# Patient Record
Sex: Male | Born: 1935 | Race: White | Hispanic: Yes | Smoking: Never smoker
Health system: Southern US, Community
[De-identification: ages and names within clinical notes are randomized; demographics above are authoritative.]

## PROBLEM LIST (undated history)

## (undated) DIAGNOSIS — I10 Essential (primary) hypertension: Secondary | ICD-10-CM

## (undated) DIAGNOSIS — I2489 Other forms of acute ischemic heart disease: Secondary | ICD-10-CM

## (undated) DIAGNOSIS — J986 Disorders of diaphragm: Secondary | ICD-10-CM

## (undated) DIAGNOSIS — J9621 Acute and chronic respiratory failure with hypoxia: Secondary | ICD-10-CM

## (undated) DIAGNOSIS — E785 Hyperlipidemia, unspecified: Secondary | ICD-10-CM

## (undated) DIAGNOSIS — R338 Other retention of urine: Secondary | ICD-10-CM

## (undated) DIAGNOSIS — J1100 Influenza due to unidentified influenza virus with unspecified type of pneumonia: Secondary | ICD-10-CM

## (undated) DIAGNOSIS — R071 Chest pain on breathing: Secondary | ICD-10-CM

## (undated) DIAGNOSIS — M1990 Unspecified osteoarthritis, unspecified site: Secondary | ICD-10-CM

## (undated) DIAGNOSIS — G9340 Encephalopathy, unspecified: Secondary | ICD-10-CM

## (undated) DIAGNOSIS — I248 Other forms of acute ischemic heart disease: Secondary | ICD-10-CM

## (undated) DIAGNOSIS — Z955 Presence of coronary angioplasty implant and graft: Secondary | ICD-10-CM

## (undated) DIAGNOSIS — N400 Enlarged prostate without lower urinary tract symptoms: Secondary | ICD-10-CM

## (undated) DIAGNOSIS — J449 Chronic obstructive pulmonary disease, unspecified: Secondary | ICD-10-CM

## (undated) DIAGNOSIS — J189 Pneumonia, unspecified organism: Secondary | ICD-10-CM

## (undated) DIAGNOSIS — I2510 Atherosclerotic heart disease of native coronary artery without angina pectoris: Secondary | ICD-10-CM

## (undated) DIAGNOSIS — I119 Hypertensive heart disease without heart failure: Secondary | ICD-10-CM

## (undated) DIAGNOSIS — R0600 Dyspnea, unspecified: Secondary | ICD-10-CM

## (undated) DIAGNOSIS — J209 Acute bronchitis, unspecified: Secondary | ICD-10-CM

## (undated) HISTORY — DX: Pneumonia, unspecified organism: J18.9

## (undated) HISTORY — DX: Other forms of acute ischemic heart disease: I24.89

## (undated) HISTORY — DX: Other retention of urine: R33.8

## (undated) HISTORY — DX: Chest pain on breathing: R07.1

## (undated) HISTORY — DX: Encephalopathy, unspecified: G93.40

## (undated) HISTORY — DX: Disorders of diaphragm: J98.6

## (undated) HISTORY — DX: Hypertensive heart disease without heart failure: I11.9

## (undated) HISTORY — DX: Hyperlipidemia, unspecified: E78.5

## (undated) HISTORY — DX: Atherosclerotic heart disease of native coronary artery without angina pectoris: I25.10

## (undated) HISTORY — DX: Acute bronchitis, unspecified: J20.9

## (undated) HISTORY — DX: Acute and chronic respiratory failure with hypoxia: J96.21

## (undated) HISTORY — DX: Presence of coronary angioplasty implant and graft: Z95.5

## (undated) HISTORY — DX: Benign prostatic hyperplasia without lower urinary tract symptoms: N40.0

## (undated) HISTORY — DX: Influenza due to unidentified influenza virus with unspecified type of pneumonia: J11.00

## (undated) HISTORY — DX: Other forms of acute ischemic heart disease: I24.8

## (undated) HISTORY — DX: Essential (primary) hypertension: I10

---

## 2001-05-29 ENCOUNTER — Emergency Department (HOSPITAL_COMMUNITY): Admission: EM | Admit: 2001-05-29 | Discharge: 2001-05-29 | Payer: Self-pay | Admitting: Emergency Medicine

## 2001-06-05 ENCOUNTER — Emergency Department (HOSPITAL_COMMUNITY): Admission: EM | Admit: 2001-06-05 | Discharge: 2001-06-05 | Payer: Self-pay | Admitting: Emergency Medicine

## 2001-12-24 ENCOUNTER — Inpatient Hospital Stay (HOSPITAL_COMMUNITY): Admission: EM | Admit: 2001-12-24 | Discharge: 2001-12-28 | Payer: Self-pay | Admitting: *Deleted

## 2001-12-26 ENCOUNTER — Encounter: Payer: Self-pay | Admitting: Cardiovascular Disease

## 2002-01-20 ENCOUNTER — Ambulatory Visit (HOSPITAL_COMMUNITY): Admission: RE | Admit: 2002-01-20 | Discharge: 2002-01-20 | Payer: Self-pay | Admitting: Critical Care Medicine

## 2002-01-20 ENCOUNTER — Encounter: Payer: Self-pay | Admitting: Critical Care Medicine

## 2002-01-24 ENCOUNTER — Ambulatory Visit: Admission: RE | Admit: 2002-01-24 | Discharge: 2002-01-24 | Payer: Self-pay | Admitting: Critical Care Medicine

## 2002-03-23 ENCOUNTER — Inpatient Hospital Stay (HOSPITAL_COMMUNITY): Admission: RE | Admit: 2002-03-23 | Discharge: 2002-03-24 | Payer: Self-pay | Admitting: Neurosurgery

## 2002-03-23 ENCOUNTER — Encounter: Payer: Self-pay | Admitting: Neurosurgery

## 2002-03-25 ENCOUNTER — Emergency Department (HOSPITAL_COMMUNITY): Admission: EM | Admit: 2002-03-25 | Discharge: 2002-03-25 | Payer: Self-pay | Admitting: Emergency Medicine

## 2002-10-21 ENCOUNTER — Ambulatory Visit (HOSPITAL_COMMUNITY): Admission: RE | Admit: 2002-10-21 | Discharge: 2002-10-22 | Payer: Self-pay | Admitting: Cardiovascular Disease

## 2004-12-14 ENCOUNTER — Emergency Department (HOSPITAL_COMMUNITY): Admission: EM | Admit: 2004-12-14 | Discharge: 2004-12-14 | Payer: Self-pay | Admitting: Emergency Medicine

## 2006-07-19 ENCOUNTER — Emergency Department (HOSPITAL_COMMUNITY): Admission: EM | Admit: 2006-07-19 | Discharge: 2006-07-19 | Payer: Self-pay | Admitting: Emergency Medicine

## 2010-03-15 ENCOUNTER — Ambulatory Visit: Payer: Self-pay | Admitting: Cardiovascular Disease

## 2010-08-14 ENCOUNTER — Telehealth: Payer: Self-pay | Admitting: Cardiovascular Disease

## 2010-08-19 ENCOUNTER — Telehealth: Payer: Self-pay | Admitting: Cardiology

## 2010-09-09 ENCOUNTER — Ambulatory Visit (INDEPENDENT_AMBULATORY_CARE_PROVIDER_SITE_OTHER): Payer: BC Managed Care – PPO | Admitting: Cardiovascular Disease

## 2010-09-09 DIAGNOSIS — E7800 Pure hypercholesterolemia, unspecified: Secondary | ICD-10-CM

## 2010-09-09 DIAGNOSIS — Z9861 Coronary angioplasty status: Secondary | ICD-10-CM

## 2010-09-19 ENCOUNTER — Ambulatory Visit: Payer: Self-pay | Admitting: Cardiovascular Disease

## 2010-12-06 NOTE — Op Note (Signed)
[facility]  Patient:    Angel [name], Angel [name] Visit Number: [id number] MRN: [id number]          Service Type: DSU Location: CARD Attending Physician:  Caleb Popp Dictated by:   Charlcie Cradle Delford Field, M.D. [facility] Proc. Date: 01/24/02 Admit Date:  01/24/2002 Discharge Date: 01/24/2002                             Operative Report  PROCEDURE:  Bronchoscopy.  INDICATION:  Right hemidiaphragm elevation, evaluate for endobronchial lesion.  OPERATOR:  Charlcie Cradle. Delford Field, M.D. [facility]  ANESTHESIA:  1% Xylocaine local.  PREOPERATIVE MEDICATIONS:  Versed 4 mg IV push, Demerol 40 mg IV push.  DESCRIPTION OF PROCEDURE:  The Pentax video bronchoscope was introduced through the right naris.  The upper airways were visualized and were unremarkable.  The entire tracheobronchial tree was visualized.  There were no endobronchial lesions seen; however, there was dynamic collapse of the right lower lobe bronchus intermedius and right lower lobe orifices with exhalation. No specimens were obtained.  COMPLICATIONS:  None.  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  No endobronchial lesions seen.  RECOMMENDATIONS:  Followup in the office in one weeks time. Dictated by:   Charlcie Cradle Delford Field, M.D. [facility] Attending Physician:  Caleb Popp DD:  01/24/02 TD:  01/26/02 Job: [id number]

## 2010-12-06 NOTE — Op Note (Signed)
[place]. [facility]  Patient:    Angel [name], Angel [name] Visit Number: [id number] MRN: [id number]          Service Type: MED Location: CCUB 2907 01 Attending Physician:  Koren Bound Dictated by:   Alvia Grove., M.D. Proc. Date: 12/24/01 Admit Date:  12/24/2001 Discharge Date: 12/28/2001   CC:         Charlcie Cradle. Delford Field, M.D. [facility]  Teena Irani. Arlyce Dice, M.D.   Operative Report  DISCHARGE DIAGNOSES: 1. Subendocardial myocardial infarction-anterior wall. 2. Diabetes mellitus. 3. Paralysis of the left hemidiaphragm.  DISCHARGE MEDICATIONS: 1. Enteric-coated aspirin 325 mg a day. 2. Plavix 75 mg a day. 3. Glyburide 5 mg a day. 4. Flomax 0.4 mg a day. 5. Glucophage 500 mg at night-He has to resume on Thursday. 6. Nitroglycerin 0.4 sublingually as needed. 7. Atenolol 25 mg a day.  DISPOSITION:  The patient is to see Dr. Elease Hashimoto in one week.  We will set him up to see a pulmonologist in the near future.  HISTORY OF PRESENT ILLNESS:  Mr. Angel [name] is a 75 -year-old gentleman who presented to the office on December 24, 2001 with chest pain and anterior wall T-wave inversions on EKG.  He was admitted to the hospital for further evaluation.  Please see dictated History and Physical for further details.  HOSPITAL COURSE: #1 - CORONARY ARTERY DISEASE:  Patient was found to have positive cardiac enzymes on admission.  The following day he underwent heart catheterization. He was found to have an extremely tight and calcified proximal LAD lesion. Several days later, the patient underwent rotational arthrectomy and PTCA of the proximal LAD.  The patient tolerated the procedure well and he did not have any episodes of chest pain.  He only had one significant lesion.  He only has minor luminal irregularities in the other coronary arteries.  During the heart catheterization, we did fluoroscopy of the diaphragms.  We performed the sniff test.  He was found to have  paradoxical motion of the left-hemidiaphragm as well as paralysis.  Patient will be discharged on the above-noted medications.  He will see Dr. Elease Hashimoto for evaluation of this chest pain.  We will check fasting lipid profile in the office.  #2 - PARALYZED LEFT HEMIDIAPHRAGM:  The patient had a sniff test in the catheterization laboratory as described above.  He had a CT scan of the chest which revealed no mediastinal adenopathy. He does not report any history of trauma and no motor vehicle accidents.  We will send him to pulmonologist for further evaluation.  There did appear to be some scarring and calcification on the diaphragm.  This is of unknown significance.  #3 - DIABETES MELLITUS:  His glucose levels have been fairly well controlled here in the hospital.  He will return to see Dr. Arlyce Dice for further evaluation of his diabetes mellitus.  DISPOSITION:  The patient had a small to moderate size myocardial infarction. He will return to work on January 10, 2002. Dictated by:   Alvia Grove., M.D. Attending Physician:  Koren Bound DD:  12/28/01 TD:  12/29/01 Job: [id number]

## 2010-12-06 NOTE — H&P (Signed)
NAMEJOSEDE, Angel [name]                              ACCOUNT NO.:  [id number]   MEDICAL RECORD NO.:  [id number]                   PATIENT TYPE:  OIB   LOCATION:                                       FACILITY:  [facility]   PHYSICIAN:  Vesta Mixer, M.D.              DATE OF BIRTH:  July 23, 1935   DATE OF ADMISSION:  10/21/2002  DATE OF DISCHARGE:                                HISTORY & PHYSICAL   HISTORY OF PRESENT ILLNESS:  The patient is a middle-aged gentleman with a  history of coronary artery disease.  He is status post anterior wall  myocardial infarction.  He is status post rotational atherectomy and PTCA of  his LAD last year.  At that time, he was also noted to have a paralyzed left  hemidiaphragm.  Further workup revealed that he had spinal stenosis and he  has had spinal surgery to release the nerve entrapment.   The patient presented today with his sister, who helped interpret.  He  apparently has been having more and more episodes of chest pain.  These  episodes of chest pain are anginal in character.  He has to take  nitroglycerin with as little exercise as just going out to the mailbox.  He  has not had any significant episodes of chest pain at rest.   CURRENT MEDICATIONS:  1. Glucophage 500 mg a day.  2. Glucotrol 5 mg a day.  3. Flomax 0.4 mg a day.  4. Atenolol 25 mg a day.  5. Crestor 10 mg a day.   ALLERGIES:  He has no known drug allergies.   PAST MEDICAL HISTORY:  1. Diabetes mellitus.  2. Coronary artery disease.  3. Hyperlipidemia.  4. Borderline prostatic hypertrophy.   SOCIAL HISTORY:  The patient does not smoke, he does not drink alcohol.   FAMILY HISTORY:  His family history is noncontributory and is noted in the  previous notes.   REVIEW OF SYSTEMS:  His review of systems is essentially normal.   PHYSICAL EXAMINATION:  GENERAL:  He is a middle-aged Hispanic gentleman in  no acute distress.  His weight is 166.  VITAL SIGNS:  His blood pressure is  130/72 with a heart rate of 72.  HEENT/NECK:  Exam reveals 2+ carotids.  He has no bruits.  There is no JVD.  No thyromegaly.  LUNGS:  Lungs are clear to auscultation.  HEART:  Regular rate, S1 and S2.  He has no murmurs, gallops or rubs.  ABDOMEN:  Exam reveals good bowel sounds and is nontender.  EXTREMITIES:  He has no clubbing, cyanosis, or edema.  NEUROLOGIC:  Exam is nonfocal.   ASSESSMENT AND RECOMMENDATION:  The patient presents with episodes of  unstable angina.  He has significant episodes of chest pain with very  minimal exertion.  I have recommended that we proceed with repeat heart  catheterization.  We have discussed the risks, benefits and options of heart  catheterization.  He understands and agrees to proceed.  We will continue  with his same medications otherwise.                                               Vesta Mixer, M.D.    PJN/MEDQ  D:  10/17/2002  T:  10/18/2002  Job:  [id number]   cc:   Teena Irani. Arlyce Dice, M.D.  [address]  [place]  [place] [address]  Fax: [phone]

## 2010-12-06 NOTE — H&P (Signed)
[place]. [name]  Patient:    Angel [name], Angel [name] Visit Number: [id number] MRN: [id number]          Service Type: MED Location: CCUB 2907 01 Attending Physician:  Koren Bound Dictated by:   Alvia Grove., M.D. Admit Date:  12/24/2001   CC:         Tammy R. Collins Scotland, M.D.   History and Physical  HISTORY OF PRESENT ILLNESS:  Angel [name] is a 75 year old Hispanic gentleman with a history of diabetes mellitus.  He presents with new onset angina.  He is admitted for evaluation and management of his angina.  The patient has a two year history of diabetes mellitus.  He has been seen in various urgent care settings.  For the past several days he has had intermittent episodes of angina.  These episodes of angina have been associated with diaphoresis and dyspnea.  He presented to [facility] for further evaluation.  These episodes have occurred with his usual day of activities.  They typically last for 15-20 minutes.  He has not had any syncopal episodes.  He has been able to do any sort of activities because of his chest pains.  At [facility] he was found to have T-wave inversions in the anterior septal leads and was referred to our office for further evaluation.  CURRENT MEDICATIONS: 1. Glucophage 500 mg a day. 2. Glucotrol 5 mg a day. 3. Flomax 0.4 mg a day.  ALLERGIES:  He has no known drug allergies.  PAST MEDICAL HISTORY:  Diabetes mellitus.  SOCIAL HISTORY:  The patient is a nonsmoker.  He does not drink alcohol.  FAMILY HISTORY:  Not significant for any cardiac problems.  REVIEW OF SYSTEMS:  Was reviewed and is essentially negative.  PHYSICAL EXAMINATION:  GENERAL:  He is a middle-aged gentleman in no acute distress.  VITAL SIGNS:  His weight is 175.  His blood pressure is 108/80 with heart rate of 80.  HEENT/NECK:  Revealed 2+ carotids.  He has no bruits.  There is no JVD.  LUNGS:  Reveals  markedly decreased breath sounds on the left side.  His right side is fairly clear.  HEART:  Regular rate, S1, S2.  He has no murmurs, gallops, or rubs.  ABDOMEN:  Reveals good bowel sounds and is nontender.  He has no bruits.  EXTREMITIES:  He has good pulses.  He has no clubbing, cyanosis, or edema.  NEUROLOGIC:  Nonfocal.  His cranial nerves II-XII are intact.  Motor and sensory function are intact.  LABORATORY:  His EKG reveals normal sinus rhythm.  He has T-wave inversions in the anterior leads.  His chest x-ray reveals markedly elevated left hemidiaphragm.  He has cardiomegaly and appears to have enlarged right atrium and right ventricle.  The patient presents with new onset angina.  He has an abnormal EKG consistent with an anteroseptal ischemia or infarction.  He also has some cardiomegaly on chest x-ray as well as a markedly elevated left hemidiaphragm.  We will admit him to the hospital and place him on heparin.  We will proceed with heart catheterization tomorrow morning.  His cardiac enlargement is somewhat of a curiosity.  He does not have any peripheral edema and does not have any significant murmurs to suggest that he has right-sided overload.  He also does not appear to be significantly rotated in a chest x-ray film.  We will consider getting an echocardiogram in the near future. Dictated by:  Alvia Grove., M.D. Attending Physician:  Koren Bound DD:  12/24/01 TD:  12/26/01 Job: [id number]

## 2010-12-06 NOTE — Op Note (Signed)
NAMEKEILON, RESSEL                              ACCOUNT NO.:  [id number]   MEDICAL RECORD NO.:  [id number]                   PATIENT TYPE:  INP   LOCATION:  3007                                 FACILITY:  [facility]   PHYSICIAN:  Hewitt Shorts, M.D.            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   POSTOPERATIVE DIAGNOSIS:  C3-4 cervical stenosis, spondylosis, degenerative  disk disease, and myelopathy.   PROCEDURE:  C3-4 anterior cervical diskectomy and arthrodesis with iliac  crest allograft and Trinica cervical plating.   SURGEON:  Hewitt Shorts, M.D.   ASSISTANT:  Payton Doughty, M.D.   ANESTHESIA:  General endotracheal.   INDICATIONS:  The patient is a 75 year old man who presented with evidence  of myelopathy and hyperreflexia and was found to have stenosis at the C3-4  level due to spondylosis and degenerative disk disease.  A decision was made  to proceed with a single-level anterior cervical diskectomy and arthrodesis.   DESCRIPTION OF PROCEDURE:  The patient was brought to the operating room and  placed under general endotracheal anesthesia.  The patient was placed in 10  pounds of Holter traction.  The neck was prepped with Betadine soap and  solution and draped in a sterile fashion.  A horizontal incision was made on  the left side of the neck, and the line of the incision was infiltrated with  local anesthetic with epinephrine.  The incision was made with a Shaw  scalpel with a temperature of 120.  The incision was carried down through  the subcutaneous tissue and platysma, and then dissection was carried out  through an avascular plane, leaving the sternocleidomastoid, carotid artery,  and jugular vein laterally and the trachea and esophagus medially.  The  ventral aspect of the vertebral column was identified and a localizing x-ray  taken and the C3-4 intervertebral disk space identified.  Diskectomy was  begun with incision in the annulus, continued with microcurettes, pituitary  rongeurs.  The cartilaginous end plates of the corresponding vertebrae were  removed using microcurettes as well as the Micro-Max drill.  The microscope  was draped and brought into the field to provide additional illumination,  magnification, and visualization, and the remainder of the procedure was  performed using microdissection and microsurgical technique.  There was  advanced spondylitic degeneration with large spondylitic osteophytic  overgrowth worse to the right than to the left.  This was removed using the  Micro-Max drill as well as the 2 mm Kerrison punch with thin foot plates.  The posterior longitudinal ligament was spondylitic and was removed as well,  and we were able to decompress the thecal sac.  There was a moderate amount  of epidural bleeding.  This was controlled with Gelfoam soaked in thrombin.  Once the decompression was completed, we proceeded with the arthrodesis.  We  did have to leave some Gelfoam in place to maintain hemostasis from the  epidural veins.  We selected a wedge of iliac crest allograft.  It was cut  and shaped using a Micro-Max drill, and the graft was positioned in the  intervertebral disk space and countersunk.  We then discontinued the  traction, selected the 22 mm Trinica cervical plate.  It was positioned over  the fusion construct and secured to each of the vertebrae with a pair of 4.2  x 14 mm screws.  Each screw hole was drilled and tapped and the screws  placed in an alternating fashion.  Once all four screws were placed and  thoroughly tightened, the locking system was secured.  An x-ray was taken  that showed the graft, plate, and screws all in good position, and the  alignment was good.  We then irrigated the  wound with bacitracin solution,  checked for hemostasis, and proceeded with closure once hemostasis was  established.  The platysma was closed with interrupted, inverted 2-0 undyed  Vicryl sutures, the subcutaneous and subcuticular layer were closed with  interrupted, inverted 3-0 undyed Vicryl sutures, and the skin was  reapproximated with Dermabond.  The patient tolerated the procedure well.  The estimated blood loss was 200 cc.  The sponge and needle count were  correct.  Following surgery the patient was reversed from the anesthetic,  placed in a soft cervical collar, extubated, to be transferred to the  recovery room for further care.                                               Hewitt Shorts, M.D.    RWN/MEDQ  D:  03/23/2002  T:  03/23/2002  Job:  [id number]

## 2010-12-06 NOTE — Procedures (Signed)
[place]. [facility]  Patient:    Angel [name], Angel [name] Visit Number: [id number] MRN: [id number]          Service Type: MED Location: CCUB 2907 01 Attending Physician:  Koren Bound Dictated by:   Alvia Grove., M.D. Proc. Date: 12/27/01 Admit Date:  12/24/2001 Discharge Date: 12/28/2001   CC:         Tammy R. Collins Scotland, M.D., [facility]   Procedure Report  PROCEDURES:  Rotational atherectomy/percutaneous transluminal coronary angioplasty.  HISTORY:  The patient is a 75 year old gentleman who presented with an anterior wall subendocardial myocardial infarction.  He had a diagnostic catheterization several days ago which revealed an extremely tight and heavily calcified proximal LAD stenosis.  He was scheduled for rotational atherectomy today.  PROCEDURAL NOTE:  The right femoral artery was easily cannulated using the modified Seldinger technique.  HEMODYNAMICS:  The aortic pressure is 132/84.  ANGIOGRAPHY: 1. Guiding angiography shots reveal a relatively unremarkable left main.  2. The left anterior descending artery has a proximal 90% stenosis.  This    stenosis is very heavily calcified and is eccentric.  It involves the    first septal artery and the first diagonal artery.  It is very heavily    calcified and the flow through the vessel is fairly hazy.  The distal    vessel and the mid vessel are normal.  ROTATIONAL ATHERECTOMY/ANGIOPLASTY:  The LAD was wired using a Rotafloppy guidewire.  A total of 4100 units of heparin plus an additional 1000 units later in the case was given.  He was already on Integrilin infusion.  The Rotafloppy was placed down into the distal LAD without problems.  A 1.5-mm bur was positioned down the LAD.  We performed a total of 3 runs at a speed of 175,000 rpm.  We then exchanged this bur out for a 2.0 bur.  A total of 3 runs were performed at a speed of 163,000 rpm.  This resulted in a very  nice angiographic lumen with brisk flow.  He tolerated the procedures quite well and did not have any bradycardia.  Flow down the diagonal branch was well preserved.  The Rotafloppy wire was then traded out for a Trooper guidewire. A 2.5 x 15-mm Maverick was positioned across the proximal LAD.  Two inflations were performed:  14 atmospheres for 60 seconds followed by 20 atmospheres for 63 seconds.  This resulted in a very nice angiographic lumen.  There is a smooth transition between the proximal LAD and the mid LAD.  There is some degree of taper which he has in his inherent vessel.  There does not appear to be any disruption, and there is no evidence of edge dissection.  Given the size discrepancy between the proximal LAD and the mid LAD, we have decided not to place a stent.  In addition, this lesion is still fairly heavily calcified and I do not think that we would be able to fully expand a stent.  We will send him back to the unit.  He will receive Integrilin drip for the next 18 hours.  COMPLICATIONS:  None.  CONCLUSIONS:  Successful rotational atherectomy and percutaneous transluminal coronary angioplasty of the proximal left anterior descending artery. Dictated by:   Alvia Grove., M.D. Attending Physician:  Koren Bound DD:  12/27/01 TD:  12/28/01 Job: [id number]

## 2010-12-06 NOTE — Cardiovascular Report (Signed)
Angel [name], Angel [name]                              ACCOUNT NO.:  [id number]   MEDICAL RECORD NO.:  [id number]                   PATIENT TYPE:  OIB   LOCATION:  6533                                 FACILITY:  [facility]   PHYSICIAN:  Vesta Mixer, M.D.              DATE OF BIRTH:  October 19, 1935   DATE OF PROCEDURE:  DATE OF DISCHARGE:  10/22/2002                              CARDIAC CATHETERIZATION   INDICATIONS FOR PROCEDURE:  The patient is a 75 year old gentleman with a  history of coronary artery disease.  He is status post rotablator of his  proximal and mid LAD in June 2003.  He now presents with worsening episodes  of angina.  We have scheduled him for a heart catheterization for further  evaluation.   PROCEDURE:  Left heart catheterization with coronary angiography.   PROCEDURAL NOTE:  The right femoral artery was easily cannulated using the  modified Seldinger technique.   HEMODYNAMICS:  1. The left ventricular pressure was 116/15.  2. Aortic pressure was 116/65.   ANGIOGRAPHY:  1. The left main coronary artery has minor luminal irregularities.  2. The left anterior descending artery has an 80% to 90% stenosis in the     proximal segment.  This stenosis is tightest at the takeoff of the first     septal branch and the diagonal branch.  The mid LAD has minor     irregularities.  3. The first diagonal artery has moderate to severe diffuse disease.  There     is an 80% stenosis in the proximal aspect of this vessel and another 80%     to 90% stenosis in the mid segment.  4. The left circumflex artery is a moderate-sized vessel.  There are no     significant irregularities.  5. The right coronary artery is a moderate-sized vessel.  The posterior     descending artery is fairly normal.  6. The left ventriculogram was performed in the 30-degree RAO position.  It     reveals a fairly well-preserved left ventricular systolic function.  The     ejection fraction is around 50%.   There is inferoapical dyskinesis.   PERCUTANEOUS TRANSLUMINAL CORONARY ANGIOPLASTY PROCEDURE:  The patient was  given 3800 units of heparin followed by an additional 2500 units of heparin  later in the case.  He was also given a double bolus Integrilin drip.  The  left anterior descending artery was wired using an 0.014 BMW wire.  A 3.0 x  28-mm Quantum Monorail was positioned across the stenosis and was inflated  twice:  10 atmospheres for 39 seconds, followed by 14 atmospheres for 34  seconds.  This was followed by a 3.0 x 20-mm Taxus stent.  It was deployed  at 14 atmospheres for 45 seconds.  This resulted in a very nice angiographic  lumen.  At this point, the 3.0 x 20-mm  Quantum was repositioned down across  the stent.  It was inflated up to 18 atmospheres for 28 seconds.  This  resulted in a very nice result.  There was good stent apposition.  The first  diagonal vessel remained moderately compromised but not any worse than it  was at the beginning of the case.  There is severe disease in the first  diagonal vessel, and it is not a good candidate for angioplasty.   COMPLICATIONS:  None.   CONCLUSIONS:  1. Primarily single-vessel coronary artery disease involving the left     anterior descending artery and diagonal systems.  2.     Successful percutaneous transluminal coronary angioplasty and stenting of     the proximal and mid left anterior descending artery.   Will continue with medical therapy.                                               Vesta Mixer, M.D.    PJN/MEDQ  D:  10/21/2002  T:  10/23/2002  Job:  [id number]   cc:   Cardiac Catheterization Lab

## 2010-12-06 NOTE — Cardiovascular Report (Signed)
[place]  Patient:    Angel [name], Angel [name] Visit Number: [id number] MRN: [id number]          Service Type: MED Location: [facility] [id number] Attending Physician:  Koren Bound Dictated by:   Alvia Grove., M.D. Proc. Date: 12/25/01 Admit Date:  12/24/2001   CC:         Cardiac Catheterization Laboratory  Tammy R. Collins Scotland, M.D.  Charlaine Dalton. Sherene Sires, M.D. [facility]   Cardiac Catheterization  INDICATIONS: The patient is a middle-aged, Hispanic male with a recent onset of chest pain. He has a history of diabetes for the past several years. He presented to our office yesterday with chest pain and T wave inversions in the anterior leads. He was admitted to the hospital. He was pain-free at the time. He was found to have positive troponin levels with a relatively normal CPK and mildly elevated CPK MB levels. This was most consistent with a myocardial infarction several days prior. He underwent heart catheterization today for further evaluation.  DESCRIPTION OF PROCEDURE: The right femoral artery was easily cannulated using a modified Seldinger technique.  Because he had been noted to have a markedly elevated left hemidiaphragm, we performed a Sniff test. This Sniff test revealed paradoxic motion of the left hemidiaphragm. The left hemidiaphragm was markedly elevated and appeared to be calcified.  HEMODYNAMICS: The left ventricular pressure was 122/16 with an aortic pressure of 122/63.  ANGIOGRAPHY: Left main: The left main coronary artery has minor luminal irregularities.  Left anterior descending: The left anterior descending artery has a proximal calcified stenosis between 80 and 90%. This lesion tapers and becomes most severe at the origin of the first septal branch and the first diagonal branch. The remainder of the LAD has mild to moderate irregularities with stenoses between 20-25%. The first diagonal vessel has diffuse disease between  70-80%. This diagonal vessel is fairly small and itself would not be a target for angioplasty. The LAD would be a reasonable target for revascularization.  Left circumflex: The left circumflex artery is a moderate sized vessel. There are minor luminal irregularities. The obtuse marginal artery is fairly large and normal.  Right coronary artery: The right coronary artery is a moderate sized vessel and is dominant. There are minor luminal irregularities throughout the course. The posterior descending artery and the posterolateral segment arteries are fairly normal.  LEFT VENTRICULOGRAM: The left ventriculogram was performed in a 30 RAO position. It reveals moderately depressed left ventricular systolic function. There is dyskinesis of the apical segments. There is mild hypokinesis of the anterior apical wall.  There is relatively normal function of the inferior base and the anterior base. There is no significant mitral regurgitation. The ejection fraction is approximately 35-40%.  COMPLICATIONS: None.  CONCLUSIONS: 1. Severe coronary artery disease involving the left anterior descending    artery. We will anticipate performing a rotational atherectomy on    Monday. 2. Moderate left ventricular dysfunction secondary to a subendocardial    anterior wall myocardial infarction. 3. Paralysis of the left hemidiaphragm. There is also a calcified rim of the    hemidiaphragm. This will need to be evaluated by a pulmonologist in the    near future. Dictated by:   Alvia Grove., M.D. Attending Physician:  Koren Bound DD:  12/25/01 TD:  12/28/01 Job: [id number]

## 2010-12-16 ENCOUNTER — Emergency Department (HOSPITAL_COMMUNITY)
Admission: EM | Admit: 2010-12-16 | Discharge: 2010-12-16 | Disposition: A | Discharge status: Home / Self Care | Payer: BC Managed Care – PPO | Attending: Emergency Medicine | Admitting: Emergency Medicine

## 2010-12-16 DIAGNOSIS — I10 Essential (primary) hypertension: Secondary | ICD-10-CM | POA: Insufficient documentation

## 2010-12-16 DIAGNOSIS — Y92009 Unspecified place in unspecified non-institutional (private) residence as the place of occurrence of the external cause: Secondary | ICD-10-CM | POA: Insufficient documentation

## 2010-12-16 DIAGNOSIS — Z23 Encounter for immunization: Secondary | ICD-10-CM | POA: Insufficient documentation

## 2010-12-16 DIAGNOSIS — S61209A Unspecified open wound of unspecified finger without damage to nail, initial encounter: Secondary | ICD-10-CM | POA: Insufficient documentation

## 2010-12-16 DIAGNOSIS — Z7901 Long term (current) use of anticoagulants: Secondary | ICD-10-CM | POA: Insufficient documentation

## 2010-12-16 DIAGNOSIS — E119 Type 2 diabetes mellitus without complications: Secondary | ICD-10-CM | POA: Insufficient documentation

## 2010-12-16 DIAGNOSIS — E785 Hyperlipidemia, unspecified: Secondary | ICD-10-CM | POA: Insufficient documentation

## 2010-12-16 DIAGNOSIS — W278XXA Contact with other nonpowered hand tool, initial encounter: Secondary | ICD-10-CM | POA: Insufficient documentation

## 2011-01-09 ENCOUNTER — Other Ambulatory Visit: Payer: Self-pay | Admitting: Cardiovascular Disease

## 2011-01-09 NOTE — Telephone Encounter (Signed)
Fax received from pharmacy. Refill completed. Jodette Latora Quarry RN  

## 2011-03-31 ENCOUNTER — Encounter: Payer: Self-pay | Admitting: Cardiovascular Disease

## 2011-04-07 ENCOUNTER — Encounter: Payer: Self-pay | Admitting: Cardiovascular Disease

## 2011-04-07 ENCOUNTER — Ambulatory Visit (INDEPENDENT_AMBULATORY_CARE_PROVIDER_SITE_OTHER): Payer: BC Managed Care – PPO | Admitting: Cardiovascular Disease

## 2011-04-07 VITALS — BP 126/74 | HR 64 | Ht 64.0 in | Wt 168.0 lb

## 2011-04-07 DIAGNOSIS — I2510 Atherosclerotic heart disease of native coronary artery without angina pectoris: Secondary | ICD-10-CM

## 2011-04-07 HISTORY — DX: Atherosclerotic heart disease of native coronary artery without angina pectoris: I25.10

## 2011-04-07 MED ORDER — NITROGLYCERIN 0.4 MG SL SUBL: 0.4000 mg | SUBLINGUAL_TABLET | SUBLINGUAL | Status: DC | PRN

## 2011-04-07 MED ORDER — ROSUVASTATIN CALCIUM 20 MG PO TABS: 20.0000 mg | ORAL_TABLET | Freq: Every day | ORAL | Status: DC

## 2011-04-07 MED ORDER — CLOPIDOGREL BISULFATE 75 MG PO TABS: 75.0000 mg | ORAL_TABLET | Freq: Every day | ORAL | Status: DC

## 2011-04-07 MED ORDER — ATENOLOL 25 MG PO TABS: 25.0000 mg | ORAL_TABLET | Freq: Every day | ORAL | Status: DC

## 2011-04-07 MED ORDER — RAMIPRIL 10 MG PO TABS: 10.0000 mg | ORAL_TABLET | Freq: Every day | ORAL | Status: DC

## 2011-04-07 NOTE — Assessment & Plan Note (Addendum)
He is doing very well. We'll see him again in 6 months. We'll check a lipid profile, basic metabolic profile, and HFP at that time.  I've refilled his medications today.

## 2011-04-07 NOTE — Progress Notes (Signed)
Angel [name] Date of Birth  21-Dec-1935 [facility] [address]     [address] [place], [place]  [address] [phone]  Fax  [phone]  History of Present Illness:  Mr. Angel [name] is a 75 year old gentleman with a history of coronary artery disease. He also has a history of a paralyzed left hemidiaphragm. He has done very well since I last saw him. He has not had any episodes of chest pain or shortness of breath.  Current Outpatient Prescriptions on File Prior to Visit  Medication Sig Dispense Refill  . aspirin 81 MG tablet Take 81 mg by mouth daily.        Marland Kitchen atenolol (TENORMIN) 25 MG tablet TAKE ONE TABLET BY MOUTH ONE TIME DAILY  90 tablet  1  . clopidogrel (PLAVIX) 75 MG tablet Take 75 mg by mouth daily.        Marland Kitchen dutasteride (AVODART) 0.5 MG capsule Take 0.5 mg by mouth daily.        Marland Kitchen glipiZIDE (GLUCOTROL) 10 MG tablet Take 10 mg by mouth daily.        . metFORMIN (GLUCOPHAGE) 850 MG tablet Take 850 mg by mouth daily.        . Naproxen Sodium (ALEVE PO) Take by mouth as needed.        . nitroGLYCERIN (NITROSTAT) 0.4 MG SL tablet Place 0.4 mg under the tongue as needed.        . ramipril (ALTACE) 10 MG tablet Take 10 mg by mouth daily.        . Tamsulosin HCl (FLOMAX) 0.4 MG CAPS Take 0.4 mg by mouth daily.          No Known Allergies  Past Medical History  Diagnosis Date  . Hyperlipidemia   . Hypertension   . Benign prostatic hypertrophy   . Diabetes mellitus   . Paralyzed hemidiaphragm     Left  . Coronary artery disease     status post PTCA and stenting of the left anterior descending artery  . Pneumonia     50 years ago    History reviewed. No pertinent past surgical history.  History  Smoking status  . Never Smoker   Smokeless tobacco  . Not on file    History  Alcohol Use  . Yes    History reviewed. No pertinent family history.  Reviw of Systems:  Reviewed in the HPI.  All other systems are negative.  Physical  Exam: BP 126/74  Pulse 64  Ht 5\' 4"  (1.626 m)  Wt 168 lb (76.204 kg)  BMI 28.84 kg/m2 The patient is alert and oriented x 3.  The mood and affect are normal.   Skin: warm and dry.  Color is normal.    HEENT:   the sclera are nonicteric.  The mucous membranes are moist.  The carotids are 2+ without bruits.  There is no thyromegaly.  There is no JVD.    Lungs: clear.  The chest wall is non tender.    Heart: regular rate with a normal S1 and S2.  There are no murmurs, gallops, or rubs. The PMI is not displaced.     Abdomen: good bowel sounds.  There is no guarding or rebound.  There is no hepatosplenomegaly or tenderness.  There are no masses.   Extremities:  no clubbing, cyanosis, or edema.  The legs are without rashes.  The distal pulses are intact.   Neuro:  Cranial nerves II -  XII are intact.  Motor and sensory functions are intact.    The gait is normal.  ECG:  Assessment / Plan:

## 2011-04-11 ENCOUNTER — Other Ambulatory Visit: Payer: Self-pay | Admitting: *Deleted

## 2011-04-11 DIAGNOSIS — I2510 Atherosclerotic heart disease of native coronary artery without angina pectoris: Secondary | ICD-10-CM

## 2011-04-11 MED ORDER — NITROGLYCERIN 0.4 MG SL SUBL: 0.4000 mg | SUBLINGUAL_TABLET | SUBLINGUAL | Status: DC | PRN

## 2011-04-11 NOTE — Telephone Encounter (Signed)
Fax received from pharmacy. Refill completed. Jodette Lequan Dobratz RN  

## 2011-05-12 ENCOUNTER — Encounter: Payer: Self-pay | Admitting: Cardiovascular Disease

## 2011-05-28 ENCOUNTER — Other Ambulatory Visit: Payer: Self-pay | Admitting: *Deleted

## 2011-05-28 NOTE — Telephone Encounter (Signed)
Sent bcbs prior auth for crestor 20 mg daily,

## 2011-06-04 ENCOUNTER — Telehealth: Payer: Self-pay | Admitting: Cardiovascular Disease

## 2011-06-04 DIAGNOSIS — E785 Hyperlipidemia, unspecified: Secondary | ICD-10-CM

## 2011-06-04 MED ORDER — ATORVASTATIN CALCIUM 40 MG PO TABS: 40.0000 mg | ORAL_TABLET | Freq: Every day | ORAL | Status: DC

## 2011-06-04 NOTE — Telephone Encounter (Signed)
Pt's wife called about meds please call her back

## 2011-06-04 NOTE — Telephone Encounter (Signed)
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

## 2011-08-11 ENCOUNTER — Other Ambulatory Visit: Payer: Self-pay | Admitting: *Deleted

## 2011-08-11 DIAGNOSIS — I2510 Atherosclerotic heart disease of native coronary artery without angina pectoris: Secondary | ICD-10-CM

## 2011-08-11 MED ORDER — RAMIPRIL 10 MG PO TABS: 10.0000 mg | ORAL_TABLET | Freq: Every day | ORAL | Status: DC

## 2011-09-01 ENCOUNTER — Other Ambulatory Visit (INDEPENDENT_AMBULATORY_CARE_PROVIDER_SITE_OTHER): Payer: BC Managed Care – PPO | Admitting: *Deleted

## 2011-09-01 DIAGNOSIS — E785 Hyperlipidemia, unspecified: Secondary | ICD-10-CM

## 2011-09-01 LAB — LIPID PANEL
Cholesterol: 135 mg/dL (ref 0–200)
HDL: 48.5 mg/dL (ref >39.00)
LDL Cholesterol: 64 mg/dL (ref 0–99)
Total CHOL/HDL Ratio: 3
Triglycerides: 112 mg/dL (ref 0.0–149.0)
VLDL: 22.4 mg/dL (ref 0.0–40.0)

## 2011-09-01 LAB — HEPATIC FUNCTION PANEL
ALT: 10 U/L (ref 0–53)
AST: 15 U/L (ref 0–37)
Albumin: 4.4 g/dL (ref 3.5–5.2)
Alkaline Phosphatase: 69 U/L (ref 39–117)
Bilirubin, Direct: 0 mg/dL (ref 0.0–0.3)
Total Bilirubin: 0.7 mg/dL (ref 0.3–1.2)
Total Protein: 7 g/dL (ref 6.0–8.3)

## 2011-09-01 LAB — BASIC METABOLIC PANEL
BUN: 20 mg/dL (ref 6–23)
CO2: 30 mEq/L (ref 19–32)
Calcium: 9.5 mg/dL (ref 8.4–10.5)
Chloride: 103 mEq/L (ref 96–112)
Creatinine, Ser: 0.7 mg/dL (ref 0.4–1.5)
GFR: 113.51 mL/min (ref >60.00)
Glucose, Bld: 129 mg/dL — ABNORMAL HIGH (ref 70–99)
Potassium: 4.5 mEq/L (ref 3.5–5.1)
Sodium: 140 mEq/L (ref 135–145)

## 2011-10-06 ENCOUNTER — Encounter: Payer: Self-pay | Admitting: Cardiovascular Disease

## 2011-10-06 ENCOUNTER — Ambulatory Visit (INDEPENDENT_AMBULATORY_CARE_PROVIDER_SITE_OTHER): Payer: BC Managed Care – PPO | Admitting: Cardiovascular Disease

## 2011-10-06 VITALS — BP 112/48 | HR 64 | Resp 20 | Ht 65.0 in | Wt 170.0 lb

## 2011-10-06 DIAGNOSIS — E119 Type 2 diabetes mellitus without complications: Secondary | ICD-10-CM | POA: Insufficient documentation

## 2011-10-06 DIAGNOSIS — E785 Hyperlipidemia, unspecified: Secondary | ICD-10-CM

## 2011-10-06 DIAGNOSIS — I10 Essential (primary) hypertension: Secondary | ICD-10-CM

## 2011-10-06 DIAGNOSIS — I2510 Atherosclerotic heart disease of native coronary artery without angina pectoris: Secondary | ICD-10-CM

## 2011-10-06 DIAGNOSIS — M3390 Dermatopolymyositis, unspecified, organ involvement unspecified: Secondary | ICD-10-CM

## 2011-10-06 HISTORY — DX: Type 2 diabetes mellitus without complications: E11.9

## 2011-10-06 HISTORY — DX: Essential (primary) hypertension: I10

## 2011-10-06 NOTE — Assessment & Plan Note (Addendum)
Angel [name] is doing well.  He's not had any episodes of angina. He is a candidate to participate in the ACCELERATE study.  His most recent lipids are satisfactory. I'll see him again in 6 months for fasting labs and an office visit.

## 2011-10-06 NOTE — Progress Notes (Signed)
Angel [name] Date of Birth  [birth year] [facility]     [place] Office  [address]    [address]   [address] [place], [place]  [address]    [place], [place]  [address] [phone]  Fax  [phone]  [phone]  Fax [phone]   Problems: 1. Coronary artery disease-status post PTCA and stenting of the LAD 2. Paralyzed left hemidiaphragm 3. Diabetes mellitus 4. Hypertension 5. Hyperlipidemia  History of Present Illness:  Angel [name] is a 76 yo with the above noted hx.  No chest pain.  Current Outpatient Prescriptions on File Prior to Visit  Medication Sig Dispense Refill  . aspirin 81 MG tablet Take 81 mg by mouth daily.        Marland Kitchen atenolol (TENORMIN) 25 MG tablet Take 1 tablet (25 mg total) by mouth daily.  90 tablet  1  . atorvastatin (LIPITOR) 40 MG tablet Take 1 tablet (40 mg total) by mouth daily.  30 tablet  5  . clopidogrel (PLAVIX) 75 MG tablet Take 1 tablet (75 mg total) by mouth daily.  90 tablet  3  . dutasteride (AVODART) 0.5 MG capsule Take 0.5 mg by mouth daily.        Marland Kitchen glipiZIDE (GLUCOTROL) 10 MG tablet Take 10 mg by mouth daily.        . metFORMIN (GLUCOPHAGE) 850 MG tablet Take 850 mg by mouth daily.        . Naproxen Sodium (ALEVE PO) Take by mouth as needed.        . nitroGLYCERIN (NITROSTAT) 0.4 MG SL tablet Place 1 tablet (0.4 mg total) under the tongue as needed.  100 tablet  3  . ramipril (ALTACE) 10 MG tablet Take 1 tablet (10 mg total) by mouth daily.  90 tablet  3  . Tamsulosin HCl (FLOMAX) 0.4 MG CAPS Take 0.4 mg by mouth daily.          No Known Allergies  Past Medical History  Diagnosis Date  . Hyperlipidemia   . Hypertension   . Benign prostatic hypertrophy   . Diabetes mellitus   . Paralyzed hemidiaphragm     Left  . Coronary artery disease     status post PTCA and stenting of the left anterior descending artery  . Pneumonia     50 years ago    No past surgical history on file.  History  Smoking status  . Never  Smoker   Smokeless tobacco  . Not on file    History  Alcohol Use  . Yes    No family history on file.  Reviw of Systems:  Reviewed in the HPI.  All other systems are negative.  Physical Exam: Blood pressure 112/48, pulse 64, resp. rate 20, height 5\' 5"  (1.651 m), weight 170 lb (77.111 kg). General: Well developed, well nourished, in no acute distress.  Head: Normocephalic, atraumatic, sclera non-icteric, mucus membranes are moist,   Neck: Supple. Carotids are 2 + without bruits. No JVD  Lungs: no breath sounds on left side  Heart: regular rate.  Soft systolic murmur  Abdomen: Soft, non-tender, non-distended with normal bowel sounds. No hepatomegaly. No rebound/guarding. No masses.  Msk:  Strength and tone are normal  Extremities: No clubbing or cyanosis. No edema.  Distal pedal pulses are 2+ and equal bilaterally.  Neuro: Alert and oriented X 3. Moves all extremities spontaneously.  Psych:  Responds to questions appropriately with a normal affect.  ECG: Normal sinus rhythm.  Normal EKG.  Assessment / Plan:

## 2011-10-06 NOTE — Patient Instructions (Addendum)
Your physician wants you to follow-up in: 6 months  You will receive a reminder letter in the mail two months in advance. If you don't receive a letter, please call our office to schedule the follow-up appointment.  Your physician recommends that you return for a FASTING lipid profile: 6 months  Being counseled for accelerate program.

## 2012-03-01 ENCOUNTER — Other Ambulatory Visit: Payer: Self-pay | Admitting: *Deleted

## 2012-03-01 DIAGNOSIS — E785 Hyperlipidemia, unspecified: Secondary | ICD-10-CM

## 2012-03-01 MED ORDER — ATORVASTATIN CALCIUM 40 MG PO TABS: 40.0000 mg | ORAL_TABLET | Freq: Every day | ORAL | Status: DC

## 2012-03-01 NOTE — Telephone Encounter (Signed)
Fax Received. Refill Completed. Mariabella Nilsen Chowoe (R.M.A)   

## 2012-05-03 ENCOUNTER — Other Ambulatory Visit: Payer: Self-pay | Admitting: *Deleted

## 2012-05-03 ENCOUNTER — Ambulatory Visit (INDEPENDENT_AMBULATORY_CARE_PROVIDER_SITE_OTHER): Payer: BC Managed Care – PPO | Admitting: Cardiovascular Disease

## 2012-05-03 ENCOUNTER — Encounter: Payer: Self-pay | Admitting: Cardiovascular Disease

## 2012-05-03 VITALS — BP 128/70 | HR 59 | Ht 65.0 in | Wt 166.0 lb

## 2012-05-03 DIAGNOSIS — I2510 Atherosclerotic heart disease of native coronary artery without angina pectoris: Secondary | ICD-10-CM

## 2012-05-03 DIAGNOSIS — M3390 Dermatopolymyositis, unspecified, organ involvement unspecified: Secondary | ICD-10-CM

## 2012-05-03 DIAGNOSIS — E785 Hyperlipidemia, unspecified: Secondary | ICD-10-CM

## 2012-05-03 DIAGNOSIS — I10 Essential (primary) hypertension: Secondary | ICD-10-CM

## 2012-05-03 LAB — LIPID PANEL
Cholesterol: 143 mg/dL (ref 0–200)
HDL: 39.2 mg/dL (ref >39.00)
LDL Cholesterol: 68 mg/dL (ref 0–99)
Total CHOL/HDL Ratio: 4
Triglycerides: 178 mg/dL — ABNORMAL HIGH (ref 0.0–149.0)
VLDL: 35.6 mg/dL (ref 0.0–40.0)

## 2012-05-03 LAB — BASIC METABOLIC PANEL
BUN: 18 mg/dL (ref 6–23)
CO2: 33 mEq/L — ABNORMAL HIGH (ref 19–32)
Calcium: 9.5 mg/dL (ref 8.4–10.5)
Chloride: 98 mEq/L (ref 96–112)
Creatinine, Ser: 0.8 mg/dL (ref 0.4–1.5)
GFR: 106.45 mL/min (ref >60.00)
Glucose, Bld: 122 mg/dL — ABNORMAL HIGH (ref 70–99)
Potassium: 5.3 mEq/L — ABNORMAL HIGH (ref 3.5–5.1)
Sodium: 139 mEq/L (ref 135–145)

## 2012-05-03 LAB — HEPATIC FUNCTION PANEL
ALT: 12 U/L (ref 0–53)
AST: 14 U/L (ref 0–37)
Albumin: 4 g/dL (ref 3.5–5.2)
Alkaline Phosphatase: 83 U/L (ref 39–117)
Bilirubin, Direct: 0.1 mg/dL (ref 0.0–0.3)
Total Bilirubin: 1 mg/dL (ref 0.3–1.2)
Total Protein: 6.9 g/dL (ref 6.0–8.3)

## 2012-05-03 MED ORDER — CLOPIDOGREL BISULFATE 75 MG PO TABS: 75.0000 mg | ORAL_TABLET | Freq: Every day | ORAL | Status: DC

## 2012-05-03 NOTE — Assessment & Plan Note (Signed)
Angel [name] is doing well.  He has not had any angina. We'll continue the same medications. I seen again in 6 months for office visit antacid meds. We'll check fasting labs today including lipid profile, hepatic profile, and basic about profile.

## 2012-05-03 NOTE — Progress Notes (Signed)
Angel [name] Date of Birth  19-May-1936 [facility]     [place] Office  [address]    [address]   [address] [place], [place]  [address]    [place], [place]  [address] [phone]  Fax  [phone]  [phone]  Fax [phone]   Problems: 1. Coronary artery disease-status post PTCA and stenting of the LAD 2. Paralyzed left hemidiaphragm 3. Diabetes mellitus 4. Hypertension 5. Hyperlipidemia  History of Present Illness:  Angel [name] is a 76 yo with the above noted hx.  No chest pain.  He remains active .  He's not had any episodes of chest pain or shortness breath. He still exercises on an intermittent basis.  Current Outpatient Prescriptions on File Prior to Visit  Medication Sig Dispense Refill  . aspirin 81 MG tablet Take 81 mg by mouth daily.        Marland Kitchen atenolol (TENORMIN) 25 MG tablet Take 1 tablet (25 mg total) by mouth daily.  90 tablet  1  . atorvastatin (LIPITOR) 40 MG tablet Take 1 tablet (40 mg total) by mouth daily.  30 tablet  5  . clopidogrel (PLAVIX) 75 MG tablet Take 1 tablet (75 mg total) by mouth daily.  90 tablet  3  . dutasteride (AVODART) 0.5 MG capsule Take 0.5 mg by mouth daily.        Marland Kitchen glipiZIDE (GLUCOTROL) 10 MG tablet Take 10 mg by mouth daily.        . metFORMIN (GLUCOPHAGE) 850 MG tablet Take 850 mg by mouth daily.        . Naproxen Sodium (ALEVE PO) Take by mouth as needed.        . nitroGLYCERIN (NITROSTAT) 0.4 MG SL tablet Place 1 tablet (0.4 mg total) under the tongue as needed.  100 tablet  3  . ramipril (ALTACE) 10 MG tablet Take 1 tablet (10 mg total) by mouth daily.  90 tablet  3  . Tamsulosin HCl (FLOMAX) 0.4 MG CAPS Take 0.4 mg by mouth daily.          No Known Allergies  Past Medical History  Diagnosis Date  . Hyperlipidemia   . Hypertension   . Benign prostatic hypertrophy   . Diabetes mellitus   . Paralyzed hemidiaphragm     Left  . Coronary artery disease     status post PTCA and stenting of the left anterior  descending artery  . Pneumonia     50 years ago    No past surgical history on file.  History  Smoking status  . Never Smoker   Smokeless tobacco  . Not on file    History  Alcohol Use  . Yes    No family history on file.  Reviw of Systems:  Reviewed in the HPI.  All other systems are negative.  Physical Exam: Blood pressure 128/70, pulse 59, height 5\' 5"  (1.651 m), weight 166 lb (75.297 kg), SpO2 91.00%. General: Well developed, well nourished, in no acute distress.  Head: Normocephalic, atraumatic, sclera non-icteric, mucus membranes are moist,   Neck: Supple. Carotids are 2 + without bruits. No JVD  Lungs: no breath sounds on left base. He has faint breath sounds in the left upper lung field.  Heart: regular rate.  Soft systolic murmur  Abdomen: Soft, non-tender, non-distended with normal bowel sounds. No hepatomegaly. No rebound/guarding. No masses.  Msk:  Strength and tone are normal  Extremities: No clubbing or cyanosis. No edema.  Distal  pedal pulses are 2+ and equal bilaterally.  Neuro: Alert and oriented X 3. Moves all extremities spontaneously.  Psych:  Responds to questions appropriately with a normal affect.  ECG: Normal sinus rhythm. Normal EKG.  Assessment / Plan:

## 2012-05-03 NOTE — Telephone Encounter (Signed)
Fax Received. Refill Completed. Yahmir Sokolov Chowoe (R.M.A)   

## 2012-05-03 NOTE — Patient Instructions (Addendum)
Your physician recommends that you return for a FASTING lipid profile: today and in 6 months   Your physician wants you to follow-up in: 6 months  You will receive a reminder letter in the mail two months in advance. If you don't receive a letter, please call our office to schedule the follow-up appointment.   

## 2012-05-10 ENCOUNTER — Encounter: Payer: Self-pay | Admitting: Cardiovascular Disease

## 2012-05-13 ENCOUNTER — Other Ambulatory Visit: Payer: Self-pay

## 2012-05-13 DIAGNOSIS — I10 Essential (primary) hypertension: Secondary | ICD-10-CM

## 2012-06-14 ENCOUNTER — Other Ambulatory Visit (INDEPENDENT_AMBULATORY_CARE_PROVIDER_SITE_OTHER): Payer: BC Managed Care – PPO

## 2012-06-14 DIAGNOSIS — I10 Essential (primary) hypertension: Secondary | ICD-10-CM

## 2012-06-14 LAB — BASIC METABOLIC PANEL
BUN: 18 mg/dL (ref 6–23)
CO2: 33 mEq/L — ABNORMAL HIGH (ref 19–32)
Calcium: 9.3 mg/dL (ref 8.4–10.5)
Chloride: 100 mEq/L (ref 96–112)
Creatinine, Ser: 0.7 mg/dL (ref 0.4–1.5)
GFR: 127.45 mL/min (ref >60.00)
Glucose, Bld: 132 mg/dL — ABNORMAL HIGH (ref 70–99)
Potassium: 4.4 mEq/L (ref 3.5–5.1)
Sodium: 138 mEq/L (ref 135–145)

## 2012-07-09 ENCOUNTER — Other Ambulatory Visit: Payer: Self-pay | Admitting: *Deleted

## 2012-07-09 DIAGNOSIS — I2510 Atherosclerotic heart disease of native coronary artery without angina pectoris: Secondary | ICD-10-CM

## 2012-07-09 MED ORDER — NITROGLYCERIN 0.4 MG SL SUBL: 0.4000 mg | SUBLINGUAL_TABLET | SUBLINGUAL | Status: DC | PRN

## 2012-07-09 NOTE — Telephone Encounter (Signed)
Fax Received. Refill Completed. Shukri Nistler Chowoe (R.M.A)   

## 2012-08-05 ENCOUNTER — Other Ambulatory Visit: Payer: Self-pay | Admitting: *Deleted

## 2012-08-05 DIAGNOSIS — I2510 Atherosclerotic heart disease of native coronary artery without angina pectoris: Secondary | ICD-10-CM

## 2012-08-05 MED ORDER — RAMIPRIL 10 MG PO TABS: 10.0000 mg | ORAL_TABLET | Freq: Every day | ORAL | Status: DC

## 2012-08-05 NOTE — Telephone Encounter (Signed)
Fax Received. Refill Completed. Arien Morine Chowoe (R.M.A)   

## 2012-08-27 ENCOUNTER — Other Ambulatory Visit: Payer: Self-pay | Admitting: *Deleted

## 2012-08-27 DIAGNOSIS — E785 Hyperlipidemia, unspecified: Secondary | ICD-10-CM

## 2012-08-27 MED ORDER — ATORVASTATIN CALCIUM 40 MG PO TABS: 40.0000 mg | ORAL_TABLET | Freq: Every day | ORAL | Status: DC

## 2012-08-27 NOTE — Telephone Encounter (Signed)
Fax Received. Refill Completed. Angel [name] (R.M.A)   

## 2012-08-31 ENCOUNTER — Other Ambulatory Visit: Payer: Self-pay | Admitting: *Deleted

## 2012-08-31 NOTE — Telephone Encounter (Signed)
Opened in Error.

## 2012-11-29 ENCOUNTER — Ambulatory Visit (INDEPENDENT_AMBULATORY_CARE_PROVIDER_SITE_OTHER): Payer: BC Managed Care – PPO | Admitting: Cardiovascular Disease

## 2012-11-29 ENCOUNTER — Encounter: Payer: Self-pay | Admitting: Cardiovascular Disease

## 2012-11-29 ENCOUNTER — Other Ambulatory Visit: Payer: BC Managed Care – PPO

## 2012-11-29 VITALS — BP 124/74 | HR 69 | Ht 65.0 in | Wt 163.0 lb

## 2012-11-29 DIAGNOSIS — E785 Hyperlipidemia, unspecified: Secondary | ICD-10-CM

## 2012-11-29 DIAGNOSIS — E119 Type 2 diabetes mellitus without complications: Secondary | ICD-10-CM

## 2012-11-29 DIAGNOSIS — I2510 Atherosclerotic heart disease of native coronary artery without angina pectoris: Secondary | ICD-10-CM

## 2012-11-29 LAB — HEPATIC FUNCTION PANEL
ALT: 15 U/L (ref 0–53)
AST: 16 U/L (ref 0–37)
Albumin: 4.1 g/dL (ref 3.5–5.2)
Alkaline Phosphatase: 92 U/L (ref 39–117)
Bilirubin, Direct: 0 mg/dL (ref 0.0–0.3)
Total Bilirubin: 0.7 mg/dL (ref 0.3–1.2)
Total Protein: 7.1 g/dL (ref 6.0–8.3)

## 2012-11-29 LAB — BASIC METABOLIC PANEL
BUN: 20 mg/dL (ref 6–23)
CO2: 30 mEq/L (ref 19–32)
Calcium: 9 mg/dL (ref 8.4–10.5)
Chloride: 98 mEq/L (ref 96–112)
Creatinine, Ser: 0.8 mg/dL (ref 0.4–1.5)
GFR: 94.69 mL/min (ref >60.00)
Glucose, Bld: 143 mg/dL — ABNORMAL HIGH (ref 70–99)
Potassium: 4.6 mEq/L (ref 3.5–5.1)
Sodium: 137 mEq/L (ref 135–145)

## 2012-11-29 LAB — LIPID PANEL
Cholesterol: 122 mg/dL (ref 0–200)
HDL: 37.2 mg/dL — ABNORMAL LOW (ref >39.00)
LDL Cholesterol: 58 mg/dL (ref 0–99)
Total CHOL/HDL Ratio: 3
Triglycerides: 136 mg/dL (ref 0.0–149.0)
VLDL: 27.2 mg/dL (ref 0.0–40.0)

## 2012-11-29 NOTE — Assessment & Plan Note (Signed)
Continue current meds.  Will check fasting labs again at his next visit in 6 months.

## 2012-11-29 NOTE — Assessment & Plan Note (Signed)
Angel [name] is doing well.  No angina.  He is chronically dyspneac -at least partially due to his left diaphragm paralysis.   He is stable from a cardiac standpoint. We'll check fasting labs today including lipid profile, basic metabolic profile, and hepatic profile. We'll see him again in 6 months for an office visit and repeat fasting labs.

## 2012-11-29 NOTE — Patient Instructions (Addendum)
Your physician recommends that you return for a FASTING lipid profile: TODAY AND IN 6 MONTHS  Your physician wants you to follow-up in: 6 MONTHS  You will receive a reminder letter in the mail two months in advance. If you don't receive a letter, please call our office to schedule the follow-up appointment.   

## 2012-11-29 NOTE — Progress Notes (Signed)
Angel [name] Date of Birth  06/22/1936 [facility]     [place] Office  [address]    [address]   [address] [place], [place]  [address]    [place], [place]  [address] [phone]  Fax  [phone]  [phone]  Fax [phone]   Problems: 1. Coronary artery disease-status post PTCA and stenting of the LAD 2. Paralyzed left hemidiaphragm 3. Diabetes mellitus 4. Hypertension 5. Hyperlipidemia  History of Present Illness:  Angel [name] is a 77 yo with the above noted hx.  No chest pain.  He remains active .  He's not had any episodes of chest pain or shortness breath. He still exercises on an intermittent basis.  Nov 29, 2012:  Angel [name] is doing well. No CP,  Breathing is the same- slightly short of breath.  He is still working 4 days a week.  He has put in a garden this spring - has tomatoes, peppers, etc.   Current Outpatient Prescriptions on File Prior to Visit  Medication Sig Dispense Refill  . aspirin 81 MG tablet Take 81 mg by mouth daily.        Marland Kitchen atenolol (TENORMIN) 25 MG tablet Take 1 tablet (25 mg total) by mouth daily.  90 tablet  1  . atorvastatin (LIPITOR) 40 MG tablet Take 1 tablet (40 mg total) by mouth daily.  30 tablet  5  . clopidogrel (PLAVIX) 75 MG tablet Take 1 tablet (75 mg total) by mouth daily.  90 tablet  3  . dutasteride (AVODART) 0.5 MG capsule Take 0.5 mg by mouth daily.        Marland Kitchen glipiZIDE (GLUCOTROL) 10 MG tablet Take 10 mg by mouth daily.        . metFORMIN (GLUCOPHAGE) 850 MG tablet Take 850 mg by mouth daily.        . Naproxen Sodium (ALEVE PO) Take by mouth as needed.        . nitroGLYCERIN (NITROSTAT) 0.4 MG SL tablet Place 1 tablet (0.4 mg total) under the tongue as needed.  100 tablet  3  . ramipril (ALTACE) 10 MG tablet Take 1 tablet (10 mg total) by mouth daily.  90 tablet  3  . Tamsulosin HCl (FLOMAX) 0.4 MG CAPS Take 0.4 mg by mouth daily.         No current facility-administered medications on file prior to visit.    No  Known Allergies  Past Medical History  Diagnosis Date  . Hyperlipidemia   . Hypertension   . Benign prostatic hypertrophy   . Diabetes mellitus   . Paralyzed hemidiaphragm     Left  . Coronary artery disease     status post PTCA and stenting of the left anterior descending artery  . Pneumonia     50 years ago    No past surgical history on file.  History  Smoking status  . Never Smoker   Smokeless tobacco  . Not on file    History  Alcohol Use  . Yes    No family history on file.  Reviw of Systems:  Reviewed in the HPI.  All other systems are negative.  Physical Exam: Blood pressure 124/74, pulse 69, height 5\' 5"  (1.651 m), weight 163 lb (73.936 kg), SpO2 92.00%. General: Well developed, well nourished, in no acute distress.  Head: Normocephalic, atraumatic, sclera non-icteric, mucus membranes are moist,   Neck: Supple. Carotids are 2 + without bruits. No JVD  Lungs: no breath sounds  on left base. He has faint breath sounds in the left upper lung field.  Heart: regular rate.  Soft systolic murmur  Abdomen: Soft, non-tender, non-distended with normal bowel sounds. No hepatomegaly. No rebound/guarding. No masses.  Msk:  Strength and tone are normal  Extremities: No clubbing or cyanosis. No edema.  Distal pedal pulses are 2+ and equal bilaterally.  Neuro: Alert and oriented X 3. Moves all extremities spontaneously.  Psych:  Responds to questions appropriately with a normal affect.  ECG: Nov 29, 2012:  NSR at 72.  No ST or T wave changes.   Assessment / Plan:

## 2013-02-25 ENCOUNTER — Other Ambulatory Visit: Payer: Self-pay | Admitting: Cardiovascular Disease

## 2013-02-28 NOTE — Telephone Encounter (Signed)
Fax Received. Refill Completed. Davine Sweney Chowoe (R.M.A)   

## 2013-04-26 ENCOUNTER — Other Ambulatory Visit: Payer: Self-pay | Admitting: Nurse Practitioner

## 2013-05-30 ENCOUNTER — Encounter: Payer: Self-pay | Admitting: Cardiovascular Disease

## 2013-05-30 ENCOUNTER — Ambulatory Visit (INDEPENDENT_AMBULATORY_CARE_PROVIDER_SITE_OTHER): Payer: BC Managed Care – PPO | Admitting: Cardiovascular Disease

## 2013-05-30 VITALS — BP 125/69 | HR 75 | Wt 175.0 lb

## 2013-05-30 DIAGNOSIS — I2510 Atherosclerotic heart disease of native coronary artery without angina pectoris: Secondary | ICD-10-CM

## 2013-05-30 DIAGNOSIS — I10 Essential (primary) hypertension: Secondary | ICD-10-CM

## 2013-05-30 DIAGNOSIS — E785 Hyperlipidemia, unspecified: Secondary | ICD-10-CM

## 2013-05-30 DIAGNOSIS — E119 Type 2 diabetes mellitus without complications: Secondary | ICD-10-CM

## 2013-05-30 LAB — BASIC METABOLIC PANEL
BUN: 20 mg/dL (ref 6–23)
CO2: 31 mEq/L (ref 19–32)
Calcium: 9.5 mg/dL (ref 8.4–10.5)
Chloride: 97 mEq/L (ref 96–112)
Creatinine, Ser: 0.8 mg/dL (ref 0.4–1.5)
GFR: 100.04 mL/min (ref >60.00)
Glucose, Bld: 161 mg/dL — ABNORMAL HIGH (ref 70–99)
Potassium: 4.4 mEq/L (ref 3.5–5.1)
Sodium: 136 mEq/L (ref 135–145)

## 2013-05-30 LAB — LIPID PANEL
Cholesterol: 159 mg/dL (ref 0–200)
HDL: 47.2 mg/dL (ref >39.00)
LDL Cholesterol: 86 mg/dL (ref 0–99)
Total CHOL/HDL Ratio: 3
Triglycerides: 130 mg/dL (ref 0.0–149.0)
VLDL: 26 mg/dL (ref 0.0–40.0)

## 2013-05-30 LAB — HEPATIC FUNCTION PANEL
ALT: 18 U/L (ref 0–53)
AST: 18 U/L (ref 0–37)
Albumin: 4.3 g/dL (ref 3.5–5.2)
Alkaline Phosphatase: 99 U/L (ref 39–117)
Bilirubin, Direct: 0.2 mg/dL (ref 0.0–0.3)
Total Bilirubin: 1.1 mg/dL (ref 0.3–1.2)
Total Protein: 7.2 g/dL (ref 6.0–8.3)

## 2013-05-30 NOTE — Patient Instructions (Signed)
Your physician recommends that you return for a FASTING lipid profile: TODAY AND IN 6 MONTHS   Your physician recommends that you continue on your current medications as directed. Please refer to the Current Medication list given to you today.   Your physician wants you to follow-up in: 6 MONTHS  You will receive a reminder letter in the mail two months in advance. If you don't receive a letter, please call our office to schedule the follow-up appointment.

## 2013-05-30 NOTE — Assessment & Plan Note (Signed)
[name] is doing well.  Will check his fasting labs today. Continue the same medications. I seen again in 6 months.

## 2013-05-30 NOTE — Progress Notes (Signed)
Nyra Market Date of Birth  [birth year] [facility]     [place] Office  [address]    [address]   [address] [place], [place]  [address]    [place], [place]  [address] [phone]  Fax  [phone]  [phone]  Fax [phone]   Problems: 1. Coronary artery disease-status post PTCA and stenting of the LAD 2. Paralyzed left hemidiaphragm 3. Diabetes mellitus 4. Hypertension 5. Hyperlipidemia  History of Present Illness:  Jaiyden is a 77 yo with the above noted hx.  No chest pain.  He remains active .  He's not had any episodes of chest pain or shortness breath. He still exercises on an intermittent basis.  Nov 29, 2012:  Abdullah is doing well. No CP,  Breathing is the same- slightly short of breath.  He is still working 4 days a week.  He has put in a garden this spring - has tomatoes, peppers, etc.   Nov. 10, 2014:  Shaarav is doing well.  No angina.    Current Outpatient Prescriptions on File Prior to Visit  Medication Sig Dispense Refill  . aspirin 81 MG tablet Take 81 mg by mouth daily.        Marland Kitchen atenolol (TENORMIN) 25 MG tablet Take 1 tablet (25 mg total) by mouth daily.  90 tablet  1  . atorvastatin (LIPITOR) 40 MG tablet TAKE 1 TABLET BY MOUTH DAILY  30 tablet  5  . clopidogrel (PLAVIX) 75 MG tablet TAKE 1 TABLET BY MOUTH DAILY  90 tablet  3  . dutasteride (AVODART) 0.5 MG capsule Take 0.5 mg by mouth daily.        . metFORMIN (GLUCOPHAGE) 850 MG tablet Take 850 mg by mouth daily.        . Naproxen Sodium (ALEVE PO) Take by mouth as needed.        . nitroGLYCERIN (NITROSTAT) 0.4 MG SL tablet Place 1 tablet (0.4 mg total) under the tongue as needed.  100 tablet  3  . ramipril (ALTACE) 10 MG tablet Take 1 tablet (10 mg total) by mouth daily.  90 tablet  3  . Tamsulosin HCl (FLOMAX) 0.4 MG CAPS Take 0.4 mg by mouth daily.         No current facility-administered medications on file prior to visit.    No Known Allergies  Past Medical History   Diagnosis Date  . Hyperlipidemia   . Hypertension   . Benign prostatic hypertrophy   . Diabetes mellitus   . Paralyzed hemidiaphragm     Left  . Coronary artery disease     status post PTCA and stenting of the left anterior descending artery  . Pneumonia     50 years ago    No past surgical history on file.  History  Smoking status  . Never Smoker   Smokeless tobacco  . Not on file    History  Alcohol Use  . Yes    No family history on file.  Reviw of Systems:  Reviewed in the HPI.  All other systems are negative.  Physical Exam: Blood pressure 125/69, pulse 75, weight 175 lb (79.379 kg). General: Well developed, well nourished, in no acute distress.  Head: Normocephalic, atraumatic, sclera non-icteric, mucus membranes are moist,   Neck: Supple. Carotids are 2 + without bruits. No JVD  Lungs: improved breath sounds on left side. He has faint breath sounds in the left upper lung field.  Right is  clear  Heart: regular rate.   Hyperdynamic heart sonds.  Soft systolic murmur  Abdomen: Soft, non-tender, non-distended with normal bowel sounds. No hepatomegaly. No rebound/guarding. No masses.  Msk:  Strength and tone are normal  Extremities: No clubbing or cyanosis. No edema.  Distal pedal pulses are 2+ and equal bilaterally.  Neuro: Alert and oriented X 3. Moves all extremities spontaneously.  Psych:  Responds to questions appropriately with a normal affect.  ECG: Nov. 10, 2014;  NSR at 75. Normal ECG  Assessment / Plan:

## 2013-05-30 NOTE — Assessment & Plan Note (Signed)
Stable

## 2013-06-01 ENCOUNTER — Telehealth: Payer: Self-pay | Admitting: Cardiology

## 2013-06-01 NOTE — Telephone Encounter (Signed)
Was returning call regarding lab results.  Notified of lab results. She will try to watch diet more carefully.

## 2013-06-01 NOTE — Telephone Encounter (Signed)
New message     Returning nurses call with lab results

## 2013-06-29 ENCOUNTER — Other Ambulatory Visit: Payer: Self-pay | Admitting: Cardiovascular Disease

## 2013-08-15 ENCOUNTER — Other Ambulatory Visit: Payer: Self-pay | Admitting: Cardiovascular Disease

## 2013-12-05 ENCOUNTER — Ambulatory Visit (INDEPENDENT_AMBULATORY_CARE_PROVIDER_SITE_OTHER): Payer: BC Managed Care – PPO | Admitting: Cardiovascular Disease

## 2013-12-05 ENCOUNTER — Encounter: Payer: Self-pay | Admitting: Cardiovascular Disease

## 2013-12-05 VITALS — BP 104/62 | HR 70 | Ht 65.0 in | Wt 159.1 lb

## 2013-12-05 DIAGNOSIS — I2510 Atherosclerotic heart disease of native coronary artery without angina pectoris: Secondary | ICD-10-CM

## 2013-12-05 DIAGNOSIS — E785 Hyperlipidemia, unspecified: Secondary | ICD-10-CM

## 2013-12-05 NOTE — Patient Instructions (Signed)
Your physician recommends that you return for lab work in: 1 week - Monday, June 1 anytime between 7:30 am and 4:30 pm You will need to fast for this appointment - nothing to eat or drink after midnight the night before except water  Your physician wants you to follow-up in: 1 year with fasting lab work on the day of or before your visit.  You will receive a reminder letter in the mail two months in advance. If you don't receive a letter, please call our office to schedule the follow-up appointment.

## 2013-12-05 NOTE — Progress Notes (Signed)
Angel [name] Date of Birth  1935/10/22 [facility]     [place] Office  [address]    [address]   [address] [place], [place]  [id number]    [place], [place]  [id number] [phone]  Fax  [phone]  [phone]  Fax [phone]   Problems: 1. Coronary artery disease-status post PTCA and stenting of the LAD 2. Paralyzed left hemidiaphragm 3. Diabetes mellitus 4. Hypertension 5. Hyperlipidemia  History of Present Illness:  Angel [name] is a 78 yo with the above noted hx.  No chest pain.  He remains active .  He's not had any episodes of chest pain or shortness breath. He still exercises on an intermittent basis.  Nov 29, 2012:  Angel [name] is doing well. No CP,  Breathing is the same- slightly short of breath.  He is still working 4 days a week.  He has put in a garden this spring - has tomatoes, peppers, etc.   Nov. 10, 2014:  Angel [name] is doing well.  No angina.    Dec 05, 2013:  Angel [name] is doing well.  No angina.  Staying active.    Current Outpatient Prescriptions on File Prior to Visit  Medication Sig Dispense Refill  . aspirin 81 MG tablet Take 81 mg by mouth daily.        Marland Kitchen. atenolol (TENORMIN) 25 MG tablet Take 1 tablet (25 mg total) by mouth daily.  90 tablet  1  . atorvastatin (LIPITOR) 40 MG tablet TAKE 1 TABLET BY MOUTH DAILY  30 tablet  6  . clopidogrel (PLAVIX) 75 MG tablet TAKE 1 TABLET BY MOUTH DAILY  90 tablet  3  . dutasteride (AVODART) 0.5 MG capsule Take 0.5 mg by mouth daily.        Marland Kitchen. glipiZIDE-metformin (METAGLIP) 5-500 MG per tablet Take 1 tablet by mouth daily.      . metFORMIN (GLUCOPHAGE) 850 MG tablet Take 850 mg by mouth daily.        . Naproxen Sodium (ALEVE PO) Take by mouth as needed.        . nitroGLYCERIN (NITROSTAT) 0.4 MG SL tablet Place 1 tablet (0.4 mg total) under the tongue as needed.  100 tablet  3  . ramipril (ALTACE) 10 MG capsule TAKE 1 CAPSULE BY MOUTH DAILY  90 capsule  0  . Tamsulosin HCl (FLOMAX) 0.4 MG CAPS Take 0.4 mg by  mouth daily.         No current facility-administered medications on file prior to visit.    No Known Allergies  Past Medical History  Diagnosis Date  . Hyperlipidemia   . Hypertension   . Benign prostatic hypertrophy   . Diabetes mellitus   . Paralyzed hemidiaphragm     Left  . Coronary artery disease     status post PTCA and stenting of the left anterior descending artery  . Pneumonia     50 years ago    No past surgical history on file.  History  Smoking status  . Never Smoker   Smokeless tobacco  . Not on file    History  Alcohol Use  . Yes    No family history on file.  Reviw of Systems:  Reviewed in the HPI.  All other systems are negative.  Physical Exam: Blood pressure 104/62, pulse 70, height 5\' 5"  (1.651 m), weight 159 lb 1.9 oz (72.176 kg). General: Well developed, well nourished, in no acute distress.  Head: Normocephalic, atraumatic, sclera  non-icteric, mucus membranes are moist,   Neck: Supple. Carotids are 2 + without bruits. No JVD  Lungs: improved breath sounds on left side. He has faint breath sounds in the left upper lung field.  No breath sounds in lower left lung field.   Right is clear  Heart: regular rate.   Hyperdynamic heart sonds.  Soft systolic murmur  Abdomen: Soft, non-tender, non-distended with normal bowel sounds. No hepatomegaly. No rebound/guarding. No masses.  Msk:  Strength and tone are normal  Extremities: No clubbing or cyanosis. No edema.  Distal pedal pulses are 2+ and equal bilaterally.  Neuro: Alert and oriented X 3. Moves all extremities spontaneously.  Psych:  Responds to questions appropriately with a normal affect.  ECG: Nov. 10, 2014;  NSR at 75. Normal ECG  Assessment / Plan:

## 2013-12-05 NOTE — Assessment & Plan Note (Signed)
He is doing well.  No angina.   Continue current meds  Will check labs in a week or so.

## 2013-12-05 NOTE — Assessment & Plan Note (Signed)
Will check fasting labs later this month ( not fasting today).  I will see him again in 1 year for OV and fasting labs.

## 2013-12-19 ENCOUNTER — Other Ambulatory Visit (INDEPENDENT_AMBULATORY_CARE_PROVIDER_SITE_OTHER): Payer: BC Managed Care – PPO

## 2013-12-19 DIAGNOSIS — E785 Hyperlipidemia, unspecified: Secondary | ICD-10-CM

## 2013-12-19 LAB — LIPID PANEL
Cholesterol: 111 mg/dL (ref 0–200)
HDL: 42.1 mg/dL (ref >39.00)
LDL Cholesterol: 50 mg/dL (ref 0–99)
Total CHOL/HDL Ratio: 3
Triglycerides: 93 mg/dL (ref 0.0–149.0)
VLDL: 18.6 mg/dL (ref 0.0–40.0)

## 2013-12-19 LAB — BASIC METABOLIC PANEL
BUN: 27 mg/dL — ABNORMAL HIGH (ref 6–23)
CO2: 28 mEq/L (ref 19–32)
Calcium: 9.2 mg/dL (ref 8.4–10.5)
Chloride: 103 mEq/L (ref 96–112)
Creatinine, Ser: 1 mg/dL (ref 0.4–1.5)
GFR: 79.97 mL/min (ref >60.00)
Glucose, Bld: 119 mg/dL — ABNORMAL HIGH (ref 70–99)
Potassium: 4.1 mEq/L (ref 3.5–5.1)
Sodium: 139 mEq/L (ref 135–145)

## 2013-12-19 LAB — HEPATIC FUNCTION PANEL
ALT: 17 U/L (ref 0–53)
AST: 28 U/L (ref 0–37)
Albumin: 4.2 g/dL (ref 3.5–5.2)
Alkaline Phosphatase: 90 U/L (ref 39–117)
Bilirubin, Direct: 0 mg/dL (ref 0.0–0.3)
Total Bilirubin: 0.7 mg/dL (ref 0.2–1.2)
Total Protein: 6.8 g/dL (ref 6.0–8.3)

## 2014-03-16 ENCOUNTER — Other Ambulatory Visit: Payer: Self-pay | Admitting: Cardiovascular Disease

## 2014-04-08 ENCOUNTER — Other Ambulatory Visit: Payer: Self-pay | Admitting: Cardiovascular Disease

## 2014-07-28 ENCOUNTER — Other Ambulatory Visit: Payer: Self-pay | Admitting: Cardiovascular Disease

## 2014-09-14 ENCOUNTER — Other Ambulatory Visit: Payer: Self-pay | Admitting: Cardiovascular Disease

## 2014-09-14 ENCOUNTER — Other Ambulatory Visit: Payer: Self-pay | Admitting: Interventional Cardiology

## 2015-01-05 ENCOUNTER — Other Ambulatory Visit: Payer: Self-pay | Admitting: Nurse Practitioner

## 2015-01-05 DIAGNOSIS — E785 Hyperlipidemia, unspecified: Secondary | ICD-10-CM

## 2015-01-09 ENCOUNTER — Encounter: Payer: Self-pay | Admitting: Cardiovascular Disease

## 2015-01-09 ENCOUNTER — Other Ambulatory Visit (INDEPENDENT_AMBULATORY_CARE_PROVIDER_SITE_OTHER): Payer: BLUE CROSS/BLUE SHIELD | Admitting: *Deleted

## 2015-01-09 ENCOUNTER — Ambulatory Visit (INDEPENDENT_AMBULATORY_CARE_PROVIDER_SITE_OTHER): Payer: BLUE CROSS/BLUE SHIELD | Admitting: Cardiovascular Disease

## 2015-01-09 VITALS — BP 90/56 | HR 64 | Ht 65.0 in | Wt 160.2 lb

## 2015-01-09 DIAGNOSIS — E785 Hyperlipidemia, unspecified: Secondary | ICD-10-CM

## 2015-01-09 DIAGNOSIS — I10 Essential (primary) hypertension: Secondary | ICD-10-CM

## 2015-01-09 DIAGNOSIS — I2510 Atherosclerotic heart disease of native coronary artery without angina pectoris: Secondary | ICD-10-CM | POA: Diagnosis not present

## 2015-01-09 LAB — BASIC METABOLIC PANEL
BUN: 31 mg/dL — ABNORMAL HIGH (ref 6–23)
CO2: 31 mEq/L (ref 19–32)
Calcium: 9.6 mg/dL (ref 8.4–10.5)
Chloride: 98 mEq/L (ref 96–112)
Creatinine, Ser: 1.05 mg/dL (ref 0.40–1.50)
GFR: 72.78 mL/min (ref >60.00)
Glucose, Bld: 131 mg/dL — ABNORMAL HIGH (ref 70–99)
Potassium: 3.9 mEq/L (ref 3.5–5.1)
Sodium: 136 mEq/L (ref 135–145)

## 2015-01-09 LAB — LIPID PANEL
Cholesterol: 152 mg/dL (ref 0–200)
HDL: 38.9 mg/dL — ABNORMAL LOW (ref >39.00)
LDL Cholesterol: 84 mg/dL (ref 0–99)
NonHDL: 113.1
Total CHOL/HDL Ratio: 4
Triglycerides: 147 mg/dL (ref 0.0–149.0)
VLDL: 29.4 mg/dL (ref 0.0–40.0)

## 2015-01-09 LAB — HEPATIC FUNCTION PANEL
ALT: 14 U/L (ref 0–53)
AST: 16 U/L (ref 0–37)
Albumin: 4.3 g/dL (ref 3.5–5.2)
Alkaline Phosphatase: 83 U/L (ref 39–117)
Bilirubin, Direct: 0.1 mg/dL (ref 0.0–0.3)
Total Bilirubin: 0.8 mg/dL (ref 0.2–1.2)
Total Protein: 6.9 g/dL (ref 6.0–8.3)

## 2015-01-09 NOTE — Progress Notes (Signed)
Angel [name] Date of Birth  05-Nov-1935 [facility]     [place] Office  [address]    [address]   [address] [place], [place]  [address]    [place], [place]  [address] [phone]  Fax  [phone]  [phone]  Fax [phone]   Problems: 1. Coronary artery disease-status post PTCA and stenting of the LAD 2. Paralyzed left hemidiaphragm 3. Diabetes mellitus 4. Hypertension 5. Hyperlipidemia  History of Present Illness:  Faheem is a 79 yo with the above noted hx.  No chest pain.  He remains active .  He's not had any episodes of chest pain or shortness breath. He still exercises on an intermittent basis.  Nov 29, 2012:  Kahlen is doing well. No CP,  Breathing is the same- slightly short of breath.  He is still working 4 days a week.  He has put in a garden this spring - has tomatoes, peppers, etc.   Nov. 10, 2014:  Ever is doing well.  No angina.    Dec 05, 2013:  Brain is doing well.  No angina.  Staying active.    January 09, 2015:  Doing well, no episodes of angina. He does all of his normal activities.  Still gardening .    Current Outpatient Prescriptions on File Prior to Visit  Medication Sig Dispense Refill  . aspirin 81 MG tablet Take 81 mg by mouth daily.      Marland Kitchen atenolol (TENORMIN) 25 MG tablet Take 1 tablet (25 mg total) by mouth daily. 90 tablet 1  . atorvastatin (LIPITOR) 40 MG tablet TAKE 1 TABLET BY MOUTH EVERY DAY 30 tablet 6  . clopidogrel (PLAVIX) 75 MG tablet TAKE 1 TABLET BY MOUTH DAILY 90 tablet 1  . CRESTOR 20 MG tablet     . dutasteride (AVODART) 0.5 MG capsule Take 0.5 mg by mouth daily.      Marland Kitchen glipiZIDE-metformin (METAGLIP) 5-500 MG per tablet Take 1 tablet by mouth daily.    . INVOKANA 100 MG TABS Take 100 mg by mouth daily.     . meloxicam (MOBIC) 7.5 MG tablet     . metFORMIN (GLUCOPHAGE) 850 MG tablet Take 850 mg by mouth daily.      . Naproxen Sodium (ALEVE PO) Take by mouth as needed.      . nitroGLYCERIN (NITROSTAT)  0.4 MG SL tablet Place 1 tablet (0.4 mg total) under the tongue as needed. 100 tablet 3  . orphenadrine (NORFLEX) 100 MG tablet     . ramipril (ALTACE) 10 MG capsule TAKE 1 CAPSULE BY MOUTH DAILY 90 capsule 0  . sodium polystyrene (KAYEXALATE) 15 GM/60ML suspension     . Tamsulosin HCl (FLOMAX) 0.4 MG CAPS Take 0.4 mg by mouth daily.      Marland Kitchen tiZANidine (ZANAFLEX) 4 MG tablet      No current facility-administered medications on file prior to visit.    No Known Allergies  Past Medical History  Diagnosis Date  . Hyperlipidemia   . Hypertension   . Benign prostatic hypertrophy   . Diabetes mellitus   . Paralyzed hemidiaphragm     Left  . Coronary artery disease     status post PTCA and stenting of the left anterior descending artery  . Pneumonia     50 years ago    No past surgical history on file.  History  Smoking status  . Never Smoker   Smokeless tobacco  . Not on  file    History  Alcohol Use  . Yes    Family History  Problem Relation Age of Onset  . Cancer Father   . Cancer Brother     Reviw of Systems:  Reviewed in the HPI.  All other systems are negative.  Physical Exam: Blood pressure 90/56, pulse 64, height 5\' 5"  (1.651 m), weight 72.666 kg (160 lb 3.2 oz), SpO2 94 %. General: Well developed, well nourished, in no acute distress.  Head: Normocephalic, atraumatic, sclera non-icteric, mucus membranes are moist,   Neck: Supple. Carotids are 2 + without bruits. No JVD  Lungs:  He has faint breath sounds in the left upper lung field.  No breath sounds in lower left lung field.   Right is clear  Heart: regular rate.   Hyperdynamic heart sonds.  Soft systolic murmur  Abdomen: Soft, non-tender, non-distended with normal bowel sounds. No hepatomegaly. No rebound/guarding. No masses.  Msk:  Strength and tone are normal  Extremities: No clubbing or cyanosis. No edema.  Distal pedal pulses are 2+ and equal bilaterally.  Neuro: Alert and oriented X 3. Moves  all extremities spontaneously.  Psych:  Responds to questions appropriately with a normal affect.  ECG: January 08, 2014:  NSR , no ST or T wave changes.   Assessment / Plan:   1. Coronary artery disease-status post PTCA and stenting of the LAD -  patient is doing well. No changes in medications.  2. Paralyzed left hemidiaphragm 3. Diabetes mellitus 4. Hypertension - BP is well controlled.   5. Hyperlipidemia   - check fasting lipids today. I'll see him again in one year.    Tarra Pence, Deloris Ping, MD  01/09/2015 9:29 AM    [facility] [address] [place],  [address] [place], [place]  [address] Pager [phone] Phone: [phone]; Fax: [phone]   [facility]  [address] [place], [place]  [address] [phone]    Fax [phone]

## 2015-01-09 NOTE — Addendum Note (Signed)
Addended by: Tonita Phoenix on: 01/09/2015 08:04 AM   Modules accepted: Orders

## 2015-01-09 NOTE — Patient Instructions (Signed)
Medication Instructions:  Your physician recommends that you continue on your current medications as directed. Please refer to the Current Medication list given to you today. Call Eligha Bridegroom, RN at [phone] with your pill bottles and list from today's visit to make sure medications are accurate  Labwork: Done today - cholesterol, liver, basic metabolic panel  Testing/Procedures: None Ordered  Follow-Up: Your physician wants you to follow-up in: 1 year with Dr. Elease Hashimoto.  You will receive a reminder letter in the mail two months in advance. If you don't receive a letter, please call our office to schedule the follow-up appointment.

## 2015-01-10 ENCOUNTER — Telehealth: Payer: Self-pay | Admitting: Cardiovascular Disease

## 2015-01-10 NOTE — Telephone Encounter (Signed)
Spoke with patient's wife who called to let us know that the patient's medications are the same as the ones on his AVS given yesterday.  I advised her of patient's lab results and to continue current treatment plan.  She verbalized understanding and agreement.

## 2015-01-10 NOTE — Telephone Encounter (Signed)
New Message      Pt's wife calling to talk to Marcelino Duster about pt's medications that they discussed yesterday. Please call back and advise.

## 2015-01-28 ENCOUNTER — Emergency Department (HOSPITAL_COMMUNITY): Payer: BLUE CROSS/BLUE SHIELD

## 2015-01-28 ENCOUNTER — Emergency Department (HOSPITAL_COMMUNITY)
Admission: EM | Admit: 2015-01-28 | Discharge: 2015-01-28 | Disposition: A | Discharge status: Home / Self Care | Payer: BLUE CROSS/BLUE SHIELD | Attending: Emergency Medicine | Admitting: Emergency Medicine

## 2015-01-28 ENCOUNTER — Encounter (HOSPITAL_COMMUNITY): Payer: Self-pay | Admitting: Emergency Medicine

## 2015-01-28 DIAGNOSIS — E119 Type 2 diabetes mellitus without complications: Secondary | ICD-10-CM | POA: Diagnosis not present

## 2015-01-28 DIAGNOSIS — Z23 Encounter for immunization: Secondary | ICD-10-CM | POA: Insufficient documentation

## 2015-01-28 DIAGNOSIS — Y9389 Activity, other specified: Secondary | ICD-10-CM | POA: Insufficient documentation

## 2015-01-28 DIAGNOSIS — T148XXA Other injury of unspecified body region, initial encounter: Secondary | ICD-10-CM

## 2015-01-28 DIAGNOSIS — Z7901 Long term (current) use of anticoagulants: Secondary | ICD-10-CM | POA: Insufficient documentation

## 2015-01-28 DIAGNOSIS — Y9289 Other specified places as the place of occurrence of the external cause: Secondary | ICD-10-CM | POA: Diagnosis not present

## 2015-01-28 DIAGNOSIS — Z791 Long term (current) use of non-steroidal anti-inflammatories (NSAID): Secondary | ICD-10-CM | POA: Diagnosis not present

## 2015-01-28 DIAGNOSIS — Z79899 Other long term (current) drug therapy: Secondary | ICD-10-CM | POA: Diagnosis not present

## 2015-01-28 DIAGNOSIS — Y998 Other external cause status: Secondary | ICD-10-CM | POA: Insufficient documentation

## 2015-01-28 DIAGNOSIS — S61203A Unspecified open wound of left middle finger without damage to nail, initial encounter: Secondary | ICD-10-CM | POA: Diagnosis not present

## 2015-01-28 DIAGNOSIS — I2510 Atherosclerotic heart disease of native coronary artery without angina pectoris: Secondary | ICD-10-CM | POA: Insufficient documentation

## 2015-01-28 DIAGNOSIS — Z8701 Personal history of pneumonia (recurrent): Secondary | ICD-10-CM | POA: Insufficient documentation

## 2015-01-28 DIAGNOSIS — Z7982 Long term (current) use of aspirin: Secondary | ICD-10-CM | POA: Diagnosis not present

## 2015-01-28 DIAGNOSIS — W28XXXA Contact with powered lawn mower, initial encounter: Secondary | ICD-10-CM | POA: Insufficient documentation

## 2015-01-28 DIAGNOSIS — I10 Essential (primary) hypertension: Secondary | ICD-10-CM | POA: Insufficient documentation

## 2015-01-28 DIAGNOSIS — S61213A Laceration without foreign body of left middle finger without damage to nail, initial encounter: Secondary | ICD-10-CM | POA: Diagnosis present

## 2015-01-28 MED ORDER — TETANUS-DIPHTH-ACELL PERTUSSIS 5-2.5-18.5 LF-MCG/0.5 IM SUSP
0.5000 mL | Freq: Once | INTRAMUSCULAR | Status: AC
Start: 2015-01-28 — End: 2015-01-28
  Administered 2015-01-28: 0.5 mL via INTRAMUSCULAR
  Filled 2015-01-28: qty 0.5

## 2015-01-28 MED ORDER — "THROMBI-PAD 3""X3"" EX PADS"
1.0000 | MEDICATED_PAD | Freq: Once | CUTANEOUS | Status: AC
  Administered 2015-01-28: 1 via TOPICAL
  Filled 2015-01-28: qty 1

## 2015-01-28 NOTE — ED Notes (Addendum)
Pt states he stuck his hand in a lawnmower and now has laceration to left middle finger. Applying pressure with gauze and towel to control bleeding.

## 2015-01-28 NOTE — Discharge Instructions (Signed)
Remove the dressing in 4 days Laceration Care, Adult A laceration is a cut or lesion that goes through all layers of the skin and into the tissue just beneath the skin. TREATMENT  Some lacerations may not require closure. Some lacerations may not be able to be closed due to an increased risk of infection. It is important to see your caregiver as soon as possible after an injury to minimize the risk of infection and maximize the opportunity for successful closure. If closure is appropriate, pain medicines may be given, if needed. The wound will be cleaned to help prevent infection. Your caregiver will use stitches (sutures), staples, wound glue (adhesive), or skin adhesive strips to repair the laceration. These tools bring the skin edges together to allow for faster healing and a better cosmetic outcome. However, all wounds will heal with a scar. Once the wound has healed, scarring can be minimized by covering the wound with sunscreen during the day for 1 full year. HOME CARE INSTRUCTIONS  For sutures or staples:  Keep the wound clean and dry.  If you were given a bandage (dressing), you should change it at least once a day. Also, change the dressing if it becomes wet or dirty, or as directed by your caregiver.  Wash the wound with soap and water 2 times a day. Rinse the wound off with water to remove all soap. Pat the wound dry with a clean towel.  After cleaning, apply a thin layer of the antibiotic ointment as recommended by your caregiver. This will help prevent infection and keep the dressing from sticking.  You may shower as usual after the first 24 hours. Do not soak the wound in water until the sutures are removed.  Only take over-the-counter or prescription medicines for pain, discomfort, or fever as directed by your caregiver.  Get your sutures or staples removed as directed by your caregiver. For skin adhesive strips:  Keep the wound clean and dry.  Do not get the skin adhesive  strips wet. You may bathe carefully, using caution to keep the wound dry.  If the wound gets wet, pat it dry with a clean towel.  Skin adhesive strips will fall off on their own. You may trim the strips as the wound heals. Do not remove skin adhesive strips that are still stuck to the wound. They will fall off in time. For wound adhesive:  You may briefly wet your wound in the shower or bath. Do not soak or scrub the wound. Do not swim. Avoid periods of heavy perspiration until the skin adhesive has fallen off on its own. After showering or bathing, gently pat the wound dry with a clean towel.  Do not apply liquid medicine, cream medicine, or ointment medicine to your wound while the skin adhesive is in place. This may loosen the film before your wound is healed.  If a dressing is placed over the wound, be careful not to apply tape directly over the skin adhesive. This may cause the adhesive to be pulled off before the wound is healed.  Avoid prolonged exposure to sunlight or tanning lamps while the skin adhesive is in place. Exposure to ultraviolet light in the first year will darken the scar.  The skin adhesive will usually remain in place for 5 to 10 days, then naturally fall off the skin. Do not pick at the adhesive film. You may need a tetanus shot if:  You cannot remember when you had your last tetanus shot.  You have never had a tetanus shot. If you get a tetanus shot, your arm may swell, get red, and feel warm to the touch. This is common and not a problem. If you need a tetanus shot and you choose not to have one, there is a rare chance of getting tetanus. Sickness from tetanus can be serious. SEEK MEDICAL CARE IF:   You have redness, swelling, or increasing pain in the wound.  You see a red line that goes away from the wound.  You have yellowish-white fluid (pus) coming from the wound.  You have a fever.  You notice a bad smell coming from the wound or dressing.  Your  wound breaks open before or after sutures have been removed.  You notice something coming out of the wound such as wood or glass.  Your wound is on your hand or foot and you cannot move a finger or toe. SEEK IMMEDIATE MEDICAL CARE IF:   Your pain is not controlled with prescribed medicine.  You have severe swelling around the wound causing pain and numbness or a change in color in your arm, hand, leg, or foot.  Your wound splits open and starts bleeding.  You have worsening numbness, weakness, or loss of function of any joint around or beyond the wound.  You develop painful lumps near the wound or on the skin anywhere on your body. MAKE SURE YOU:   Understand these instructions.  Will watch your condition.  Will get help right away if you are not doing well or get worse. Document Released: 07/07/2005 Document Revised: 09/29/2011 Document Reviewed: 12/31/2010 [facility] Patient Information 2015 [place], [place]. This information is not intended to replace advice given to you by your health care provider. Make sure you discuss any questions you have with your health care provider.

## 2015-01-28 NOTE — ED Provider Notes (Signed)
CSN: [id number]     Arrival date & time 01/28/15  1056 History   First MD Initiated Contact with Patient 01/28/15 1111     Chief Complaint  Patient presents with  . Laceration    left middle     (Consider location/radiation/quality/duration/timing/severity/associated sxs/prior Treatment) HPI Comments: Pt states that he stuck is hand under the lawnmower trying to fix it and he has a laceration to his left middle finger. It happened a short time ago.Denies numbness or weakness. Unsure of last tetanus. States that he couldn't get the area to stop bleeding  The history is provided by the patient. No language interpreter was used.    Past Medical History  Diagnosis Date  . Hyperlipidemia   . Hypertension   . Benign prostatic hypertrophy   . Diabetes mellitus   . Paralyzed hemidiaphragm     Left  . Coronary artery disease     status post PTCA and stenting of the left anterior descending artery  . Pneumonia     50 years ago   History reviewed. No pertinent past surgical history. Family History  Problem Relation Age of Onset  . Cancer Father   . Cancer Brother    History  Substance Use Topics  . Smoking status: Never Smoker   . Smokeless tobacco: Not on file  . Alcohol Use: Yes    Review of Systems  All other systems reviewed and are negative.     Allergies  Review of patient's allergies indicates no known allergies.  Home Medications   Prior to Admission medications   Medication Sig Start Date End Date Taking? Authorizing Provider  aspirin 81 MG tablet Take 81 mg by mouth daily.      Historical Provider, MD  atenolol (TENORMIN) 25 MG tablet Take 1 tablet (25 mg total) by mouth daily. 04/07/11   Vesta MixerPhilip J Nahser, MD  atorvastatin (LIPITOR) 40 MG tablet TAKE 1 TABLET BY MOUTH EVERY DAY 09/14/14   Vesta MixerPhilip J Nahser, MD  clopidogrel (PLAVIX) 75 MG tablet TAKE 1 TABLET BY MOUTH DAILY 07/31/14   Vesta MixerPhilip J Nahser, MD  dutasteride (AVODART) 0.5 MG capsule Take 0.5 mg by mouth  daily.      Historical Provider, MD  glipiZIDE-metformin (METAGLIP) 5-500 MG per tablet Take 1 tablet by mouth daily. 04/08/13   Historical Provider, MD  hydrochlorothiazide (HYDRODIURIL) 25 MG tablet TK 1 T PO QD 12/11/14   Historical Provider, MD  INVOKANA 100 MG TABS Take 100 mg by mouth daily.  11/09/13   Historical Provider, MD  meloxicam (MOBIC) 7.5 MG tablet  11/28/13   Historical Provider, MD  metFORMIN (GLUCOPHAGE) 850 MG tablet Take 850 mg by mouth daily.      Historical Provider, MD  Naproxen Sodium (ALEVE PO) Take by mouth as needed.      Historical Provider, MD  nitroGLYCERIN (NITROSTAT) 0.4 MG SL tablet Place 1 tablet (0.4 mg total) under the tongue as needed. 07/09/12   Vesta MixerPhilip J Nahser, MD  orphenadrine (NORFLEX) 100 MG tablet  11/28/13   Historical Provider, MD  ramipril (ALTACE) 10 MG capsule TAKE 1 CAPSULE BY MOUTH DAILY 06/29/13   Vesta MixerPhilip J Nahser, MD  sodium polystyrene (KAYEXALATE) 15 GM/60ML suspension  12/03/13   Historical Provider, MD  Tamsulosin HCl (FLOMAX) 0.4 MG CAPS Take 0.4 mg by mouth daily.      Historical Provider, MD  tiZANidine (ZANAFLEX) 4 MG tablet  10/18/13   Historical Provider, MD   BP 126/76 mmHg  Pulse 79  Temp(Src) 98.8 F (37.1 C) (Oral)  Resp 18  SpO2 100% Physical Exam  Constitutional: He is oriented to person, place, and time. He appears well-developed and well-nourished.  Cardiovascular: Normal rate and regular rhythm.   Pulmonary/Chest: Effort normal and breath sounds normal.  Neurological: He is alert and oriented to person, place, and time.  Skin: Skin is warm.  Obvious skin avulsion to the distal pad of the left middle finger. Pt has full rom  Nursing note and vitals reviewed.   ED Course  Procedures (including critical care time) Labs Review Labs Reviewed - No data to display  Imaging Review Dg Finger Middle Left  01/28/2015   CLINICAL DATA:  Patient with laceration to the distal middle finger from lawn mower. Initial encounter.   EXAM: LEFT MIDDLE FINGER 2+V  COMPARISON:  None.  FINDINGS: Soft tissue deformity about the distal aspect of the middle finger. No evidence for radiopaque foreign body. Corticated ossific density about the distal tuft of the distal phalanx of the middle finger may be secondary to degenerative change. Underlying osseous injury due to the overlying soft tissue trauma is not excluded.  IMPRESSION: Soft tissue deformity about the distal aspect of middle finger. Corticated ossific densities adjacent to the tuft of the distal aspect of the middle finger may be secondary to underlying osseous injury or potentially degenerative in etiology given the appearance.   Electronically Signed   By: Annia Belt M.D.   On: 01/28/2015 12:26     EKG Interpretation None      MDM   Final diagnoses:  Skin avulsion    Thrombi pad placed and bleeding stopped. No acute bony injury noted. Discussed follow up and return precautions with pt    Teressa Lower, NP 01/28/15 1253  Eber Hong, MD 01/28/15 [phone]

## 2015-04-09 ENCOUNTER — Other Ambulatory Visit: Payer: Self-pay

## 2015-04-09 MED ORDER — ATORVASTATIN CALCIUM 40 MG PO TABS: 40.0000 mg | ORAL_TABLET | Freq: Every day | ORAL | Status: DC

## 2015-04-09 NOTE — Telephone Encounter (Signed)
Angel Mixer, MD at 01/09/2015 9:20 AM  atorvastatin (LIPITOR) 40 MG tablet TAKE 1 TABLET BY MOUTH EVERY DAY 5. Hyperlipidemia - check fasting lipids today. I'll see him again in one year Notes Recorded by Angel Mixer, MD on 01/09/2015 at 5:20 PM Labs look oK

## 2015-04-16 LAB — HM DIABETES EYE EXAM

## 2015-05-22 ENCOUNTER — Encounter: Payer: Self-pay | Admitting: Cardiovascular Disease

## 2015-05-25 ENCOUNTER — Other Ambulatory Visit: Payer: Self-pay | Admitting: *Deleted

## 2015-05-25 ENCOUNTER — Other Ambulatory Visit: Payer: Self-pay | Admitting: Cardiovascular Disease

## 2015-05-25 MED ORDER — CLOPIDOGREL BISULFATE 75 MG PO TABS: 75.0000 mg | ORAL_TABLET | Freq: Every day | ORAL | Status: DC

## 2016-01-11 ENCOUNTER — Other Ambulatory Visit: Payer: Self-pay | Admitting: Cardiovascular Disease

## 2016-01-13 ENCOUNTER — Other Ambulatory Visit: Payer: Self-pay | Admitting: Cardiovascular Disease

## 2016-01-18 ENCOUNTER — Ambulatory Visit: Payer: BLUE CROSS/BLUE SHIELD | Admitting: Cardiovascular Disease

## 2016-02-04 ENCOUNTER — Ambulatory Visit (INDEPENDENT_AMBULATORY_CARE_PROVIDER_SITE_OTHER): Payer: 59 | Admitting: Cardiovascular Disease

## 2016-02-04 ENCOUNTER — Encounter: Payer: Self-pay | Admitting: Cardiovascular Disease

## 2016-02-04 VITALS — BP 92/58 | HR 65 | Ht 65.0 in | Wt 158.1 lb

## 2016-02-04 DIAGNOSIS — I2510 Atherosclerotic heart disease of native coronary artery without angina pectoris: Secondary | ICD-10-CM

## 2016-02-04 DIAGNOSIS — E785 Hyperlipidemia, unspecified: Secondary | ICD-10-CM

## 2016-02-04 DIAGNOSIS — I350 Nonrheumatic aortic (valve) stenosis: Secondary | ICD-10-CM | POA: Diagnosis not present

## 2016-02-04 LAB — LIPID PANEL
Cholesterol: 133 mg/dL (ref 125–200)
HDL: 40 mg/dL (ref >=40)
LDL Cholesterol: 59 mg/dL (ref <130)
Total CHOL/HDL Ratio: 3.3 Ratio (ref <=5.0)
Triglycerides: 168 mg/dL — ABNORMAL HIGH (ref <150)
VLDL: 34 mg/dL — ABNORMAL HIGH (ref <30)

## 2016-02-04 LAB — COMPREHENSIVE METABOLIC PANEL
ALT: 7 U/L — ABNORMAL LOW (ref 9–46)
AST: 11 U/L (ref 10–35)
Albumin: 4.2 g/dL (ref 3.6–5.1)
Alkaline Phosphatase: 122 U/L — ABNORMAL HIGH (ref 40–115)
BUN: 25 mg/dL (ref 7–25)
CO2: 29 mmol/L (ref 20–31)
Calcium: 9.4 mg/dL (ref 8.6–10.3)
Chloride: 96 mmol/L — ABNORMAL LOW (ref 98–110)
Creat: 0.99 mg/dL (ref 0.70–1.18)
Glucose, Bld: 140 mg/dL — ABNORMAL HIGH (ref 65–99)
Potassium: 4.5 mmol/L (ref 3.5–5.3)
Sodium: 135 mmol/L (ref 135–146)
Total Bilirubin: 0.7 mg/dL (ref 0.2–1.2)
Total Protein: 6.6 g/dL (ref 6.1–8.1)

## 2016-02-04 NOTE — Progress Notes (Signed)
Angel MarketLuis A [name] Date of Birth  February 22, 1936 [facility]     [place] Office  [address]    [address]   [address] [place]  [address]    [place], [place]  [id number] [phone]  Fax  [phone]  [phone]  Fax [phone]   Problems: 1. Coronary artery disease-status post PTCA and stenting of the LAD 2. Paralyzed left hemidiaphragm 3. Diabetes mellitus 4. Hypertension 5. Hyperlipidemia  History of Present Illness:  Angel [name] is a 80 yo with the above noted hx.  No chest pain.  He remains active .  He's not had any episodes of chest pain or shortness breath. He still exercises on an intermittent basis.  Nov 29, 2012:  Angel [name] is doing well. No CP,  Breathing is the same- slightly short of breath.  He is still working 4 days a week.  He has put in a garden this spring - has tomatoes, peppers, etc.   Nov. 10, 2014:  Angel [name] is doing well.  No angina.    Dec 05, 2013:  Angel [name] is doing well.  No angina.  Staying active.    January 09, 2015:  Doing well, no episodes of angina. He does all of his normal activities.  Still gardening .    February 04, 2016:  Still working,   Still has his garden . No CP , Breathing is stable    Current Outpatient Prescriptions on File Prior to Visit  Medication Sig Dispense Refill  . aspirin 81 MG tablet Take 81 mg by mouth daily.      Marland Kitchen. atenolol (TENORMIN) 25 MG tablet Take 1 tablet (25 mg total) by mouth daily. 90 tablet 1  . atorvastatin (LIPITOR) 40 MG tablet Take 1 tablet (40 mg total) by mouth daily. 90 tablet 2  . clopidogrel (PLAVIX) 75 MG tablet TAKE 1 TABLET(75 MG) BY MOUTH DAILY 90 tablet 0  . dutasteride (AVODART) 0.5 MG capsule Take 0.5 mg by mouth daily.      Marland Kitchen. glipiZIDE-metformin (METAGLIP) 5-500 MG per tablet Take 1 tablet by mouth daily.    . hydrochlorothiazide (HYDRODIURIL) 25 MG tablet patient takes 1 tablet by mouth daily  5  . INVOKANA 100 MG TABS Take 100 mg by mouth daily.     . Naproxen Sodium (ALEVE  PO) Take by mouth as needed (as needed for pain).     . nitroGLYCERIN (NITROSTAT) 0.4 MG SL tablet Place 1 tablet (0.4 mg total) under the tongue as needed. 100 tablet 3  . orphenadrine (NORFLEX) 100 MG tablet Take 100 mg by mouth 2 (two) times daily as needed for muscle spasms.     . ramipril (ALTACE) 10 MG capsule TAKE 1 CAPSULE BY MOUTH DAILY 90 capsule 0  . Tamsulosin HCl (FLOMAX) 0.4 MG CAPS Take 0.4 mg by mouth daily.       No current facility-administered medications on file prior to visit.    No Known Allergies  Past Medical History  Diagnosis Date  . Hyperlipidemia   . Hypertension   . Benign prostatic hypertrophy   . Diabetes mellitus   . Paralyzed hemidiaphragm     Left  . Coronary artery disease     status post PTCA and stenting of the left anterior descending artery  . Pneumonia     50 years ago    No past surgical history on file.  History  Smoking status  . Never Smoker   Smokeless tobacco  . Not  on file    History  Alcohol Use  . Yes    Family History  Problem Relation Age of Onset  . Cancer Father   . Cancer Brother     Reviw of Systems:  Reviewed in the HPI.  All other systems are negative.  Physical Exam: Blood pressure 92/58, pulse 65, height  (1.651 m), weight 158 lb 1.9 oz (71.723 kg). General: Well developed, well nourished, in no acute distress.  Head: Normocephalic, atraumatic, sclera non-icteric, mucus membranes are moist,   Neck: Supple. Carotids are 2 + without bruits. No JVD  Lungs:  He has faint breath sounds in the left upper lung field.  No breath sounds in lower left lung field.   Right is clear  Heart: regular rate.   Hyperdynamic heart sonds.  1-2/6 systolic murmur  Abdomen: Soft, non-tender, non-distended with normal bowel sounds. No hepatomegaly. No rebound/guarding. No masses.  Msk:  Strength and tone are normal  Extremities: No clubbing or cyanosis. No edema.  Distal pedal pulses are 2+ and equal  bilaterally.  Neuro: Alert and oriented X 3. Moves all extremities spontaneously.  Psych:  Responds to questions appropriately with a normal affect.  ECG: February 04, 2016:  NSR at 67.  LAD    Assessment / Plan:   1. Coronary artery disease-status post PTCA and stenting of the LAD -  patient is doing well. No changes in medications.  2. Paralyzed left hemidiaphragm 3. Diabetes mellitus 4. Hypertension - BP is well controlled. Slightly low today   5. Hyperlipidemia   - check fasting lipids today. I'll see him again in one year.    Kristeen Miss, MD  02/04/2016 9:47 AM    [facility] [address] [place],  [address] [place], [place]  [address] Pager [phone] Phone: [phone]; Fax: [phone]   [facility]  [address] [place], [place]  [address] [phone]    Fax [phone]

## 2016-02-04 NOTE — Patient Instructions (Signed)
Medication Instructions:  Your physician recommends that you continue on your current medications as directed. Please refer to the Current Medication list given to you today.   Labwork: TODAY - cholesterol, complete metabolic panel   Testing/Procedures: Your physician has requested that you have an echocardiogram. Echocardiography is a painless test that uses sound waves to create images of your heart. It provides your doctor with information about the size and shape of your heart and how well your heart's chambers and valves are working. This procedure takes approximately one hour. There are no restrictions for this procedure.    Follow-Up: Your physician wants you to follow-up in: 1 year with Dr. Nahser.  You will receive a reminder letter in the mail two months in advance. If you don't receive a letter, please call our office to schedule the follow-up appointment.   If you need a refill on your cardiac medications before your next appointment, please call your pharmacy.   Thank you for choosing [facility]! Noma Quijas, RN [phone]    

## 2016-02-11 ENCOUNTER — Other Ambulatory Visit: Payer: Self-pay | Admitting: Cardiovascular Disease

## 2016-02-18 ENCOUNTER — Other Ambulatory Visit (HOSPITAL_COMMUNITY): Payer: Self-pay

## 2016-02-18 ENCOUNTER — Ambulatory Visit (HOSPITAL_COMMUNITY): Payer: 59 | Attending: Cardiovascular Disease

## 2016-02-18 DIAGNOSIS — I2510 Atherosclerotic heart disease of native coronary artery without angina pectoris: Secondary | ICD-10-CM | POA: Insufficient documentation

## 2016-02-18 DIAGNOSIS — I119 Hypertensive heart disease without heart failure: Secondary | ICD-10-CM | POA: Insufficient documentation

## 2016-02-18 DIAGNOSIS — E119 Type 2 diabetes mellitus without complications: Secondary | ICD-10-CM | POA: Diagnosis not present

## 2016-02-18 DIAGNOSIS — I350 Nonrheumatic aortic (valve) stenosis: Secondary | ICD-10-CM | POA: Insufficient documentation

## 2016-02-18 LAB — ECHOCARDIOGRAM COMPLETE
E decel time: 169 msec
E/e' ratio: 12.39
FS: 26 % — AB (ref 28–44)
IVS/LV PW RATIO, ED: 0.63
LA ID, A-P, ES: 44 mm
LA diam end sys: 44 mm
LA diam index: 2.41 cm/m2
LA vol A4C: 42.5 ml
LA vol index: 30.4 mL/m2
LA vol: 55.5 mL
LV E/e' medial: 12.39
LV E/e'average: 12.39
LV PW d: 11.7 mm — AB (ref 0.6–1.1)
LV e' LATERAL: 5.44 cm/s
LVOT SV: 46 mL
LVOT VTI: 14.6 cm
LVOT area: 3.14 cm2
LVOT diameter: 20 mm
LVOT peak vel: 67.2 cm/s
Lateral S' vel: 12.4 cm/s
MV Dec: 169
MV pk A vel: 61.1 m/s
MV pk E vel: 67.4 m/s
Peak grad: 136 mmHg
RV sys press: 10 mmHg
Reg peak vel: 136 cm/s
TAPSE: 21.2 mm
TDI e' lateral: 5.44
TDI e' medial: 6.53
TR max vel: 136 cm/s

## 2016-02-26 ENCOUNTER — Telehealth: Payer: Self-pay | Admitting: Cardiovascular Disease

## 2016-02-26 NOTE — Telephone Encounter (Signed)
I spoke with pt's wife and reviewed echo results with her.  

## 2016-02-26 NOTE — Telephone Encounter (Signed)
New message ° ° ° ° °Returning a call to the nurse °

## 2016-03-02 ENCOUNTER — Other Ambulatory Visit: Payer: Self-pay | Admitting: Cardiovascular Disease

## 2016-03-06 ENCOUNTER — Other Ambulatory Visit: Payer: Self-pay | Admitting: Cardiovascular Disease

## 2016-03-06 NOTE — Telephone Encounter (Signed)
clopidogrel (PLAVIX) 75 MG tablet  Medication  Date: 02/12/2016 [facility] [address] Ordering/Authorizing: Vesta MixerPhilip J Nahser, MD  Order Providers   Prescribing Provider Encounter Provider  Vesta MixerPhilip J Nahser, MD Vesta MixerPhilip J Nahser, MD  Medication Detail    Disp Refills Start End   clopidogrel (PLAVIX) 75 MG tablet 90 tablet 3 02/12/2016    Sig: TAKE 1 TABLET(75 MG) BY MOUTH DAILY   Notes to Pharmacy: **Patient requests 90 days supply**   E-Prescribing Status: Receipt confirmed by pharmacy (02/12/2016 11:33 AM EDT)   Pharmacy   [facility] [id number] - [place], [place] - [address] [facility] [address] [address]

## 2016-03-07 ENCOUNTER — Other Ambulatory Visit: Payer: Self-pay | Admitting: Cardiovascular Disease

## 2016-03-10 ENCOUNTER — Telehealth: Payer: Self-pay | Admitting: Cardiovascular Disease

## 2016-03-10 MED ORDER — CLOPIDOGREL BISULFATE 75 MG PO TABS: 75.0000 mg | ORAL_TABLET | Freq: Once | ORAL | 3 refills | Status: DC

## 2016-03-10 NOTE — Telephone Encounter (Signed)
New message      Plavix was not refilled but it was denied stating that it had already been responded to but Maralyn SagoSarah stated she never received it.     *STAT* If patient is at the pharmacy, call can be transferred to refill team.   1. Which medications need to be refilled? (please list name of each medication and dose if known) Plavix 75mg   2. Which pharmacy/location (including street and city if local pharmacy) is medication to be sent to?[facility]  3. Do they need a 30 day or 90 day supply? 30

## 2016-03-10 NOTE — Telephone Encounter (Signed)
[facility] who states Plavix Rx from 7/25 was never received.  I advised I am resending and to please let me know today if she does not receive.  She verbalized understanding and agreement.

## 2016-04-01 ENCOUNTER — Other Ambulatory Visit: Payer: Self-pay | Admitting: Cardiovascular Disease

## 2016-04-05 ENCOUNTER — Other Ambulatory Visit: Payer: Self-pay | Admitting: Cardiovascular Disease

## 2016-04-06 ENCOUNTER — Other Ambulatory Visit: Payer: Self-pay | Admitting: Cardiovascular Disease

## 2016-04-10 ENCOUNTER — Other Ambulatory Visit: Payer: Self-pay | Admitting: Cardiovascular Disease

## 2016-08-11 ENCOUNTER — Emergency Department (HOSPITAL_COMMUNITY): Payer: Commercial Managed Care - HMO

## 2016-08-11 ENCOUNTER — Encounter (HOSPITAL_COMMUNITY): Payer: Self-pay | Admitting: Emergency Medicine

## 2016-08-11 ENCOUNTER — Inpatient Hospital Stay (HOSPITAL_COMMUNITY)
Admission: EM | Admit: 2016-08-11 | Discharge: 2016-08-20 | DRG: 871 | Disposition: A | Discharge status: Home Health | Payer: Commercial Managed Care - HMO | Attending: Internal Medicine | Admitting: Internal Medicine

## 2016-08-11 DIAGNOSIS — I2510 Atherosclerotic heart disease of native coronary artery without angina pectoris: Secondary | ICD-10-CM | POA: Diagnosis present

## 2016-08-11 DIAGNOSIS — Z7902 Long term (current) use of antithrombotics/antiplatelets: Secondary | ICD-10-CM

## 2016-08-11 DIAGNOSIS — Z955 Presence of coronary angioplasty implant and graft: Secondary | ICD-10-CM | POA: Diagnosis not present

## 2016-08-11 DIAGNOSIS — J9811 Atelectasis: Secondary | ICD-10-CM | POA: Diagnosis not present

## 2016-08-11 DIAGNOSIS — J101 Influenza due to other identified influenza virus with other respiratory manifestations: Secondary | ICD-10-CM | POA: Diagnosis present

## 2016-08-11 DIAGNOSIS — R000 Tachycardia, unspecified: Secondary | ICD-10-CM | POA: Diagnosis present

## 2016-08-11 DIAGNOSIS — D649 Anemia, unspecified: Secondary | ICD-10-CM | POA: Diagnosis not present

## 2016-08-11 DIAGNOSIS — J432 Centrilobular emphysema: Secondary | ICD-10-CM | POA: Diagnosis present

## 2016-08-11 DIAGNOSIS — I350 Nonrheumatic aortic (valve) stenosis: Secondary | ICD-10-CM | POA: Diagnosis present

## 2016-08-11 DIAGNOSIS — D72829 Elevated white blood cell count, unspecified: Secondary | ICD-10-CM | POA: Diagnosis present

## 2016-08-11 DIAGNOSIS — Z7984 Long term (current) use of oral hypoglycemic drugs: Secondary | ICD-10-CM

## 2016-08-11 DIAGNOSIS — J9601 Acute respiratory failure with hypoxia: Secondary | ICD-10-CM | POA: Diagnosis not present

## 2016-08-11 DIAGNOSIS — N400 Enlarged prostate without lower urinary tract symptoms: Secondary | ICD-10-CM | POA: Diagnosis present

## 2016-08-11 DIAGNOSIS — R071 Chest pain on breathing: Secondary | ICD-10-CM

## 2016-08-11 DIAGNOSIS — I5033 Acute on chronic diastolic (congestive) heart failure: Secondary | ICD-10-CM | POA: Diagnosis not present

## 2016-08-11 DIAGNOSIS — J1100 Influenza due to unidentified influenza virus with unspecified type of pneumonia: Secondary | ICD-10-CM | POA: Diagnosis not present

## 2016-08-11 DIAGNOSIS — K449 Diaphragmatic hernia without obstruction or gangrene: Secondary | ICD-10-CM | POA: Diagnosis not present

## 2016-08-11 DIAGNOSIS — N179 Acute kidney failure, unspecified: Secondary | ICD-10-CM | POA: Diagnosis not present

## 2016-08-11 DIAGNOSIS — J209 Acute bronchitis, unspecified: Secondary | ICD-10-CM | POA: Diagnosis not present

## 2016-08-11 DIAGNOSIS — Z7982 Long term (current) use of aspirin: Secondary | ICD-10-CM | POA: Diagnosis not present

## 2016-08-11 DIAGNOSIS — I5031 Acute diastolic (congestive) heart failure: Secondary | ICD-10-CM | POA: Diagnosis not present

## 2016-08-11 DIAGNOSIS — R9431 Abnormal electrocardiogram [ECG] [EKG]: Secondary | ICD-10-CM | POA: Diagnosis not present

## 2016-08-11 DIAGNOSIS — J111 Influenza due to unidentified influenza virus with other respiratory manifestations: Secondary | ICD-10-CM | POA: Diagnosis not present

## 2016-08-11 DIAGNOSIS — I10 Essential (primary) hypertension: Secondary | ICD-10-CM | POA: Diagnosis present

## 2016-08-11 DIAGNOSIS — R6520 Severe sepsis without septic shock: Secondary | ICD-10-CM | POA: Diagnosis present

## 2016-08-11 DIAGNOSIS — J9621 Acute and chronic respiratory failure with hypoxia: Secondary | ICD-10-CM | POA: Diagnosis not present

## 2016-08-11 DIAGNOSIS — A419 Sepsis, unspecified organism: Secondary | ICD-10-CM | POA: Diagnosis not present

## 2016-08-11 DIAGNOSIS — R0602 Shortness of breath: Secondary | ICD-10-CM | POA: Diagnosis not present

## 2016-08-11 DIAGNOSIS — I2729 Other secondary pulmonary hypertension: Secondary | ICD-10-CM | POA: Diagnosis present

## 2016-08-11 DIAGNOSIS — I119 Hypertensive heart disease without heart failure: Secondary | ICD-10-CM | POA: Diagnosis not present

## 2016-08-11 DIAGNOSIS — G9340 Encephalopathy, unspecified: Secondary | ICD-10-CM | POA: Diagnosis not present

## 2016-08-11 DIAGNOSIS — R0600 Dyspnea, unspecified: Secondary | ICD-10-CM | POA: Diagnosis not present

## 2016-08-11 DIAGNOSIS — E785 Hyperlipidemia, unspecified: Secondary | ICD-10-CM | POA: Diagnosis present

## 2016-08-11 DIAGNOSIS — J9690 Respiratory failure, unspecified, unspecified whether with hypoxia or hypercapnia: Secondary | ICD-10-CM

## 2016-08-11 DIAGNOSIS — N401 Enlarged prostate with lower urinary tract symptoms: Secondary | ICD-10-CM | POA: Diagnosis present

## 2016-08-11 DIAGNOSIS — R0902 Hypoxemia: Secondary | ICD-10-CM

## 2016-08-11 DIAGNOSIS — J441 Chronic obstructive pulmonary disease with (acute) exacerbation: Secondary | ICD-10-CM | POA: Diagnosis present

## 2016-08-11 DIAGNOSIS — Z79899 Other long term (current) drug therapy: Secondary | ICD-10-CM

## 2016-08-11 DIAGNOSIS — J986 Disorders of diaphragm: Secondary | ICD-10-CM | POA: Diagnosis not present

## 2016-08-11 DIAGNOSIS — J45901 Unspecified asthma with (acute) exacerbation: Secondary | ICD-10-CM | POA: Diagnosis present

## 2016-08-11 DIAGNOSIS — R05 Cough: Secondary | ICD-10-CM | POA: Diagnosis not present

## 2016-08-11 DIAGNOSIS — Z791 Long term (current) use of non-steroidal anti-inflammatories (NSAID): Secondary | ICD-10-CM

## 2016-08-11 DIAGNOSIS — I248 Other forms of acute ischemic heart disease: Secondary | ICD-10-CM | POA: Diagnosis not present

## 2016-08-11 DIAGNOSIS — Z809 Family history of malignant neoplasm, unspecified: Secondary | ICD-10-CM

## 2016-08-11 DIAGNOSIS — E7800 Pure hypercholesterolemia, unspecified: Secondary | ICD-10-CM | POA: Diagnosis not present

## 2016-08-11 DIAGNOSIS — E876 Hypokalemia: Secondary | ICD-10-CM | POA: Diagnosis not present

## 2016-08-11 DIAGNOSIS — E1165 Type 2 diabetes mellitus with hyperglycemia: Secondary | ICD-10-CM | POA: Diagnosis present

## 2016-08-11 DIAGNOSIS — F22 Delusional disorders: Secondary | ICD-10-CM | POA: Diagnosis not present

## 2016-08-11 DIAGNOSIS — E872 Acidosis: Secondary | ICD-10-CM | POA: Diagnosis not present

## 2016-08-11 DIAGNOSIS — E118 Type 2 diabetes mellitus with unspecified complications: Secondary | ICD-10-CM

## 2016-08-11 DIAGNOSIS — R338 Other retention of urine: Secondary | ICD-10-CM

## 2016-08-11 DIAGNOSIS — E119 Type 2 diabetes mellitus without complications: Secondary | ICD-10-CM

## 2016-08-11 DIAGNOSIS — T380X5A Adverse effect of glucocorticoids and synthetic analogues, initial encounter: Secondary | ICD-10-CM | POA: Diagnosis not present

## 2016-08-11 DIAGNOSIS — J4541 Moderate persistent asthma with (acute) exacerbation: Secondary | ICD-10-CM | POA: Diagnosis not present

## 2016-08-11 DIAGNOSIS — J9600 Acute respiratory failure, unspecified whether with hypoxia or hypercapnia: Secondary | ICD-10-CM | POA: Diagnosis not present

## 2016-08-11 DIAGNOSIS — I110 Hypertensive heart disease with heart failure: Secondary | ICD-10-CM | POA: Diagnosis not present

## 2016-08-11 DIAGNOSIS — J942 Hemothorax: Secondary | ICD-10-CM | POA: Diagnosis not present

## 2016-08-11 DIAGNOSIS — J810 Acute pulmonary edema: Secondary | ICD-10-CM | POA: Diagnosis not present

## 2016-08-11 DIAGNOSIS — J09X2 Influenza due to identified novel influenza A virus with other respiratory manifestations: Secondary | ICD-10-CM | POA: Diagnosis not present

## 2016-08-11 LAB — CBC WITH DIFFERENTIAL/PLATELET
Basophils Absolute: 0 10*3/uL (ref 0.0–0.1)
Basophils Relative: 0 %
Eosinophils Absolute: 0 10*3/uL (ref 0.0–0.7)
Eosinophils Relative: 0 %
HCT: 41.9 % (ref 39.0–52.0)
Hemoglobin: 13.8 g/dL (ref 13.0–17.0)
Lymphocytes Relative: 8 %
Lymphs Abs: 1 10*3/uL (ref 0.7–4.0)
MCH: 30.3 pg (ref 26.0–34.0)
MCHC: 32.9 g/dL (ref 30.0–36.0)
MCV: 92.1 fL (ref 78.0–100.0)
Monocytes Absolute: 0.9 10*3/uL (ref 0.1–1.0)
Monocytes Relative: 7 %
Neutro Abs: 10.6 10*3/uL — ABNORMAL HIGH (ref 1.7–7.7)
Neutrophils Relative %: 85 %
Platelets: 201 10*3/uL (ref 150–400)
RBC: 4.55 MIL/uL (ref 4.22–5.81)
RDW: 13.8 % (ref 11.5–15.5)
WBC: 12.6 10*3/uL — ABNORMAL HIGH (ref 4.0–10.5)

## 2016-08-11 LAB — URINALYSIS, ROUTINE W REFLEX MICROSCOPIC
Bacteria, UA: NONE SEEN
Bilirubin Urine: NEGATIVE
Glucose, UA: 50 mg/dL — AB
Ketones, ur: 20 mg/dL — AB
Leukocytes, UA: NEGATIVE
Nitrite: NEGATIVE
Protein, ur: 30 mg/dL — AB
Specific Gravity, Urine: 1.021 (ref 1.005–1.030)
Squamous Epithelial / LPF: NONE SEEN
pH: 5 (ref 5.0–8.0)

## 2016-08-11 LAB — INFLUENZA PANEL BY PCR (TYPE A & B)
Influenza A By PCR: POSITIVE — AB
Influenza B By PCR: NEGATIVE

## 2016-08-11 LAB — BASIC METABOLIC PANEL
Anion gap: 13 (ref 5–15)
BUN: 19 mg/dL (ref 6–20)
CO2: 25 mmol/L (ref 22–32)
Calcium: 8.1 mg/dL — ABNORMAL LOW (ref 8.9–10.3)
Chloride: 100 mmol/L — ABNORMAL LOW (ref 101–111)
Creatinine, Ser: 1.25 mg/dL — ABNORMAL HIGH (ref 0.61–1.24)
GFR calc Af Amer: >60 mL/min (ref >60)
GFR calc non Af Amer: 53 mL/min — ABNORMAL LOW (ref >60)
Glucose, Bld: 348 mg/dL — ABNORMAL HIGH (ref 65–99)
Potassium: 3.2 mmol/L — ABNORMAL LOW (ref 3.5–5.1)
Sodium: 138 mmol/L (ref 135–145)

## 2016-08-11 LAB — LACTIC ACID, PLASMA
Lactic Acid, Venous: 8.6 mmol/L (ref 0.5–1.9)
Lactic Acid, Venous: 6.6 mmol/L (ref 0.5–1.9)

## 2016-08-11 LAB — BRAIN NATRIURETIC PEPTIDE: B Natriuretic Peptide: 174.6 pg/mL — ABNORMAL HIGH (ref 0.0–100.0)

## 2016-08-11 LAB — PROCALCITONIN: Procalcitonin: 0.3 ng/mL

## 2016-08-11 LAB — BLOOD GAS, ARTERIAL
Acid-base deficit: 2.5 mmol/L — ABNORMAL HIGH (ref 0.0–2.0)
Bicarbonate: 23.5 mmol/L (ref 20.0–28.0)
Drawn by: 235321
O2 Content: 4 L/min
O2 Saturation: 90.8 %
Patient temperature: 98.6
pCO2 arterial: 48.5 mmHg — ABNORMAL HIGH (ref 32.0–48.0)
pH, Arterial: 7.307 — ABNORMAL LOW (ref 7.350–7.450)
pO2, Arterial: 68.1 mmHg — ABNORMAL LOW (ref 83.0–108.0)

## 2016-08-11 LAB — CBC
HCT: 40.7 % (ref 39.0–52.0)
Hemoglobin: 13.1 g/dL (ref 13.0–17.0)
MCH: 29.9 pg (ref 26.0–34.0)
MCHC: 32.2 g/dL (ref 30.0–36.0)
MCV: 92.9 fL (ref 78.0–100.0)
Platelets: 199 10*3/uL (ref 150–400)
RBC: 4.38 MIL/uL (ref 4.22–5.81)
RDW: 14.1 % (ref 11.5–15.5)
WBC: 14.4 10*3/uL — ABNORMAL HIGH (ref 4.0–10.5)

## 2016-08-11 LAB — TROPONIN I: Troponin I: 0.45 ng/mL (ref <0.03)

## 2016-08-11 LAB — GLUCOSE, CAPILLARY: Glucose-Capillary: 313 mg/dL — ABNORMAL HIGH (ref 65–99)

## 2016-08-11 LAB — APTT: aPTT: 24 seconds (ref 24–36)

## 2016-08-11 LAB — PHOSPHORUS: Phosphorus: 2.8 mg/dL (ref 2.5–4.6)

## 2016-08-11 LAB — I-STAT CG4 LACTIC ACID, ED
Lactic Acid, Venous: 2.52 mmol/L (ref 0.5–1.9)
Lactic Acid, Venous: 9.02 mmol/L (ref 0.5–1.9)
Lactic Acid, Venous: 9.42 mmol/L (ref 0.5–1.9)

## 2016-08-11 LAB — COMPREHENSIVE METABOLIC PANEL
ALT: 16 U/L — ABNORMAL LOW (ref 17–63)
AST: 25 U/L (ref 15–41)
Albumin: 4.7 g/dL (ref 3.5–5.0)
Alkaline Phosphatase: 96 U/L (ref 38–126)
Anion gap: 10 (ref 5–15)
BUN: 17 mg/dL (ref 6–20)
CO2: 33 mmol/L — ABNORMAL HIGH (ref 22–32)
Calcium: 9.8 mg/dL (ref 8.9–10.3)
Chloride: 96 mmol/L — ABNORMAL LOW (ref 101–111)
Creatinine, Ser: 0.93 mg/dL (ref 0.61–1.24)
GFR calc Af Amer: >60 mL/min (ref >60)
GFR calc non Af Amer: >60 mL/min (ref >60)
Glucose, Bld: 203 mg/dL — ABNORMAL HIGH (ref 65–99)
Potassium: 4.6 mmol/L (ref 3.5–5.1)
Sodium: 139 mmol/L (ref 135–145)
Total Bilirubin: 1.2 mg/dL (ref 0.3–1.2)
Total Protein: 7.5 g/dL (ref 6.5–8.1)

## 2016-08-11 LAB — MAGNESIUM: Magnesium: 1.1 mg/dL — ABNORMAL LOW (ref 1.7–2.4)

## 2016-08-11 LAB — CREATININE, SERUM
Creatinine, Ser: 1.28 mg/dL — ABNORMAL HIGH (ref 0.61–1.24)
GFR calc Af Amer: >60 mL/min (ref >60)
GFR calc non Af Amer: 52 mL/min — ABNORMAL LOW (ref >60)

## 2016-08-11 LAB — MRSA PCR SCREENING: MRSA by PCR: NEGATIVE

## 2016-08-11 LAB — I-STAT TROPONIN, ED: Troponin i, poc: 0.02 ng/mL (ref 0.00–0.08)

## 2016-08-11 MED ORDER — SODIUM CHLORIDE 0.9 % IV BOLUS (SEPSIS)
3000.0000 mL | Freq: Once | INTRAVENOUS | Status: AC
  Administered 2016-08-11: 3000 mL via INTRAVENOUS

## 2016-08-11 MED ORDER — SODIUM CHLORIDE 0.9 % IV BOLUS (SEPSIS)
500.0000 mL | Freq: Once | INTRAVENOUS | Status: AC
  Administered 2016-08-11: 500 mL via INTRAVENOUS

## 2016-08-11 MED ORDER — POTASSIUM CHLORIDE CRYS ER 20 MEQ PO TBCR
40.0000 meq | EXTENDED_RELEASE_TABLET | Freq: Once | ORAL | Status: AC
Start: 2016-08-11 — End: 2016-08-11
  Administered 2016-08-11: 40 meq via ORAL
  Filled 2016-08-11: qty 2

## 2016-08-11 MED ORDER — ORPHENADRINE CITRATE ER 100 MG PO TB12: 100.0000 mg | ORAL_TABLET | Freq: Two times a day (BID) | ORAL | Status: DC | PRN

## 2016-08-11 MED ORDER — METHYLPREDNISOLONE SODIUM SUCC 125 MG IJ SOLR
125.0000 mg | Freq: Once | INTRAMUSCULAR | Status: AC
  Administered 2016-08-11: 125 mg via INTRAVENOUS
  Filled 2016-08-11: qty 2

## 2016-08-11 MED ORDER — ACETAMINOPHEN 325 MG PO TABS: 650.0000 mg | ORAL_TABLET | Freq: Four times a day (QID) | ORAL | Status: DC | PRN

## 2016-08-11 MED ORDER — IPRATROPIUM-ALBUTEROL 0.5-2.5 (3) MG/3ML IN SOLN
3.0000 mL | Freq: Four times a day (QID) | RESPIRATORY_TRACT | Status: DC
  Administered 2016-08-11 – 2016-08-13 (×7): 3 mL via RESPIRATORY_TRACT
  Filled 2016-08-11 (×7): qty 3

## 2016-08-11 MED ORDER — METHYLPREDNISOLONE SODIUM SUCC 40 MG IJ SOLR
40.0000 mg | Freq: Three times a day (TID) | INTRAMUSCULAR | Status: DC
  Administered 2016-08-12 – 2016-08-13 (×4): 40 mg via INTRAVENOUS
  Filled 2016-08-11 (×4): qty 1

## 2016-08-11 MED ORDER — INSULIN ASPART 100 UNIT/ML ~~LOC~~ SOLN: 0.0000 [IU] | Freq: Three times a day (TID) | SUBCUTANEOUS | Status: DC

## 2016-08-11 MED ORDER — SODIUM CHLORIDE 0.9% FLUSH
3.0000 mL | Freq: Two times a day (BID) | INTRAVENOUS | Status: DC
  Administered 2016-08-12 – 2016-08-20 (×8): 3 mL via INTRAVENOUS

## 2016-08-11 MED ORDER — SODIUM CHLORIDE 0.9 % IV SOLN: 250.0000 mL | INTRAVENOUS | Status: DC | PRN

## 2016-08-11 MED ORDER — INSULIN ASPART 100 UNIT/ML ~~LOC~~ SOLN
0.0000 [IU] | Freq: Every day | SUBCUTANEOUS | Status: DC
  Administered 2016-08-11: 4 [IU] via SUBCUTANEOUS

## 2016-08-11 MED ORDER — OSELTAMIVIR PHOSPHATE 75 MG PO CAPS: 75.0000 mg | ORAL_CAPSULE | Freq: Every day | ORAL | Status: DC

## 2016-08-11 MED ORDER — VANCOMYCIN HCL IN DEXTROSE 1-5 GM/200ML-% IV SOLN
1000.0000 mg | Freq: Once | INTRAVENOUS | Status: AC
  Administered 2016-08-11: 1000 mg via INTRAVENOUS
  Filled 2016-08-11: qty 200

## 2016-08-11 MED ORDER — ASPIRIN EC 81 MG PO TBEC
81.0000 mg | DELAYED_RELEASE_TABLET | Freq: Every day | ORAL | Status: DC
  Administered 2016-08-11 – 2016-08-19 (×8): 81 mg via ORAL
  Filled 2016-08-11 (×9): qty 1

## 2016-08-11 MED ORDER — IOPAMIDOL (ISOVUE-300) INJECTION 61%
INTRAVENOUS | Status: AC
  Administered 2016-08-11: 75 mL
  Filled 2016-08-11: qty 75

## 2016-08-11 MED ORDER — CLOPIDOGREL BISULFATE 75 MG PO TABS
75.0000 mg | ORAL_TABLET | Freq: Every day | ORAL | Status: DC
  Administered 2016-08-11 – 2016-08-19 (×8): 75 mg via ORAL
  Filled 2016-08-11 (×9): qty 1

## 2016-08-11 MED ORDER — PIPERACILLIN-TAZOBACTAM 3.375 G IVPB
3.3750 g | Freq: Once | INTRAVENOUS | Status: AC
  Administered 2016-08-11: 3.375 g via INTRAVENOUS
  Filled 2016-08-11: qty 50

## 2016-08-11 MED ORDER — HYDROCHLOROTHIAZIDE 25 MG PO TABS: 25.0000 mg | ORAL_TABLET | Freq: Every day | ORAL | Status: DC

## 2016-08-11 MED ORDER — TAMSULOSIN HCL 0.4 MG PO CAPS
0.4000 mg | ORAL_CAPSULE | Freq: Every day | ORAL | Status: DC
  Administered 2016-08-11 – 2016-08-19 (×7): 0.4 mg via ORAL
  Filled 2016-08-11 (×8): qty 1

## 2016-08-11 MED ORDER — ALBUTEROL (5 MG/ML) CONTINUOUS INHALATION SOLN
10.0000 mg/h | INHALATION_SOLUTION | RESPIRATORY_TRACT | Status: DC
Start: 2016-08-11 — End: 2016-08-11
  Administered 2016-08-11: 10 mg/h via RESPIRATORY_TRACT
  Filled 2016-08-11: qty 20

## 2016-08-11 MED ORDER — NAPROXEN SODIUM 275 MG PO TABS
440.0000 mg | ORAL_TABLET | Freq: Every day | ORAL | Status: DC
  Filled 2016-08-11: qty 2

## 2016-08-11 MED ORDER — RAMIPRIL 10 MG PO CAPS
10.0000 mg | ORAL_CAPSULE | Freq: Every day | ORAL | Status: DC
  Filled 2016-08-11: qty 1

## 2016-08-11 MED ORDER — ENOXAPARIN SODIUM 40 MG/0.4ML ~~LOC~~ SOLN
40.0000 mg | SUBCUTANEOUS | Status: DC
  Administered 2016-08-11: 40 mg via SUBCUTANEOUS
  Filled 2016-08-11: qty 0.4

## 2016-08-11 MED ORDER — SODIUM CHLORIDE 0.9 % IV SOLN
INTRAVENOUS | Status: DC
  Administered 2016-08-11 – 2016-08-12 (×3): via INTRAVENOUS

## 2016-08-11 MED ORDER — HEPARIN BOLUS VIA INFUSION
3000.0000 [IU] | Freq: Once | INTRAVENOUS | Status: AC
  Administered 2016-08-11: 3000 [IU] via INTRAVENOUS
  Filled 2016-08-11: qty 3000

## 2016-08-11 MED ORDER — SODIUM CHLORIDE 0.9% FLUSH
3.0000 mL | Freq: Two times a day (BID) | INTRAVENOUS | Status: DC
Start: 2016-08-11 — End: 2016-08-20
  Administered 2016-08-12 – 2016-08-19 (×11): 3 mL via INTRAVENOUS

## 2016-08-11 MED ORDER — DUTASTERIDE 0.5 MG PO CAPS
0.5000 mg | ORAL_CAPSULE | Freq: Every day | ORAL | Status: DC
  Administered 2016-08-11 – 2016-08-19 (×8): 0.5 mg via ORAL
  Filled 2016-08-11 (×9): qty 1

## 2016-08-11 MED ORDER — INSULIN ASPART 100 UNIT/ML ~~LOC~~ SOLN
0.0000 [IU] | Freq: Three times a day (TID) | SUBCUTANEOUS | Status: DC
  Administered 2016-08-12 (×2): 2 [IU] via SUBCUTANEOUS
  Administered 2016-08-13: 3 [IU] via SUBCUTANEOUS
  Administered 2016-08-13 (×2): 2 [IU] via SUBCUTANEOUS
  Administered 2016-08-14: 5 [IU] via SUBCUTANEOUS
  Administered 2016-08-14: 2 [IU] via SUBCUTANEOUS
  Administered 2016-08-14: 3 [IU] via SUBCUTANEOUS

## 2016-08-11 MED ORDER — SODIUM CHLORIDE 0.9% FLUSH: 3.0000 mL | INTRAVENOUS | Status: DC | PRN

## 2016-08-11 MED ORDER — ATORVASTATIN CALCIUM 40 MG PO TABS
40.0000 mg | ORAL_TABLET | Freq: Every day | ORAL | Status: DC
  Administered 2016-08-11 – 2016-08-19 (×8): 40 mg via ORAL
  Filled 2016-08-11 (×9): qty 1

## 2016-08-11 MED ORDER — ACETAMINOPHEN 650 MG RE SUPP: 650.0000 mg | Freq: Four times a day (QID) | RECTAL | Status: DC | PRN

## 2016-08-11 MED ORDER — NITROGLYCERIN 0.4 MG SL SUBL: 0.4000 mg | SUBLINGUAL_TABLET | SUBLINGUAL | Status: DC | PRN

## 2016-08-11 MED ORDER — IPRATROPIUM-ALBUTEROL 0.5-2.5 (3) MG/3ML IN SOLN: 3.0000 mL | RESPIRATORY_TRACT | Status: DC | PRN

## 2016-08-11 MED ORDER — ONDANSETRON HCL 4 MG PO TABS
4.0000 mg | ORAL_TABLET | Freq: Four times a day (QID) | ORAL | Status: DC | PRN
  Filled 2016-08-11: qty 1

## 2016-08-11 MED ORDER — ATENOLOL 25 MG PO TABS
25.0000 mg | ORAL_TABLET | Freq: Every day | ORAL | Status: DC
  Administered 2016-08-12 – 2016-08-19 (×7): 25 mg via ORAL
  Filled 2016-08-11 (×9): qty 1

## 2016-08-11 MED ORDER — HEPARIN (PORCINE) IN NACL 100-0.45 UNIT/ML-% IJ SOLN
900.0000 [IU]/h | INTRAMUSCULAR | Status: DC
  Administered 2016-08-11: 900 [IU]/h via INTRAVENOUS
  Filled 2016-08-11: qty 250

## 2016-08-11 MED ORDER — ONDANSETRON HCL 4 MG/2ML IJ SOLN: 4.0000 mg | Freq: Four times a day (QID) | INTRAMUSCULAR | Status: DC | PRN

## 2016-08-11 MED ORDER — OSELTAMIVIR PHOSPHATE 30 MG PO CAPS
30.0000 mg | ORAL_CAPSULE | Freq: Two times a day (BID) | ORAL | Status: AC
  Administered 2016-08-11 – 2016-08-15 (×10): 30 mg via ORAL
  Filled 2016-08-11 (×11): qty 1

## 2016-08-11 NOTE — Progress Notes (Signed)
CRITICAL VALUE ALERT  Critical value received: lactic acid 6.6, troponin 0.45  Date of notification:  08/11/16  Time of notification:  2110  Critical value read back: yes  Nurse who received alert:  Karenann CaiA Laneka Mcgrory, RN  MD notified (1st page):  Pola CornELink  Time of first page:  2115

## 2016-08-11 NOTE — ED Triage Notes (Signed)
Pt states that he has been coughing and having back pain/body aches.  Went to the doctor and was sent here because his 02 was 83%.  Continues to be hypoxic.

## 2016-08-11 NOTE — ED Provider Notes (Signed)
[facility]-EMERGENCY DEPT Provider Note   CSN: [id number] Arrival date & time: 08/11/16  1104     History   Chief Complaint Chief Complaint  Patient presents with  . Cough  . Generalized Body Aches    HPI Angel [name] is a 81 y.o. male.  HPI Patient presents with 2 days of shortness breath, wheezing and mild nonproductive cough. He's had increased generalized weakness and diffuse body aches. He complains mostly of thoracic back pain. States the pain is not worse with deep inspiration. Denies any new lower extremity swelling or pain. Denies chest pain, abdominal pain, nausea or vomiting. Patient does not wear oxygen at baseline. Past Medical History:  Diagnosis Date  . Benign prostatic hypertrophy   . Coronary artery disease    status post PTCA and stenting of the left anterior descending artery  . Diabetes mellitus   . Hyperlipidemia   . Hypertension   . Paralyzed hemidiaphragm    Left  . Pneumonia    50 years ago    Patient Active Problem List   Diagnosis Date Noted  . COPD exacerbation (HCC) 08/11/2016  . Diabetes mellitus 10/06/2011  . HTN (hypertension) 10/06/2011  . Hyperlipidemia 10/06/2011  . CAD (coronary artery disease) 04/07/2011    No past surgical history on file.     Home Medications    Prior to Admission medications   Medication Sig Start Date End Date Taking? Authorizing Provider  aspirin 81 MG tablet Take 81 mg by mouth at bedtime.    Yes Historical Provider, MD  atenolol (TENORMIN) 25 MG tablet Take 1 tablet (25 mg total) by mouth daily. Patient taking differently: Take 25 mg by mouth at bedtime.  04/07/11  Yes Vesta Mixer, MD  atorvastatin (LIPITOR) 40 MG tablet TAKE 1 TABLET BY MOUTH EVERY DAY Patient taking differently: TAKE 40mg  TABLET BY MOUTH at night 03/03/16  Yes Vesta Mixer, MD  clopidogrel (PLAVIX) 75 MG tablet Take 75 mg by mouth at bedtime. 07/28/16  Yes Historical Provider, MD  dutasteride (AVODART) 0.5 MG capsule Take 0.5 mg by  mouth at bedtime.    Yes Historical Provider, MD  glipiZIDE-metformin (METAGLIP) 5-500 MG per tablet Take 1-1.5 tablets by mouth daily. Take 1 tablet in the morning and 1 and a half at night 04/08/13  Yes Historical Provider, MD  hydrochlorothiazide (HYDRODIURIL) 25 MG tablet Take 25 mg by mouth at bedtime.    Yes Historical Provider, MD  naproxen sodium (ANAPROX) 220 MG tablet Take 440 mg by mouth at bedtime.   Yes Historical Provider, MD  nitroGLYCERIN (NITROSTAT) 0.4 MG SL tablet Place 1 tablet (0.4 mg total) under the tongue as needed. 07/09/12  Yes Vesta Mixer, MD  orphenadrine (NORFLEX) 100 MG tablet Take 100 mg by mouth 2 (two) times daily as needed for muscle spasms.  11/28/13  Yes Historical Provider, MD  ramipril (ALTACE) 10 MG capsule TAKE 1 CAPSULE BY MOUTH DAILY Patient taking differently: TAKE 10mg  CAPSULE BY MOUTH at night 06/29/13  Yes Vesta Mixer, MD  Tamsulosin HCl (FLOMAX) 0.4 MG CAPS Take 0.4 mg by mouth at bedtime.    Yes Historical Provider, MD    Family History Family History  Problem Relation Age of Onset  . Cancer Father   . Cancer Brother     Social History Social History  Substance Use Topics  . Smoking status: Never Smoker  . Smokeless tobacco: Not on file  . Alcohol use Yes     Allergies  Patient has no known allergies.   Review of Systems Review of Systems  Constitutional: Positive for activity change and chills. Negative for fever.  HENT: Negative for congestion, rhinorrhea and sore throat.   Eyes: Negative for visual disturbance.  Respiratory: Positive for cough and shortness of breath. Negative for chest tightness.   Cardiovascular: Negative for chest pain, palpitations and leg swelling.  Gastrointestinal: Negative for abdominal pain, constipation, diarrhea, nausea and vomiting.  Musculoskeletal: Positive for back pain and myalgias. Negative for neck pain and neck stiffness.  Neurological: Negative for dizziness, weakness,  light-headedness, numbness and headaches.  All other systems reviewed and are negative.    Physical Exam Updated Vital Signs BP 151/65   Pulse 117   Temp 98.3 F (36.8 C) (Oral)   Resp 22   Ht 5\' 5"  (1.651 m)   Wt 161 lb (73 kg)   SpO2 99%   BMI 26.79 kg/m   Physical Exam  Constitutional: He is oriented to person, place, and time. He appears well-developed and well-nourished.  HENT:  Head: Normocephalic and atraumatic.  Mouth/Throat: Oropharynx is clear and moist.  Eyes: EOM are normal. Pupils are equal, round, and reactive to light.  Neck: Normal range of motion. Neck supple. No JVD present.  Cardiovascular: Normal rate and regular rhythm.   Murmur heard. Pulmonary/Chest:  Increased work of breathing. Decreased air movement throughout. End expiratory wheezing.  Abdominal: Soft. Bowel sounds are normal. There is no tenderness. There is no rebound and no guarding.  Musculoskeletal: Normal range of motion. He exhibits no edema or tenderness.  No lower extremity swelling or asymmetry. No midline thoracic or lumbar tenderness. No CVA tenderness.  Neurological: He is alert and oriented to person, place, and time.  Moving all extremities without deficit. Sensation intact.  Skin: Skin is warm and dry. Capillary refill takes less than 2 seconds. No rash noted. No erythema.  Psychiatric: He has a normal mood and affect. His behavior is normal.  Nursing note and vitals reviewed.    ED Treatments / Results  Labs (all labs ordered are listed, but only abnormal results are displayed) Labs Reviewed  COMPREHENSIVE METABOLIC PANEL - Abnormal; Notable for the following:       Result Value   Chloride 96 (*)    CO2 33 (*)    Glucose, Bld 203 (*)    ALT 16 (*)    All other components within normal limits  CBC WITH DIFFERENTIAL/PLATELET - Abnormal; Notable for the following:    WBC 12.6 (*)    Neutro Abs 10.6 (*)    All other components within normal limits  URINALYSIS, ROUTINE W  REFLEX MICROSCOPIC - Abnormal; Notable for the following:    APPearance HAZY (*)    Glucose, UA 50 (*)    Hgb urine dipstick MODERATE (*)    Ketones, ur 20 (*)    Protein, ur 30 (*)    All other components within normal limits  BRAIN NATRIURETIC PEPTIDE - Abnormal; Notable for the following:    B Natriuretic Peptide 174.6 (*)    All other components within normal limits  I-STAT CG4 LACTIC ACID, ED - Abnormal; Notable for the following:    Lactic Acid, Venous 2.52 (*)    All other components within normal limits  I-STAT CG4 LACTIC ACID, ED - Abnormal; Notable for the following:    Lactic Acid, Venous 9.02 (*)    All other components within normal limits  INFLUENZA PANEL BY PCR (TYPE A & B)  Rosezena Sensor, ED  I-STAT CG4 LACTIC ACID, ED    EKG  EKG Interpretation  Date/Time:  Monday August 11 2016 11:32:38 EST Ventricular Rate:  94 PR Interval:    QRS Duration: 108 QT Interval:  355 QTC Calculation: 444 R Axis:   -8 Text Interpretation:  Abnormal R-wave progression, early transition Baseline wander in lead(s) V2 Confirmed by Ranae Palms  MD, Rmani Kellogg ([id number]) on 08/11/2016 11:36:33 AM       Radiology Ct Chest W Contrast  Result Date: 08/11/2016 CLINICAL DATA:  Marked asymmetric elevation left hemidiaphragm. EXAM: CT CHEST WITH CONTRAST TECHNIQUE: Multidetector CT imaging of the chest was performed during intravenous contrast administration. CONTRAST:  1 ISOVUE-300 IOPAMIDOL (ISOVUE-300) INJECTION 61% COMPARISON:  Chest x-ray 08/11/2016. FINDINGS: Cardiovascular: The heart size is normal. No pericardial effusion. Coronary artery calcification is noted. Atherosclerotic calcification is noted in the wall of the thoracic aorta. Mediastinum/Nodes: Scattered small mediastinal lymph nodes noted without lymphadenopathy. There is no axillary lymphadenopathy. The esophagus has normal imaging features. There is no hilar lymphadenopathy. Lungs/Pleura: Fine detail obscured by breathing motion  during image acquisition. Centrilobular and paraseptal emphysema noted bilaterally. Abdominal fat and anatomy from the left upper quadrant projects up into the lower left hemithorax. No overlying diaphragmatic tissue can be identified in this may represent a large hernia. Although images are unavailable, the report for a CT scan 2003 documented "Markedly elevated left hemidiaphragm" suggesting this is a chronic process. Anterior left lung collapse/ scarring is evident. No suspicious pulmonary nodule or mass. No pulmonary edema or pleural effusion. Upper Abdomen: Visualized portions the upper abdomen are unremarkable. Musculoskeletal: Bone windows reveal no worrisome lytic or sclerotic osseous lesions. IMPRESSION: 1. Marked volume loss in the left hemithorax way elevation of left upper quadrant abdominal contents in the lower left chest. No left hemi diaphragmatic tissue can be seen overlying the abdominal contents in this may represent a large diaphragmatic hernia or marked attenuation and elevation of the left hemidiaphragm. Similar appearance was described on the CT scan from 14 years ago suggesting chronicity. 2. Coronary artery atherosclerosis. 3.  Emphysema. ([id number]) Electronically Signed   By: Kennith Center M.D.   On: 08/11/2016 14:24   Dg Chest Port 1 View  Result Date: 08/11/2016 CLINICAL DATA:  Pt states that he has been coughing and having back pain/body aches for unknown length of time Went to the doctor today and was sent here because his 02 was 83%. Continues to be hypoxic. Hx CAD, DM, htn, pna EXAM: PORTABLE CHEST 1 VIEW COMPARISON:  05/18/2006 FINDINGS: Cardiac silhouette is partly obscured by the elevated left hemidiaphragm, as well as being deviated to the right. Cardiac silhouette appears mildly enlarged, but stable. No mediastinal or hilar masses. There is crowding of the bronchovascular structures of the left lung base above the elevated hemidiaphragm. Lungs are otherwise clear with no  evidence of pneumonia. No pleural effusion or pneumothorax. Skeletal structures are intact. IMPRESSION: 1. No acute cardiopulmonary disease. 2. Significant elevation of left hemidiaphragm and mild cardiomegaly, stable from the prior study. Electronically Signed   By: Amie Portland M.D.   On: 08/11/2016 12:28    Procedures Procedures (including critical care time)  Medications Ordered in ED Medications  albuterol (PROVENTIL,VENTOLIN) solution continuous neb (10 mg/hr Nebulization New Bag/Given 08/11/16 1202)  vancomycin (VANCOCIN) IVPB 1000 mg/200 mL premix (1,000 mg Intravenous New Bag/Given 08/11/16 1534)  oseltamivir (TAMIFLU) capsule 30 mg (not administered)  methylPREDNISolone sodium succinate (SOLU-MEDROL) 125 mg/2 mL injection 125 mg (125 mg Intravenous  Given 08/11/16 1227)  iopamidol (ISOVUE-300) 61 % injection (75 mLs  Contrast Given 08/11/16 1359)  piperacillin-tazobactam (ZOSYN) IVPB 3.375 g (0 g Intravenous Stopped 08/11/16 1534)  sodium chloride 0.9 % bolus 500 mL (500 mLs Intravenous New Bag/Given 08/11/16 1536)     Initial Impression / Assessment and Plan / ED Course  I have reviewed the triage vital signs and the nursing notes.  Pertinent labs & imaging results that were available during my care of the patient were reviewed by me and considered in my medical decision making (see chart for details).    Patient respiratory status improved after continuous neb. Noted to be tremulous and tachycardic after initial med. Imaging acutely more short of breath when lying flat for CT scan. Likely due to diaphragmatic hernia. Placed on BiPAP with improvement of rest with status. Patient's initial lactic acid was 2.5. Repeat was 9. Question whether this is due to lab error. We will give broad-spectrum antibiotics and repeat lactic acid. Discussed with hospitalist and we'll see patient in the emergency department and admit to step down bed.   Final Clinical Impressions(s) / ED Diagnoses    Final diagnoses:  COPD exacerbation (HCC)  Diaphragm paralysis    New Prescriptions New Prescriptions   No medications on file     Loren Racer, MD 08/14/16 0005

## 2016-08-11 NOTE — Progress Notes (Signed)
Pt became  Increasingly SOB with O2 sats in low 80's, RR mid-high 30's. RT placed on BiPAP. Pt anxiety decreased and work of breathing decreased, O2 sat now 99.   Will continue to monitor.

## 2016-08-11 NOTE — ED Notes (Addendum)
Spoke with Noreene LarssonJill ( Phlebotomy) in the main lab . Noreene LarssonJill will be coming to draw I-stat on the pt. Nurse aware.

## 2016-08-11 NOTE — Progress Notes (Signed)
RT [facility] with [facility] on 4 lpm [place]- PT states he is breathing much better than at arrival. BiPAP is at bedside and PT encouraged to notify staff he   feels SOB- RN at bedside.

## 2016-08-11 NOTE — H&P (Addendum)
History and Physical    Angel [name]:[id number] DOB: March 07, 1936 DOA: 08/11/2016  PCP: Jackie Plum, MD   Patient coming from: Primary care office  Chief Complaint: Dyspnea  HPI: Angel [name] is a 81 y.o. male with medical history significant for left diaphragmatic hernia who presents to the hospital with chief complaint of worsening dyspnea. For last 3 days patient has been developing worsening dyspnea to the point where he is symptomatic with minimal efforts. His dyspnea is moderate to severe, associated with wheezing and dry cough, no improving factors, worse with exertion.   Denies any tobacco abuse, patient uses albuterol inhaler as needed at home, for many years. At his baseline he uses rescue inhaler about 5 times per night during 1 month period.  At his usual state of health, the patient is physically active.   ED Course: Patient was found in respiratory distress, placed on bipap, refer for further admission.  Review of Systems: 1. General. No fevers no chills, no weight gain or weight loss 2. ENT no runny nose or sore throat 3. Pulmonary positive shortness of breath as mentioned in history present illness 4. Cardiovascular. No angina no claudication, no PND orthopnea 5. Gastrointestinal no nausea vomiting or diarrhea 6. Skeletal no joint pain 7. Urology no dysuria or increased urinary frequency 8. Neurology no seizures or paresthesias 9. Hematology no easy bruisability or frequent infections 10. Dermatology no rashes   Past Medical History:  Diagnosis Date  . Benign prostatic hypertrophy   . Coronary artery disease    status post PTCA and stenting of the left anterior descending artery  . Diabetes mellitus   . Hyperlipidemia   . Hypertension   . Paralyzed hemidiaphragm    Left  . Pneumonia    50 years ago    No past surgical history on file.   reports that he has never smoked. He does not have any smokeless tobacco history on file. He reports that he  drinks alcohol. His drug history is not on file.  No Known Allergies  Family History  Problem Relation Age of Onset  . Cancer Father   . Cancer Brother    Unacceptable: Noncontributory, unremarkable, or negative. Acceptable: Family history reviewed and not pertinent (If you reviewed it)  Prior to Admission medications   Medication Sig Start Date End Date Taking? Authorizing Provider  aspirin 81 MG tablet Take 81 mg by mouth at bedtime.    Yes Historical Provider, MD  atenolol (TENORMIN) 25 MG tablet Take 1 tablet (25 mg total) by mouth daily. Patient taking differently: Take 25 mg by mouth at bedtime.  04/07/11  Yes Vesta Mixer, MD  atorvastatin (LIPITOR) 40 MG tablet TAKE 1 TABLET BY MOUTH EVERY DAY Patient taking differently: TAKE 40mg  TABLET BY MOUTH at night 03/03/16  Yes Vesta Mixer, MD  clopidogrel (PLAVIX) 75 MG tablet Take 75 mg by mouth at bedtime. 07/28/16  Yes Historical Provider, MD  dutasteride (AVODART) 0.5 MG capsule Take 0.5 mg by mouth at bedtime.    Yes Historical Provider, MD  glipiZIDE-metformin (METAGLIP) 5-500 MG per tablet Take 1-1.5 tablets by mouth daily. Take 1 tablet in the morning and 1 and a half at night 04/08/13  Yes Historical Provider, MD  hydrochlorothiazide (HYDRODIURIL) 25 MG tablet Take 25 mg by mouth at bedtime.    Yes Historical Provider, MD  naproxen sodium (ANAPROX) 220 MG tablet Take 440 mg by mouth at bedtime.   Yes Historical Provider, MD  nitroGLYCERIN (NITROSTAT) 0.4  MG SL tablet Place 1 tablet (0.4 mg total) under the tongue as needed. 07/09/12  Yes Vesta Mixer, MD  orphenadrine (NORFLEX) 100 MG tablet Take 100 mg by mouth 2 (two) times daily as needed for muscle spasms.  11/28/13  Yes Historical Provider, MD  ramipril (ALTACE) 10 MG capsule TAKE 1 CAPSULE BY MOUTH DAILY Patient taking differently: TAKE 10mg  CAPSULE BY MOUTH at night 06/29/13  Yes Vesta Mixer, MD  Tamsulosin HCl (FLOMAX) 0.4 MG CAPS Take 0.4 mg by mouth at bedtime.     Yes Historical Provider, MD    Physical Exam: Vitals:   08/11/16 1127 08/11/16 1129 08/11/16 1202 08/11/16 1505  BP: 151/65   151/65  Pulse: 92   117  Resp: 24   22  Temp: 98.3 F (36.8 C)     TempSrc: Oral     SpO2: (!) 83%  90% 99%  Weight:  73 kg (161 lb)    Height:  5\' 5"  (1.651 m)        Constitutional: deconditioned and dyspneic Vitals:   08/11/16 1127 08/11/16 1129 08/11/16 1202 08/11/16 1505  BP: 151/65   151/65  Pulse: 92   117  Resp: 24   22  Temp: 98.3 F (36.8 C)     TempSrc: Oral     SpO2: (!) 83%  90% 99%  Weight:  73 kg (161 lb)    Height:  5\' 5"  (1.651 m)     Eyes: PERRL, lids and conjunctivae mild pale,but no icterus. Head normocephalic, nose and eras with no deformities. Facemask BiPAP ENMT: Mucous membranes are  dry. Posterior pharynx clear of any exudate or lesions.Normal dentition.  Neck: normal, supple, no masses, no thyromegaly Respiratory: decreased breath sounds at bases on auscultation mostly on the left side, no wheezing. Positive scattered crackles and rhonchi. Mild accessory muscle use.  Cardiovascular: Regular rate and rhythm, no murmurs / rubs / gallops. No extremity edema. 2+ pedal pulses. No carotid bruits.  Abdomen: no tenderness, no masses palpated. No hepatosplenomegaly. Bowel sounds positive. No ascites Musculoskeletal: no clubbing / cyanosis. No joint deformity upper and lower extremities. Good ROM, no contractures. Normal muscle tone.  Skin: no rashes, lesions, ulcers. No induration Neurologic: CN 2-12 grossly intact. Sensation intact, DTR normal. Strength 5/5 in all 4.    Labs on Admission: I have personally reviewed following labs and imaging studies  CBC:  Recent Labs Lab 08/11/16 1147  WBC 12.6*  NEUTROABS 10.6*  HGB 13.8  HCT 41.9  MCV 92.1  PLT 201   Basic Metabolic Panel:  Recent Labs Lab 08/11/16 1147  NA 139  K 4.6  CL 96*  CO2 33*  GLUCOSE 203*  BUN 17  CREATININE 0.93  CALCIUM 9.8    GFR: Estimated Creatinine Clearance: 56.9 mL/min (by C-G formula based on SCr of 0.93 mg/dL). Liver Function Tests:  Recent Labs Lab 08/11/16 1147  AST 25  ALT 16*  ALKPHOS 96  BILITOT 1.2  PROT 7.5  ALBUMIN 4.7   No results for input(s): LIPASE, AMYLASE in the last 168 hours. No results for input(s): AMMONIA in the last 168 hours. Coagulation Profile: No results for input(s): INR, PROTIME in the last 168 hours. Cardiac Enzymes: No results for input(s): CKTOTAL, CKMB, CKMBINDEX, TROPONINI in the last 168 hours. BNP (last 3 results) No results for input(s): PROBNP in the last 8760 hours. HbA1C: No results for input(s): HGBA1C in the last 72 hours. CBG: No results for input(s): GLUCAP in the  last 168 hours. Lipid Profile: No results for input(s): CHOL, HDL, LDLCALC, TRIG, CHOLHDL, LDLDIRECT in the last 72 hours. Thyroid Function Tests: No results for input(s): TSH, T4TOTAL, FREET4, T3FREE, THYROIDAB in the last 72 hours. Anemia Panel: No results for input(s): VITAMINB12, FOLATE, FERRITIN, TIBC, IRON, RETICCTPCT in the last 72 hours. Urine analysis:    Component Value Date/Time   COLORURINE YELLOW 08/11/2016 1147   APPEARANCEUR HAZY (A) 08/11/2016 1147   LABSPEC 1.021 08/11/2016 1147   PHURINE 5.0 08/11/2016 1147   GLUCOSEU 50 (A) 08/11/2016 1147   HGBUR MODERATE (A) 08/11/2016 1147   BILIRUBINUR NEGATIVE 08/11/2016 1147   KETONESUR 20 (A) 08/11/2016 1147   PROTEINUR 30 (A) 08/11/2016 1147   NITRITE NEGATIVE 08/11/2016 1147   LEUKOCYTESUR NEGATIVE 08/11/2016 1147   Sepsis Labs: !!!!!!!!!!!!!!!!!!!!!!!!!!!!!!!!!!!!!!!!!!!! @LABRCNTIP (procalcitonin:4,lacticidven:4) )No results found for this or any previous visit (from the past 240 hour(s)).   Radiological Exams on Admission: Ct Chest W Contrast  Result Date: 08/11/2016 CLINICAL DATA:  Marked asymmetric elevation left hemidiaphragm. EXAM: CT CHEST WITH CONTRAST TECHNIQUE: Multidetector CT imaging of the chest was  performed during intravenous contrast administration. CONTRAST:  1 ISOVUE-300 IOPAMIDOL (ISOVUE-300) INJECTION 61% COMPARISON:  Chest x-ray 08/11/2016. FINDINGS: Cardiovascular: The heart size is normal. No pericardial effusion. Coronary artery calcification is noted. Atherosclerotic calcification is noted in the wall of the thoracic aorta. Mediastinum/Nodes: Scattered small mediastinal lymph nodes noted without lymphadenopathy. There is no axillary lymphadenopathy. The esophagus has normal imaging features. There is no hilar lymphadenopathy. Lungs/Pleura: Fine detail obscured by breathing motion during image acquisition. Centrilobular and paraseptal emphysema noted bilaterally. Abdominal fat and anatomy from the left upper quadrant projects up into the lower left hemithorax. No overlying diaphragmatic tissue can be identified in this may represent a large hernia. Although images are unavailable, the report for a CT scan 2003 documented "Markedly elevated left hemidiaphragm" suggesting this is a chronic process. Anterior left lung collapse/ scarring is evident. No suspicious pulmonary nodule or mass. No pulmonary edema or pleural effusion. Upper Abdomen: Visualized portions the upper abdomen are unremarkable. Musculoskeletal: Bone windows reveal no worrisome lytic or sclerotic osseous lesions. IMPRESSION: 1. Marked volume loss in the left hemithorax way elevation of left upper quadrant abdominal contents in the lower left chest. No left hemi diaphragmatic tissue can be seen overlying the abdominal contents in this may represent a large diaphragmatic hernia or marked attenuation and elevation of the left hemidiaphragm. Similar appearance was described on the CT scan from 14 years ago suggesting chronicity. 2. Coronary artery atherosclerosis. 3.  Emphysema. ([id number](ICD10-J43.9) Electronically Signed   By: Kennith CenterEric  Mansell M.D.   On: 08/11/2016 14:24   Dg Chest Port 1 View  Result Date: 08/11/2016 CLINICAL DATA:  Pt states  that he has been coughing and having back pain/body aches for unknown length of time Went to the doctor today and was sent here because his 02 was 83%. Continues to be hypoxic. Hx CAD, DM, htn, pna EXAM: PORTABLE CHEST 1 VIEW COMPARISON:  05/18/2006 FINDINGS: Cardiac silhouette is partly obscured by the elevated left hemidiaphragm, as well as being deviated to the right. Cardiac silhouette appears mildly enlarged, but stable. No mediastinal or hilar masses. There is crowding of the bronchovascular structures of the left lung base above the elevated hemidiaphragm. Lungs are otherwise clear with no evidence of pneumonia. No pleural effusion or pneumothorax. Skeletal structures are intact. IMPRESSION: 1. No acute cardiopulmonary disease. 2. Significant elevation of left hemidiaphragm and mild cardiomegaly, stable from the prior study.  Electronically Signed   By: Amie Portland M.D.   On: 08/11/2016 12:28    EKG: Independently reviewed.  Sinus rhythm, repeat EKG showed sinus tachycardia with ST depressions in the inferior lateral leads consistent with repolarization changes.  Assessment/Plan Active Problems:   COPD exacerbation (HCC)   This is a 81 year old male who is physically active, presents with shortness of breath for last 3 days, worsening to the point where he is having significant symptoms with minimal efforts. Denies any tobacco exposure. He uses frequently rescue inhaler/albuterol, at his baseline. On initial physical examination his blood pressure 151/65, heart rate 102, respiratory rate of 24, oxygen saturation 83% on room air, temperature 98.3. His conjunctiva is mildly pale, his lungs had decreased breath sounds bilaterally, scattered rales and rhonchi, no lower extremity edema. Sodium 139, potassium 4.6, chloride 96, bicarbonate 33, glucose 203, BUN 17, creatinine 0.93, AST 25, ALT 16, BNP 174, lactic acid 2.5, - 9.0, white count 12.6, Hemoglobin 13.8, hematocrit 41.9, platelets 201. Urinalysis  negative for infection. CT scan personally review, lung windows with no significant infiltrates, noted large diaphragmatic hernia on the left, with shifting of the mediastinum to the right. Chest x-ray personally review showing significant hernia on the left hemidiaphragm.   Working diagnosis: Suspected asthma exacerbation complicated by hypoxic respiratory failure.  1. Acute hypoxic respiratory failure due to asthma exacerbation. Will admit patient to the stepdown unit and will continue noninvasive mechanical ventilation with BiPAP, will start patient with systemic steroids Solu-Medrol 40 mg IV every 8 hours, aggressive bronchodilator therapy with DuoNeb's. Will follow-up on flu PCR.   2. Hypertension. Continue atenolol, ramipril and hydrochlorothiazide for blood pressure control, continue Plavix and aspirin. Patient clinically euvolemic  3. Type 2 diabetes mellitus. Will hold on glipizide, will place patient on insulin sliding scale for glucose coverage and monitoring.  4. BPH. We'll continue Flomax and Avodart. No signs of urinary retention.  5. Dyslipidemia. Continue atorvastatin.  6. Hyperlactatemia. Noted serum bicarbonate to be 33, unlikely acidosis, it is possible that lactic elevation is due to B type physiologic setting, will follow-up lactic acid. Patient received broad-spectrum antibiotics in the emergency department. There is no radiologic report of incarceration of diaphragmatic hernia.    DVT prophylaxis: enoxapaparin Code Status: FUll  Family Communication: no family at the bedside  Disposition Plan: Home  Consults called: None Admission status: Inpatient  Mauricio Annett Gula MD [facility] Pager [phone]  If 7PM-7AM, please contact night-coverage [email or web address] Password [facility]  08/11/2016, 4:17 PM

## 2016-08-11 NOTE — Progress Notes (Signed)
CRITICAL VALUE ALERT  Critical value received:  Lactic acid 3.2  Date of notification:  08/11/16  Time of notification:  2330  Critical value read back: yes  Nurse who received alert:  Karenann CaiA Ronalda Walpole, RN  MD notified (1st page):  ELink  Time of first page:  2330

## 2016-08-11 NOTE — ED Notes (Signed)
Waiting for Respiratory to transport patient

## 2016-08-11 NOTE — ED Notes (Signed)
Urinal at bedside.  

## 2016-08-11 NOTE — Progress Notes (Signed)
Upon arrival to the unit, pt appeared stable.  ED RN stated that the lab was to come and draw another Lactic Acid due to the possibility of the previous results being questionable.  Lab was notified of orders.  Pt VS were stable upon being connected to the ICU/SD monitor: BP: 106/41, HR 100, O2 95%.   At 1900 a critical Lactic Acid value was reported to this RN: 8.6.  At the same time the BP on the monitor read to be 87/28 (47).  A manual BP was taken: 90/46.  ELINK notified.  1915 BP: 92/38 (57).  1L bolus of NS was administered.  [facility] responded and was made aware of the pt's labs and BPs.  Orders to give 2 additional L boluses of NS, to place a second IV site, and to draw an ABG, were immediately given and executed.  Further orders placed in EPIC.  Pt appears to be comfortable, although work of breathing, and respiratory rate are slightly elevated.  Respiratory therapist at bedside. Receiving RN at bedside with this RN.  She was made aware of the pt's status, and the plan of care/orders for the pt.

## 2016-08-11 NOTE — Progress Notes (Signed)
   reviewe  1. Doing ok on bipap  2.AKI - bettter with3 l fluids but noted he is on htctz and ace inhibitor and vanc  2. Lactate -improving but still high  4. Trop mildly up - ekg nil acute  Plan  - continue bipap  - 500cc more fluid bolus and then infuse at 75cc/h - track lacate - start iv heparin gtt - track trop = check echo   .Dr. Kalman ShanMurali Buffey Zabinski, M.D., [facility] HospitalF.C.C.P Pulmonary and Critical Care Medicine Staff Physician [facility] [place] [facility] Pager: [phone], If no answer or between  15:00h - 7:00h: call [phone]  08/11/2016 10:07 PM

## 2016-08-11 NOTE — Progress Notes (Signed)
ANTICOAGULATION CONSULT NOTE - Initial Consult  Pharmacy Consult for heparin Indication: chest pain/ACS  No Known Allergies  Patient Measurements: Height: 5\' 5"  (165.1 cm) Weight: 160 lb 4.4 oz (72.7 kg) IBW/kg (Calculated) : 61.5 Heparin Dosing Weight: 73kg  Vital Signs: Temp: 98.2 F (36.8 C) (01/22 1915) Temp Source: Oral (01/22 1915) BP: 94/46 (01/22 2145) Pulse Rate: 91 (01/22 2145)  Labs:  Recent Labs  08/11/16 1147 08/11/16 1811 08/11/16 2000  HGB 13.8 13.1  --   HCT 41.9 40.7  --   PLT 201 199  --   APTT  --   --  24  CREATININE 0.93 1.28* 1.25*  TROPONINI  --   --  0.45*    Estimated Creatinine Clearance: 42.4 mL/min (by C-G formula based on SCr of 1.25 mg/dL (H)).   Medical History: Past Medical History:  Diagnosis Date  . Benign prostatic hypertrophy   . Coronary artery disease    status post PTCA and stenting of the left anterior descending artery  . Diabetes mellitus   . Hyperlipidemia   . Hypertension   . Paralyzed hemidiaphragm    Left  . Pneumonia    50 years ago    Assessment: [age] YOM presented 1/22 with dyspnea. Troponin elevated and PCCM has ordered heparin per pharmacy.  Patient on plavix and ASA prior to admission for h/o CAD and sent to [facility].  Also already on beta-blocker and statin therapy.   CBC: Hgb and pltc WNL Renal: SCr WNL Baseline aPTT - 24sec  Goal of Therapy:  Heparin level 0.3-0.7 units/ml Monitor platelets by anticoagulation protocol: Yes   Plan:   Heparin 3000 unit bolus then 900 units/hr  Check 8h heparin level   Daily Heparin level and CBC  Monitor for bleeding   Juliette Alcideustin Zeigler, PharmD, BCPS.   Pager: [phone] 08/11/2016 10:18 PM

## 2016-08-11 NOTE — Progress Notes (Signed)
RN calling elink  S: worried about rising lactic acidosis  O HR 98, pulse ox 92%, RR 21, SBP 106 with MAP 64 RN exam   - abd soft  - mild tachypneic but not paradoxical  - on [place] o2 +  - LABS  - bic up 33  - lactate rising  - wbc high     PULMONARY No results for input(s): PHART, PCO2ART, PO2ART, HCO3, TCO2, O2SAT in the last 168 hours.  Invalid input(s): PCO2, PO2  CBC  Recent Labs Lab 08/11/16 1147 08/11/16 1811  HGB 13.8 13.1  HCT 41.9 40.7  WBC 12.6* 14.4*  PLT 201 199    COAGULATION No results for input(s): INR in the last 168 hours.  CARDIAC  No results for input(s): TROPONINI in the last 168 hours. No results for input(s): PROBNP in the last 168 hours.   CHEMISTRY  Recent Labs Lab 08/11/16 1147 08/11/16 1811  NA 139  --   K 4.6  --   CL 96*  --   CO2 33*  --   GLUCOSE 203*  --   BUN 17  --   CREATININE 0.93 1.28*  CALCIUM 9.8  --    Estimated Creatinine Clearance: 41.4 mL/min (by C-G formula based on SCr of 1.28 mg/dL (H)).   LIVER  Recent Labs Lab 08/11/16 1147  AST 25  ALT 16*  ALKPHOS 96  BILITOT 1.2  PROT 7.5  ALBUMIN 4.7     INFECTIOUS  Recent Labs Lab 08/11/16 1439 08/11/16 1737 08/11/16 1811  LATICACIDVEN 9.02* 9.42* 8.6*     ENDOCRINE CBG (last 3)  No results for input(s): GLUCAP in the last 72 hours.       IMAGING x48h  - image(s) personally visualized  -   highlighted in bold Ct Chest W Contrast  Result Date: 08/11/2016 CLINICAL DATA:  Marked asymmetric elevation left hemidiaphragm. EXAM: CT CHEST WITH CONTRAST TECHNIQUE: Multidetector CT imaging of the chest was performed during intravenous contrast administration. CONTRAST:  1 ISOVUE-300 IOPAMIDOL (ISOVUE-300) INJECTION 61% COMPARISON:  Chest x-ray 08/11/2016. FINDINGS: Cardiovascular: The heart size is normal. No pericardial effusion. Coronary artery calcification is noted. Atherosclerotic calcification is noted in the wall of the thoracic aorta.  Mediastinum/Nodes: Scattered small mediastinal lymph nodes noted without lymphadenopathy. There is no axillary lymphadenopathy. The esophagus has normal imaging features. There is no hilar lymphadenopathy. Lungs/Pleura: Fine detail obscured by breathing motion during image acquisition. Centrilobular and paraseptal emphysema noted bilaterally. Abdominal fat and anatomy from the left upper quadrant projects up into the lower left hemithorax. No overlying diaphragmatic tissue can be identified in this may represent a large hernia. Although images are unavailable, the report for a CT scan 2003 documented "Markedly elevated left hemidiaphragm" suggesting this is a chronic process. Anterior left lung collapse/ scarring is evident. No suspicious pulmonary nodule or mass. No pulmonary edema or pleural effusion. Upper Abdomen: Visualized portions the upper abdomen are unremarkable. Musculoskeletal: Bone windows reveal no worrisome lytic or sclerotic osseous lesions. IMPRESSION: 1. Marked volume loss in the left hemithorax way elevation of left upper quadrant abdominal contents in the lower left chest. No left hemi diaphragmatic tissue can be seen overlying the abdominal contents in this may represent a large diaphragmatic hernia or marked attenuation and elevation of the left hemidiaphragm. Similar appearance was described on the CT scan from 14 years ago suggesting chronicity. 2. Coronary artery atherosclerosis. 3.  Emphysema. ([id number](ICD10-J43.9) Electronically Signed   By: Kennith CenterEric  Mansell M.D.   On: 08/11/2016  14:24   Dg Chest Port 1 View  Result Date: 08/11/2016 CLINICAL DATA:  Pt states that he has been coughing and having back pain/body aches for unknown length of time Went to the doctor today and was sent here because his 02 was 83%. Continues to be hypoxic. Hx CAD, DM, htn, pna EXAM: PORTABLE CHEST 1 VIEW COMPARISON:  05/18/2006 FINDINGS: Cardiac silhouette is partly obscured by the elevated left hemidiaphragm, as well as  being deviated to the right. Cardiac silhouette appears mildly enlarged, but stable. No mediastinal or hilar masses. There is crowding of the bronchovascular structures of the left lung base above the elevated hemidiaphragm. Lungs are otherwise clear with no evidence of pneumonia. No pleural effusion or pneumothorax. Skeletal structures are intact. IMPRESSION: 1. No acute cardiopulmonary disease. 2. Significant elevation of left hemidiaphragm and mild cardiomegaly, stable from the prior study. Electronically Signed   By: Amie Portland M.D.   On: 08/11/2016 12:28    A) -  Flu A - Mild to moderate acute hypoxemic resp failure - AKI - rising lactic acidosis  P - check abg  - 3L fluid bolus  - recheck bmet 10pm    Dr. Kalman Shan, M.D., [facility].C.P Pulmonary and Critical Care Medicine Staff Physician [facility] [place] [facility] Pager: [phone], If no answer or between  15:00h - 7:00h: call [phone]  08/11/2016 7:56 PM

## 2016-08-12 ENCOUNTER — Inpatient Hospital Stay (HOSPITAL_COMMUNITY): Payer: Commercial Managed Care - HMO

## 2016-08-12 DIAGNOSIS — R9431 Abnormal electrocardiogram [ECG] [EKG]: Secondary | ICD-10-CM

## 2016-08-12 DIAGNOSIS — J111 Influenza due to unidentified influenza virus with other respiratory manifestations: Secondary | ICD-10-CM

## 2016-08-12 DIAGNOSIS — J9601 Acute respiratory failure with hypoxia: Secondary | ICD-10-CM | POA: Diagnosis present

## 2016-08-12 DIAGNOSIS — Z955 Presence of coronary angioplasty implant and graft: Secondary | ICD-10-CM

## 2016-08-12 DIAGNOSIS — I248 Other forms of acute ischemic heart disease: Secondary | ICD-10-CM

## 2016-08-12 DIAGNOSIS — I119 Hypertensive heart disease without heart failure: Secondary | ICD-10-CM

## 2016-08-12 DIAGNOSIS — E7800 Pure hypercholesterolemia, unspecified: Secondary | ICD-10-CM

## 2016-08-12 HISTORY — DX: Influenza due to unidentified influenza virus with other respiratory manifestations: J11.1

## 2016-08-12 HISTORY — DX: Acute respiratory failure with hypoxia: J96.01

## 2016-08-12 LAB — COMPREHENSIVE METABOLIC PANEL
ALT: 14 U/L — ABNORMAL LOW (ref 17–63)
AST: 35 U/L (ref 15–41)
Albumin: 3.6 g/dL (ref 3.5–5.0)
Alkaline Phosphatase: 69 U/L (ref 38–126)
Anion gap: 7 (ref 5–15)
BUN: 17 mg/dL (ref 6–20)
CO2: 28 mmol/L (ref 22–32)
Calcium: 8.1 mg/dL — ABNORMAL LOW (ref 8.9–10.3)
Chloride: 103 mmol/L (ref 101–111)
Creatinine, Ser: 0.89 mg/dL (ref 0.61–1.24)
GFR calc Af Amer: >60 mL/min (ref >60)
GFR calc non Af Amer: >60 mL/min (ref >60)
Glucose, Bld: 196 mg/dL — ABNORMAL HIGH (ref 65–99)
Potassium: 4.2 mmol/L (ref 3.5–5.1)
Sodium: 138 mmol/L (ref 135–145)
Total Bilirubin: 0.7 mg/dL (ref 0.3–1.2)
Total Protein: 6 g/dL — ABNORMAL LOW (ref 6.5–8.1)

## 2016-08-12 LAB — PROCALCITONIN: Procalcitonin: 0.43 ng/mL

## 2016-08-12 LAB — ECHOCARDIOGRAM COMPLETE
Height: 65 in
Weight: 2582.03 oz

## 2016-08-12 LAB — STREP PNEUMONIAE URINARY ANTIGEN: Strep Pneumo Urinary Antigen: NEGATIVE

## 2016-08-12 LAB — CBC
HCT: 35 % — ABNORMAL LOW (ref 39.0–52.0)
Hemoglobin: 11.3 g/dL — ABNORMAL LOW (ref 13.0–17.0)
MCH: 29.8 pg (ref 26.0–34.0)
MCHC: 32.3 g/dL (ref 30.0–36.0)
MCV: 92.3 fL (ref 78.0–100.0)
Platelets: 176 10*3/uL (ref 150–400)
RBC: 3.79 MIL/uL — ABNORMAL LOW (ref 4.22–5.81)
RDW: 14.4 % (ref 11.5–15.5)
WBC: 16.3 10*3/uL — ABNORMAL HIGH (ref 4.0–10.5)

## 2016-08-12 LAB — LACTIC ACID, PLASMA
Lactic Acid, Venous: 3.2 mmol/L (ref 0.5–1.9)
Lactic Acid, Venous: 0.7 mmol/L (ref 0.5–1.9)

## 2016-08-12 LAB — GLUCOSE, CAPILLARY
Glucose-Capillary: 148 mg/dL — ABNORMAL HIGH (ref 65–99)
Glucose-Capillary: 193 mg/dL — ABNORMAL HIGH (ref 65–99)
Glucose-Capillary: 173 mg/dL — ABNORMAL HIGH (ref 65–99)
Glucose-Capillary: 153 mg/dL — ABNORMAL HIGH (ref 65–99)

## 2016-08-12 LAB — TROPONIN I
Troponin I: 1.56 ng/mL (ref <0.03)
Troponin I: 1.11 ng/mL (ref <0.03)

## 2016-08-12 LAB — PROTIME-INR
INR: 1.2
Prothrombin Time: 15.3 seconds — ABNORMAL HIGH (ref 11.4–15.2)

## 2016-08-12 LAB — PHOSPHORUS: Phosphorus: 3.5 mg/dL (ref 2.5–4.6)

## 2016-08-12 LAB — BRAIN NATRIURETIC PEPTIDE: B Natriuretic Peptide: 403.6 pg/mL — ABNORMAL HIGH (ref 0.0–100.0)

## 2016-08-12 LAB — MAGNESIUM: Magnesium: 3.2 mg/dL — ABNORMAL HIGH (ref 1.7–2.4)

## 2016-08-12 LAB — HEPARIN LEVEL (UNFRACTIONATED): Heparin Unfractionated: 0.74 IU/mL — ABNORMAL HIGH (ref 0.30–0.70)

## 2016-08-12 MED ORDER — DEXTROSE 5 % IV SOLN
6.0000 g | Freq: Once | INTRAVENOUS | Status: AC
  Administered 2016-08-12: 6 g via INTRAVENOUS
  Filled 2016-08-12: qty 2

## 2016-08-12 MED ORDER — TRAMADOL HCL 50 MG PO TABS
50.0000 mg | ORAL_TABLET | Freq: Four times a day (QID) | ORAL | Status: DC | PRN
  Administered 2016-08-12 – 2016-08-17 (×6): 50 mg via ORAL
  Filled 2016-08-12 (×6): qty 1

## 2016-08-12 MED ORDER — HEPARIN (PORCINE) IN NACL 100-0.45 UNIT/ML-% IJ SOLN
800.0000 [IU]/h | INTRAMUSCULAR | Status: DC
  Administered 2016-08-12: 800 [IU]/h via INTRAVENOUS
  Filled 2016-08-12: qty 250

## 2016-08-12 MED ORDER — ACETAMINOPHEN 325 MG PO TABS
650.0000 mg | ORAL_TABLET | Freq: Four times a day (QID) | ORAL | Status: DC | PRN
Start: 2016-08-12 — End: 2016-08-18

## 2016-08-12 NOTE — Progress Notes (Signed)
eLink Physician-Brief Progress Note Patient Name: Angel [name] DOB: Apr 23, 1936 MRN: [id number]   Date of Service  08/12/2016  HPI/Events of Note    eICU Interventions  hypomag/Hypokalemia -repleted      Intervention Category Intermediate Interventions: Electrolyte abnormality - evaluation and management  [name] V. 08/12/2016, 12:36 AM

## 2016-08-12 NOTE — Plan of Care (Signed)
RN paged elevated troponin with trend from .45 to 1.56. Pt has no active chest pain. Review of chart shows pt is on ASA 81mg , Plavix, and Heparin drip which was started by PCCM. Recheck EKG this am to assess for changes.  KJKG, NP [facility]

## 2016-08-12 NOTE — Progress Notes (Signed)
ANTICOAGULATION CONSULT NOTE  Pharmacy Consult for heparin Indication: chest pain/ACS  No Known Allergies  Patient Measurements: Height: 5\' 5"  (165.1 cm) Weight: 161 lb 6 oz (73.2 kg) IBW/kg (Calculated) : 61.5 Heparin Dosing Weight: 73kg  Vital Signs: Temp: 98.6 F (37 C) (01/23 0439) Temp Source: Oral (01/23 0439) BP: 103/44 (01/23 0600) Pulse Rate: 70 (01/23 0600)  Labs:  Recent Labs  08/11/16 1147 08/11/16 1811 08/11/16 2000 08/12/16 0339 08/12/16 0637  HGB 13.8 13.1  --   --  11.3*  HCT 41.9 40.7  --   --  35.0*  PLT 201 199  --   --  176  APTT  --   --  24  --   --   LABPROT  --   --   --  15.3*  --   INR  --   --   --  1.20  --   HEPARINUNFRC  --   --   --   --  0.74*  CREATININE 0.93 1.28* 1.25* 0.89  --   TROPONINI  --   --  0.45* 1.56*  --     Estimated Creatinine Clearance: 59.5 mL/min (by C-G formula based on SCr of 0.89 mg/dL).   Medical History: Past Medical History:  Diagnosis Date  . Benign prostatic hypertrophy   . Coronary artery disease    status post PTCA and stenting of the left anterior descending artery  . Diabetes mellitus   . Hyperlipidemia   . Hypertension   . Paralyzed hemidiaphragm    Left  . Pneumonia    50 years ago    Assessment: [age] YOM presented 1/22 with dyspnea. Troponin elevated and PCCM has ordered heparin per pharmacy.  Patient on plavix and ASA prior to admission for h/o CAD and sent to LAD.  Also already on beta-blocker and statin therapy.   Today, 08/12/2016:  Heparin level 0.74, supratherapeutic with infusion at 900 units/hr  CBC: Hgb 11.3, plts WNL  SCr WNL  tropinin trending upward  Goal of Therapy:  Heparin level 0.3-0.7 units/ml Monitor platelets by anticoagulation protocol: Yes   Plan:   Decrease heparin infusion to 800 units/hr  Check 8h heparin level   Daily Heparin level and CBC  Monitor for bleeding   Clance BollAmanda Elma Shands, PharmD, BCPS Pager: [phone] 08/12/2016 7:52 AM

## 2016-08-12 NOTE — Progress Notes (Addendum)
PROGRESS NOTE    Angel [name]  ZOX:[id number] DOB: 10-10-1935 DOA: 08/11/2016 PCP: Jackie Plum, MD     Brief Narrative:  Angel [name] is a 81 y.o. male with medical history significant for left diaphragmatic hernia who presents to the hospital with chief complaint of worsening dyspnea. For last 3 days patient has been developing worsening dyspnea to the point where he is symptomatic with minimal efforts. His dyspnea is moderate to severe, associated with wheezing and dry cough, no improving factors, worse with exertion. He was admitted to stepdown unit for acute hypoxemic respiratory failure, COPD exacerbation, influenza requiring BiPAP use.   Assessment & Plan:   Principal Problem:   Acute respiratory failure with hypoxia (HCC) Active Problems:   CAD (coronary artery disease)   Diabetes mellitus (HCC)   HTN (hypertension)   Hyperlipidemia   COPD exacerbation (HCC)   Influenza  Acute hypoxemic respiratory failure secondary to COPD exacerbation as well as influenza -Currently on BiPAP, continues to wean as able -Continue steroids, nebs  -Continue Tamiflu  Severe sepsis secondary to influenza -Lactic acidosis resolved -Received vanco/zosyn in ED  -Continue tamiflu  -IVF   Elevated troponin, due to demand ischemia  -No complaints of chest pain, troponin 1.56 --> 1.1 , EKG without ST-T changes -Heparin gtt started overnight - stop  -Echo pending  -Cardiology consulted   Essential hypertension -Continue home medications atenolol -BP stable currently   CAD -Continue aspirin, Plavix, lipitor   Type 2 diabetes mellitus -Hold glipizide -Novolog with meals and Insulin sliding scale  Hypomagnesemia -Replaced at time of admission    DVT prophylaxis: heparin gtt  Code Status: Full Family Communication: no family at bedside Disposition Plan: pending further improvement and stabilization    Consultants:   Cardiology   Procedures:   None  Antimicrobials:    Anti-infectives    Start     Dose/Rate Route Frequency Ordered Stop   08/11/16 1515  oseltamivir (TAMIFLU) capsule 30 mg     30 mg Oral 2 times daily 08/11/16 1500 08/16/16 0959   08/11/16 1500  piperacillin-tazobactam (ZOSYN) IVPB 3.375 g     3.375 g 12.5 mL/hr over 240 Minutes Intravenous  Once 08/11/16 1456 08/11/16 1534   08/11/16 1500  vancomycin (VANCOCIN) IVPB 1000 mg/200 mL premix     1,000 mg 200 mL/hr over 60 Minutes Intravenous  Once 08/11/16 1456 08/11/16 1734   08/11/16 1500  oseltamivir (TAMIFLU) capsule 75 mg  Status:  Discontinued     75 mg Oral Daily 08/11/16 1456 08/11/16 1500          Subjective: Patient reports that he was short of breath prior to admission. Currently comfortable on BiPAP. He states that his shortness of breath has completely resolved. He is wondering when he can get off BiPAP. He denies any chest pain or nausea or vomiting.  Objective: Vitals:   08/12/16 1000 08/12/16 1030 08/12/16 1137 08/12/16 1200  BP: (!) 116/39 (!) 111/39 (!) 120/53 (!) 117/52  Pulse: 77 77 83 73  Resp: 19 18 (!) 23 (!) 29  Temp:      TempSrc:      SpO2: 97% 96% 99% 100%  Weight:      Height:        Intake/Output Summary (Last 24 hours) at 08/12/16 1240 Last data filed at 08/12/16 1000  Gross per 24 hour  Intake             5336 ml  Output  675 ml  Net             4661 ml   Filed Weights   08/11/16 1129 08/11/16 2000 08/12/16 0439  Weight: 73 kg (161 lb) 72.7 kg (160 lb 4.4 oz) 73.2 kg (161 lb 6 oz)    Examination:  General exam: Appears calm and comfortable  Respiratory system: +rhonchi, +Bipap. Cardiovascular system: S1 & S2 heard, RRR. No JVD, murmurs, rubs, gallops or clicks. No pedal edema. Gastrointestinal system: Abdomen is nondistended, soft and nontender. No organomegaly or masses felt. Normal bowel sounds heard. Central nervous system: Alert and oriented. No focal neurological deficits. Extremities: Symmetric 5 x 5 power. Skin: No  rashes, lesions or ulcers Psychiatry: Judgement and insight appear normal. Mood & affect appropriate.   Data Reviewed: I have personally reviewed following labs and imaging studies  CBC:  Recent Labs Lab 08/11/16 1147 08/11/16 1811 08/12/16 0637  WBC 12.6* 14.4* 16.3*  NEUTROABS 10.6*  --   --   HGB 13.8 13.1 11.3*  HCT 41.9 40.7 35.0*  MCV 92.1 92.9 92.3  PLT 201 199 176   Basic Metabolic Panel:  Recent Labs Lab 08/11/16 1147 08/11/16 1811 08/11/16 2000 08/11/16 2019 08/12/16 0339  NA 139  --  138  --  138  K 4.6  --  3.2*  --  4.2  CL 96*  --  100*  --  103  CO2 33*  --  25  --  28  GLUCOSE 203*  --  348*  --  196*  BUN 17  --  19  --  17  CREATININE 0.93 1.28* 1.25*  --  0.89  CALCIUM 9.8  --  8.1*  --  8.1*  MG  --   --   --  1.1* 3.2*  PHOS  --   --   --  2.8 3.5   GFR: Estimated Creatinine Clearance: 59.5 mL/min (by C-G formula based on SCr of 0.89 mg/dL). Liver Function Tests:  Recent Labs Lab 08/11/16 1147 08/12/16 0339  AST 25 35  ALT 16* 14*  ALKPHOS 96 69  BILITOT 1.2 0.7  PROT 7.5 6.0*  ALBUMIN 4.7 3.6   No results for input(s): LIPASE, AMYLASE in the last 168 hours. No results for input(s): AMMONIA in the last 168 hours. Coagulation Profile:  Recent Labs Lab 08/12/16 0339  INR 1.20   Cardiac Enzymes:  Recent Labs Lab 08/11/16 2000 08/12/16 0339  TROPONINI 0.45* 1.56*   BNP (last 3 results) No results for input(s): PROBNP in the last 8760 hours. HbA1C: No results for input(s): HGBA1C in the last 72 hours. CBG:  Recent Labs Lab 08/11/16 2129 08/12/16 0728  GLUCAP 313* 148*   Lipid Profile: No results for input(s): CHOL, HDL, LDLCALC, TRIG, CHOLHDL, LDLDIRECT in the last 72 hours. Thyroid Function Tests: No results for input(s): TSH, T4TOTAL, FREET4, T3FREE, THYROIDAB in the last 72 hours. Anemia Panel: No results for input(s): VITAMINB12, FOLATE, FERRITIN, TIBC, IRON, RETICCTPCT in the last 72 hours. Sepsis  Labs:  Recent Labs Lab 08/11/16 1811 08/11/16 2000 08/11/16 2249 08/12/16 0339 08/12/16 1121  PROCALCITON  --  0.30  --  0.43  --   LATICACIDVEN 8.6* 6.6* 3.2*  --  0.7    Recent Results (from the past 240 hour(s))  MRSA PCR Screening     Status: None   Collection Time: 08/11/16  6:07 PM  Result Value Ref Range Status   MRSA by PCR NEGATIVE NEGATIVE Final  Comment:        The GeneXpert MRSA Assay (FDA approved for NASAL specimens only), is one component of a comprehensive MRSA colonization surveillance program. It is not intended to diagnose MRSA infection nor to guide or monitor treatment for MRSA infections.        Radiology Studies: Ct Chest W Contrast  Result Date: 08/11/2016 CLINICAL DATA:  Marked asymmetric elevation left hemidiaphragm. EXAM: CT CHEST WITH CONTRAST TECHNIQUE: Multidetector CT imaging of the chest was performed during intravenous contrast administration. CONTRAST:  1 ISOVUE-300 IOPAMIDOL (ISOVUE-300) INJECTION 61% COMPARISON:  Chest x-ray 08/11/2016. FINDINGS: Cardiovascular: The heart size is normal. No pericardial effusion. Coronary artery calcification is noted. Atherosclerotic calcification is noted in the wall of the thoracic aorta. Mediastinum/Nodes: Scattered small mediastinal lymph nodes noted without lymphadenopathy. There is no axillary lymphadenopathy. The esophagus has normal imaging features. There is no hilar lymphadenopathy. Lungs/Pleura: Fine detail obscured by breathing motion during image acquisition. Centrilobular and paraseptal emphysema noted bilaterally. Abdominal fat and anatomy from the left upper quadrant projects up into the lower left hemithorax. No overlying diaphragmatic tissue can be identified in this may represent a large hernia. Although images are unavailable, the report for a CT scan 2003 documented "Markedly elevated left hemidiaphragm" suggesting this is a chronic process. Anterior left lung collapse/ scarring is  evident. No suspicious pulmonary nodule or mass. No pulmonary edema or pleural effusion. Upper Abdomen: Visualized portions the upper abdomen are unremarkable. Musculoskeletal: Bone windows reveal no worrisome lytic or sclerotic osseous lesions. IMPRESSION: 1. Marked volume loss in the left hemithorax way elevation of left upper quadrant abdominal contents in the lower left chest. No left hemi diaphragmatic tissue can be seen overlying the abdominal contents in this may represent a large diaphragmatic hernia or marked attenuation and elevation of the left hemidiaphragm. Similar appearance was described on the CT scan from 14 years ago suggesting chronicity. 2. Coronary artery atherosclerosis. 3.  Emphysema. ([id number](ICD10-J43.9) Electronically Signed   By: Kennith CenterEric  Mansell M.D.   On: 08/11/2016 14:24   Dg Chest Port 1 View  Result Date: 08/12/2016 CLINICAL DATA:  Respiratory failure EXAM: PORTABLE CHEST 1 VIEW COMPARISON:  08/11/2016 FINDINGS: Stable significant elevation of the left hemidiaphragm. Cardiomegaly, stable no definite acute airspace opacity or effusion. No acute bony abnormality. IMPRESSION: Stable elevation of the left hemidiaphragm and cardiomegaly. No definite acute process. Electronically Signed   By: Charlett NoseKevin  Dover M.D.   On: 08/12/2016 11:55   Dg Chest Port 1 View  Result Date: 08/11/2016 CLINICAL DATA:  Pt states that he has been coughing and having back pain/body aches for unknown length of time Went to the doctor today and was sent here because his 02 was 83%. Continues to be hypoxic. Hx CAD, DM, htn, pna EXAM: PORTABLE CHEST 1 VIEW COMPARISON:  05/18/2006 FINDINGS: Cardiac silhouette is partly obscured by the elevated left hemidiaphragm, as well as being deviated to the right. Cardiac silhouette appears mildly enlarged, but stable. No mediastinal or hilar masses. There is crowding of the bronchovascular structures of the left lung base above the elevated hemidiaphragm. Lungs are otherwise clear  with no evidence of pneumonia. No pleural effusion or pneumothorax. Skeletal structures are intact. IMPRESSION: 1. No acute cardiopulmonary disease. 2. Significant elevation of left hemidiaphragm and mild cardiomegaly, stable from the prior study. Electronically Signed   By: Amie Portlandavid  Ormond M.D.   On: 08/11/2016 12:28      Scheduled Meds: . aspirin EC  81 mg Oral QHS  . atenolol  25 mg Oral QHS  . atorvastatin  40 mg Oral QHS  . clopidogrel  75 mg Oral QHS  . dutasteride  0.5 mg Oral QHS  . insulin aspart  0-5 Units Subcutaneous QHS  . insulin aspart  0-9 Units Subcutaneous TID WC  . ipratropium-albuterol  3 mL Nebulization Q6H  . methylPREDNISolone (SOLU-MEDROL) injection  40 mg Intravenous Q8H  . oseltamivir  30 mg Oral BID  . sodium chloride flush  3 mL Intravenous Q12H  . sodium chloride flush  3 mL Intravenous Q12H  . tamsulosin  0.4 mg Oral QHS   Continuous Infusions: . sodium chloride 75 mL/hr at 08/12/16 1000  . heparin 800 Units/hr (08/12/16 1000)     LOS: 1 day    Time spent: 40 minutes   Noralee Stain, DO [facility] [email or web address] Password [facility] 08/12/2016, 12:40 PM

## 2016-08-12 NOTE — Progress Notes (Signed)
  Echocardiogram 2D Echocardiogram has been performed.  Arvil ChacoFoster, Wael Maestas 08/12/2016, 3:38 PM

## 2016-08-12 NOTE — Consult Note (Signed)
Cardiology Consult    Patient ID: Angel [name] MRN: [id number], DOB/AGE: [birth year]   Admit date: 08/11/2016 Date of Consult: 08/12/2016  Primary Physician: Jackie Plum, MD Primary Cardiologist: Dr. Elease Hashimoto Requesting Provider: [facility]  Patient Profile    Angel [name] is a 81 yo male with a PMH significant for CAD s/p PTCA and stenting of the LAD in 2004, paralyzed left hemidiaphragm, DM, HTN, and HLD. He presented to ED on 08/11/16 with worsening dyspnea, wheezing, and cough. He was admitted for management of SOB and found to be influenza positive.   Cardiology was consulted for elevated troponins this admission.   Past Medical History   Past Medical History:  Diagnosis Date   Benign prostatic hypertrophy    Coronary artery disease    status post PTCA and stenting of the left anterior descending artery   Diabetes mellitus    Hyperlipidemia    Hypertension    Paralyzed hemidiaphragm    Left   Pneumonia    50 years ago    History reviewed. No pertinent surgical history.   Allergies  No Known Allergies  History of Present Illness    Pt is well-known to to this service and last saw Dr. Elease Hashimoto on 02/04/16 with no current chest pain or SOB and no changes in medications at that time. On interview, patient's daughter and wife are at bedside, patient with BiPAP.  He states he had worsening SOB before presenting to the ED. He denies chest pain, palpitations, dizziness, syncope, and swelling in his legs.  His only complaint today is shortness of breath.  Troponins collected this admission showed a slight elevation of 1.56 (0.45). Third troponin level is pending. EKG's showed ST depression in V1-V3, which has resolved on subsequent EKGs (see below).   Inpatient Medications     aspirin EC  81 mg Oral QHS   atenolol  25 mg Oral QHS   atorvastatin  40 mg Oral QHS   clopidogrel  75 mg Oral QHS   dutasteride  0.5 mg Oral QHS   insulin aspart  0-5 Units  Subcutaneous QHS   insulin aspart  0-9 Units Subcutaneous TID WC   ipratropium-albuterol  3 mL Nebulization Q6H   methylPREDNISolone (SOLU-MEDROL) injection  40 mg Intravenous Q8H   oseltamivir  30 mg Oral BID   sodium chloride flush  3 mL Intravenous Q12H   sodium chloride flush  3 mL Intravenous Q12H   tamsulosin  0.4 mg Oral QHS    Family History    Family History  Problem Relation Age of Onset   Cancer Father    Cancer Brother     Social History    Social History   Social History   Marital status: Married    Spouse name: N/A   Number of children: N/A   Years of education: N/A   Occupational History   Not on file.   Social History Main Topics   Smoking status: Never Smoker   Smokeless tobacco: Never Used   Alcohol use Yes   Drug use: Unknown   Sexual activity: No   Other Topics Concern   Not on file   Social History Narrative   No narrative on file     Review of Systems    General:  No chills, fever, night sweats or weight changes. Cardiovascular:  No chest pain, edema, orthopnea, palpitations Dermatological: No rash, lesions/masses Respiratory: positive for cough, wheezing Urologic: No hematuria, dysuria Abdominal:   No nausea, vomiting, diarrhea, bright  red blood per rectum, melena, or hematemesis Neurologic:  No visual changes All other systems reviewed and are otherwise negative except as noted above.  Physical Exam    Blood pressure (!) 111/39, pulse 77, temperature 97.3 F (36.3 C), temperature source Axillary, resp. rate 18, height 5\' 5"  (1.651 m), weight 161 lb 6 oz (73.2 kg), SpO2 96 %.  General: Awake, alert, communication difficult with BiPAP in place, answers questions appropriately Psych: Normal affect. Neuro: Alert and oriented X 3.  HEENT: Normal  Neck: Supple without bruits or JVD. Lungs:  Coarse breath sounds heard throughout, diminished in bases, occasional expiratory wheeze, . Heart: Regular rate and rhythm,  S1 and S2, holosystolic murmur grade 3/6 on left and right upper sternal boarders Abdomen: Soft, non-tender, non-distended, BS + x 4.  Extremities: No LE edema. DP/PT/Radials 2+ and equal bilaterally.  Labs    Troponin [facility] Care Test)  Recent Labs  08/11/16 1204  TROPIPOC 0.02    Recent Labs  08/11/16 2000 08/12/16 0339  TROPONINI 0.45* 1.56*   Lab Results  Component Value Date   WBC 16.3 (H) 08/12/2016   HGB 11.3 (L) 08/12/2016   HCT 35.0 (L) 08/12/2016   MCV 92.3 08/12/2016   PLT 176 08/12/2016    Recent Labs Lab 08/12/16 0339  NA 138  K 4.2  CL 103  CO2 28  BUN 17  CREATININE 0.89  CALCIUM 8.1*  PROT 6.0*  BILITOT 0.7  ALKPHOS 69  ALT 14*  AST 35  GLUCOSE 196*   Lab Results  Component Value Date   CHOL 133 02/04/2016   HDL 40 02/04/2016   LDLCALC 59 02/04/2016   TRIG 168 (H) 02/04/2016   No results found for: [facility]   Radiology Studies    Ct Chest W Contrast  Result Date: 08/11/2016 CLINICAL DATA:  Marked asymmetric elevation left hemidiaphragm. EXAM: CT CHEST WITH CONTRAST TECHNIQUE: Multidetector CT imaging of the chest was performed during intravenous contrast administration. CONTRAST:  1 ISOVUE-300 IOPAMIDOL (ISOVUE-300) INJECTION 61% COMPARISON:  Chest x-ray 08/11/2016. FINDINGS: Cardiovascular: The heart size is normal. No pericardial effusion. Coronary artery calcification is noted. Atherosclerotic calcification is noted in the wall of the thoracic aorta. Mediastinum/Nodes: Scattered small mediastinal lymph nodes noted without lymphadenopathy. There is no axillary lymphadenopathy. The esophagus has normal imaging features. There is no hilar lymphadenopathy. Lungs/Pleura: Fine detail obscured by breathing motion during image acquisition. Centrilobular and paraseptal emphysema noted bilaterally. Abdominal fat and anatomy from the left upper quadrant projects up into the lower left hemithorax. No overlying diaphragmatic tissue can be identified in  this may represent a large hernia. Although images are unavailable, the report for a CT scan 2003 documented "Markedly elevated left hemidiaphragm" suggesting this is a chronic process. Anterior left lung collapse/ scarring is evident. No suspicious pulmonary nodule or mass. No pulmonary edema or pleural effusion. Upper Abdomen: Visualized portions the upper abdomen are unremarkable. Musculoskeletal: Bone windows reveal no worrisome lytic or sclerotic osseous lesions. IMPRESSION: 1. Marked volume loss in the left hemithorax way elevation of left upper quadrant abdominal contents in the lower left chest. No left hemi diaphragmatic tissue can be seen overlying the abdominal contents in this may represent a large diaphragmatic hernia or marked attenuation and elevation of the left hemidiaphragm. Similar appearance was described on the CT scan from 14 years ago suggesting chronicity. 2. Coronary artery atherosclerosis. 3.  Emphysema. (ZOX09-U04.9) Electronically Signed   By: Kennith Center M.D.   On: 08/11/2016 14:24  Dg Chest Port 1 View  Result Date: 08/11/2016 CLINICAL DATA:  Pt states that he has been coughing and having back pain/body aches for unknown length of time Went to the doctor today and was sent here because his 02 was 83%. Continues to be hypoxic. Hx CAD, DM, htn, pna EXAM: PORTABLE CHEST 1 VIEW COMPARISON:  05/18/2006 FINDINGS: Cardiac silhouette is partly obscured by the elevated left hemidiaphragm, as well as being deviated to the right. Cardiac silhouette appears mildly enlarged, but stable. No mediastinal or hilar masses. There is crowding of the bronchovascular structures of the left lung base above the elevated hemidiaphragm. Lungs are otherwise clear with no evidence of pneumonia. No pleural effusion or pneumothorax. Skeletal structures are intact. IMPRESSION: 1. No acute cardiopulmonary disease. 2. Significant elevation of left hemidiaphragm and mild cardiomegaly, stable from the prior study.  Electronically Signed   By: Amie Portland M.D.   On: 08/11/2016 12:28    ECG & Cardiac Imaging   EKG: 08/11/16 1143: Sinus rhythm; Borderline left axis deviation  08/11/16 1433: Sinus tachycardia; ST depression V1-V3, suggest recording posterior leads  08/11/16 2158: NSR with PVCs, Increased R/S ratio in V1, consider early transition or posterior infarct  08/11/16 2200: NSR   08/12/16 0535: NSR   Echocardiogram 02/18/16: Study Conclusions  - Left ventricle: The cavity size was normal. Wall thickness was   normal. Systolic function was normal. The estimated ejection   fraction was in the range of 55% to 60%. Although no diagnostic   regional wall motion abnormality was identified, this possibility cannot be completely excluded on the basis of this study. Doppler   parameters are consistent with abnormal left ventricular   relaxation (grade 1 diastolic dysfunction). - Aortic valve: Noncoronary cusp mobility was severely restricted.   There was moderate stenosis. Valve area (VTI): 1.27 cm^2. Valve  area (Vmax): 1.31 cm^2. Valve area (Vmean): 1.15 cm^2. Planimetry valve area 1.17 cm sq. - Left atrium: The atrium was mildly dilated.  Impressions: - Aortic valve gradients are probably underestimated due to poor Doppler beam alignment. By 2D images, aortic stenosis is moderate.   Left heart cath 10/22/02: CONCLUSIONS:  1. Primarily single-vessel coronary artery disease involving the left anterior descending artery and diagonal systems.  2. Successful percutaneous transluminal coronary angioplasty and stenting of the proximal and mid left anterior descending artery.   Assessment & Plan    1. History of CAD s/p stenting of LAD in 2004 - Continue ASA, plavix, atenolol, statin - holding home HCTZ and ramipril for marginal pressures   2. Elevated troponin this admission - troponin 0.45 --> 1.56 - recommend third troponin value for trend - EKG with episode of ST depression in V1-V3, now  resolved - SL nitro PRN - primary team started heparin gtt per pharmacy - pt denies chest pain, palpitations, and edema now and at the time of admission - today's EKG with NSR, no signs of ischemia - lactic acid peaked at 9.42 this admission, today is 0.7 - will discuss further ischemic evaluation with MD - echo pending to assess LV function, wall motion, and As; if no significant abnormalities may consider OP evaluation with nuclear stress test   3. COPD exacerbation secondary to influenza - duonebs, prednisone, tamiflu - BiPAP and management per primary team   4. HTN - past 24hr BP 74-57 - 103/46 - continue to hold home HCTZ and ACEI - management per primary team   5. Moderate Aortic Stenosis - noted on echo on 02/04/16 -  echo pending - will follow as OP in clinic   6. DM - lisproSS, per primary team   7. BPH - continue avodart and flomax - management per primary team   Signed, Duane BostonAngela N Duke, PA-C 08/12/2016, 11:27 AM

## 2016-08-13 ENCOUNTER — Inpatient Hospital Stay (HOSPITAL_COMMUNITY): Payer: Commercial Managed Care - HMO

## 2016-08-13 DIAGNOSIS — E785 Hyperlipidemia, unspecified: Secondary | ICD-10-CM

## 2016-08-13 DIAGNOSIS — I10 Essential (primary) hypertension: Secondary | ICD-10-CM

## 2016-08-13 DIAGNOSIS — I2510 Atherosclerotic heart disease of native coronary artery without angina pectoris: Secondary | ICD-10-CM

## 2016-08-13 DIAGNOSIS — R748 Abnormal levels of other serum enzymes: Secondary | ICD-10-CM

## 2016-08-13 DIAGNOSIS — E872 Acidosis: Secondary | ICD-10-CM

## 2016-08-13 DIAGNOSIS — J9600 Acute respiratory failure, unspecified whether with hypoxia or hypercapnia: Secondary | ICD-10-CM

## 2016-08-13 DIAGNOSIS — R338 Other retention of urine: Secondary | ICD-10-CM

## 2016-08-13 LAB — BASIC METABOLIC PANEL
Anion gap: 5 (ref 5–15)
BUN: 20 mg/dL (ref 6–20)
CO2: 28 mmol/L (ref 22–32)
Calcium: 8.2 mg/dL — ABNORMAL LOW (ref 8.9–10.3)
Chloride: 105 mmol/L (ref 101–111)
Creatinine, Ser: 0.74 mg/dL (ref 0.61–1.24)
GFR calc Af Amer: >60 mL/min (ref >60)
GFR calc non Af Amer: >60 mL/min (ref >60)
Glucose, Bld: 171 mg/dL — ABNORMAL HIGH (ref 65–99)
Potassium: 4.9 mmol/L (ref 3.5–5.1)
Sodium: 138 mmol/L (ref 135–145)

## 2016-08-13 LAB — GLUCOSE, CAPILLARY
Glucose-Capillary: 154 mg/dL — ABNORMAL HIGH (ref 65–99)
Glucose-Capillary: 240 mg/dL — ABNORMAL HIGH (ref 65–99)
Glucose-Capillary: 171 mg/dL — ABNORMAL HIGH (ref 65–99)
Glucose-Capillary: 156 mg/dL — ABNORMAL HIGH (ref 65–99)

## 2016-08-13 LAB — PHOSPHORUS: Phosphorus: 3.3 mg/dL (ref 2.5–4.6)

## 2016-08-13 LAB — MAGNESIUM: Magnesium: 2.2 mg/dL (ref 1.7–2.4)

## 2016-08-13 MED ORDER — HYDRALAZINE HCL 20 MG/ML IJ SOLN: 5.0000 mg | Freq: Four times a day (QID) | INTRAMUSCULAR | Status: DC | PRN

## 2016-08-13 MED ORDER — IPRATROPIUM-ALBUTEROL 0.5-2.5 (3) MG/3ML IN SOLN
3.0000 mL | RESPIRATORY_TRACT | Status: DC
  Administered 2016-08-13 – 2016-08-16 (×14): 3 mL via RESPIRATORY_TRACT
  Filled 2016-08-13 (×15): qty 3

## 2016-08-13 MED ORDER — RAMIPRIL 10 MG PO CAPS
10.0000 mg | ORAL_CAPSULE | Freq: Every day | ORAL | Status: DC
  Administered 2016-08-13 – 2016-08-19 (×6): 10 mg via ORAL
  Filled 2016-08-13 (×7): qty 1

## 2016-08-13 MED ORDER — HYDROCHLOROTHIAZIDE 25 MG PO TABS
25.0000 mg | ORAL_TABLET | Freq: Every day | ORAL | Status: DC
  Administered 2016-08-13 – 2016-08-16 (×4): 25 mg via ORAL
  Filled 2016-08-13 (×5): qty 1

## 2016-08-13 MED ORDER — ENOXAPARIN SODIUM 40 MG/0.4ML ~~LOC~~ SOLN
40.0000 mg | SUBCUTANEOUS | Status: DC
Start: 2016-08-13 — End: 2016-08-18
  Administered 2016-08-13 – 2016-08-17 (×5): 40 mg via SUBCUTANEOUS
  Filled 2016-08-13 (×6): qty 0.4

## 2016-08-13 MED ORDER — METHYLPREDNISOLONE SODIUM SUCC 125 MG IJ SOLR
60.0000 mg | Freq: Three times a day (TID) | INTRAMUSCULAR | Status: DC
  Administered 2016-08-13 – 2016-08-15 (×6): 60 mg via INTRAVENOUS
  Filled 2016-08-13 (×6): qty 2

## 2016-08-13 MED ORDER — INSULIN GLARGINE 100 UNIT/ML ~~LOC~~ SOLN
5.0000 [IU] | Freq: Every day | SUBCUTANEOUS | Status: DC
  Administered 2016-08-13 – 2016-08-14 (×2): 5 [IU] via SUBCUTANEOUS
  Filled 2016-08-13 (×2): qty 0.05

## 2016-08-13 NOTE — Progress Notes (Signed)
Patient complaining of inability to urinate and pressure. Patient was bladder scanned and volume was shown to be >999 cc. MD made aware. Awaiting further orders. VSS, will continue to follow up.

## 2016-08-13 NOTE — Progress Notes (Signed)
Pt stable with VS noted on 6Lpm nasal cannula.  Not respiratory distress noted.  BiPAP not indicated at this time.  RT will continue to monitor as needed.

## 2016-08-13 NOTE — Progress Notes (Signed)
PROGRESS NOTE  Angel [name]  ZOX:[id number] DOB: Jun 12, 1936  DOA: 08/11/2016 PCP: Jackie Plum, MD   Brief Narrative:  81 year old male with PMH of CAD status post LAD stenting, DM 2, HLD, HTN, BPH, paralyzed left diaphragm,? Asthma, lifelong nonsmoker, presented to ED on 08/11/16 with 3 days history of progressively worsening dyspnea, dry cough and wheezing. He was admitted to stepdown unit for acute hypoxic respiratory failure secondary to influenza related respiratory illness and possible asthma exacerbation. Needed BiPAP. Slowly improving.   Assessment & Plan:   Principal Problem:   Respiratory failure (HCC) Active Problems:   CAD (coronary artery disease)   Diabetes mellitus (HCC)   HTN (hypertension)   Hyperlipidemia   COPD exacerbation (HCC)   Influenza   Hypertensive heart disease without heart failure   Demand ischemia (HCC)   S/P coronary artery stent placement   1. Influenza A with acute viral bronchitis &? Asthma exacerbation: No formal diagnosis of asthma or COPD. Influenza A PCR positive and B PCR negative. Complete 5 day course of Tamiflu. Supportive treatment. 2. Acute respiratory failure with hypoxia: Likely secondary to problem #1. Needed BiPAP since admission until night of 1/23. Transitioned to oxygen via nasal cannula 4 L/m on 1/24. Has clinical bronchospasm. Increase Duo nebs and IV Solu-Medrol. Monitor closely in stepdown unit. CCM alerted in case he worsens. BiPAP when necessary. Repeat chest x-ray 1/24. 3. Severe sepsis secondary to influenza: Sepsis physiology improved. Received a dose of vancomycin and Zosyn in the ED which were not continued due to lack of definitive source of bacterial infection. 4. Elevated troponin/history of CAD status post LAD stenting in 2004: Secondary to demand ischemia from acute illness. EKG without acute changes. Troponin has decreased. Briefly on IV heparin drip which was discontinued. No chest pain reported. Echo shows normal LV  function without wall motion abnormalities. Cardiology consultation and follow-up appreciated: Recommending outpatient stress test. Continue aspirin, Plavix, atenolol and statins. 5. Lactic acidosis: Secondary to acute illness. Resolved. 6. Essential hypertension: Mildly uncontrolled. Resume home ACEI and HCTZ. Continue atenolol. 7. DM 2: Mildly uncontrolled CBGs in the context of steroids. Continue NovoLog SSI. We'll add low-dose Lantus. Holding oral hypoglycemics. 8. BPH: Continue Avodart and Fosamax. 9. Hypomagnesemia: Replaced 10. Acute urinary retention: Noted on 1/24. Bladder scan shows >999 ML. In and out catheterization as needed. Continue BPH medications. Monitor. 11. Anemia: Follow CBCs.   DVT prophylaxis: Start Lovenox. Was on heparin drip until 1/3. Code Status: Full Family Communication: None at bedside Disposition Plan: Admitted to stepdown. Continue management in stepdown for additional 24 hours and then reassess. DC home when medically stable.   Consultants:   Cardiology  Procedures:   2-D echo 08/12/16: Study Conclusions  - Left ventricle: The cavity size was normal. Wall thickness was   increased in a pattern of mild LVH. Systolic function was normal.   The estimated ejection fraction was in the range of 55% to 60%.   Wall motion was normal; there were no regional wall motion   abnormalities. Features are consistent with a pseudonormal left   ventricular filling pattern, with concomitant abnormal relaxation   and increased filling pressure (grade 2 diastolic dysfunction). - Aortic valve: Valve mobility was restricted. There was mild   stenosis. - Mitral valve: Calcified annulus. - Left atrium: The atrium was moderately dilated. - Right ventricle: Systolic function was mildly reduced. - Right atrium: The atrium was mildly dilated. - Pulmonary arteries: Systolic pressure was moderately increased.  Impressions:  - Normal LV systolic  function; grade 2  diastolic dysfunction;   calcified aortic valve with mild AS; biatrial enlargement;   moderately elevated pulmonary pressure.  Antimicrobials:   Tamiflu 1/21 >  Zosyn and vancomycin 1 dose in ED    Subjective: Feels much better compared to admission. Dyspnea has significantly improved. Denies chest pain or cough. As per RN, able to come off of BiPAP last night.  Objective:  Vitals:   08/13/16 0801 08/13/16 1000 08/13/16 1200 08/13/16 1400  BP:  (!) 127/54 137/61 (!) 170/97  Pulse:  98 82 96  Resp:  (!) 22 (!) 25 (!) 24  Temp:      TempSrc:      SpO2: 98% 97% 99% 99%  Weight:      Height:        Intake/Output Summary (Last 24 hours) at 08/13/16 1517 Last data filed at 08/13/16 1000  Gross per 24 hour  Intake           1602.5 ml  Output              350 ml  Net           1252.5 ml   Filed Weights   08/11/16 2000 08/12/16 0439 08/13/16 0500  Weight: 72.7 kg (160 lb 4.4 oz) 73.2 kg (161 lb 6 oz) 76.3 kg (168 lb 3.4 oz)    Examination:  General exam: Pleasant elderly male lying propped up in bed with mild respiratory distress Respiratory system: Reduced breath sounds bilaterally with scattered few bilateral expiratory rhonchi but no crackles. Mild increased work of breathing.  Cardiovascular system: S1 & S2 heard, RRR. No JVD, murmurs, rubs, gallops or clicks. No pedal edema. Telemetry: Sinus rhythm with occasional PVCs and trigeminy night. Gastrointestinal system: Abdomen is nondistended, soft and nontender. No organomegaly or masses felt. Normal bowel sounds heard. Central nervous system: Alert and oriented. No focal neurological deficits. Extremities: Symmetric 5 x 5 power. Skin: No rashes, lesions or ulcers Psychiatry: Judgement and insight appear normal. Mood & affect appropriate.     Data Reviewed: I have personally reviewed following labs and imaging studies  CBC:  Recent Labs Lab 08/11/16 1147 08/11/16 1811 08/12/16 0637  WBC 12.6* 14.4* 16.3*    NEUTROABS 10.6*  --   --   HGB 13.8 13.1 11.3*  HCT 41.9 40.7 35.0*  MCV 92.1 92.9 92.3  PLT 201 199 176   Basic Metabolic Panel:  Recent Labs Lab 08/11/16 1147 08/11/16 1811 08/11/16 2000 08/11/16 2019 08/12/16 0339 08/13/16 0326  NA 139  --  138  --  138 138  K 4.6  --  3.2*  --  4.2 4.9  CL 96*  --  100*  --  103 105  CO2 33*  --  25  --  28 28  GLUCOSE 203*  --  348*  --  196* 171*  BUN 17  --  19  --  17 20  CREATININE 0.93 1.28* 1.25*  --  0.89 0.74  CALCIUM 9.8  --  8.1*  --  8.1* 8.2*  MG  --   --   --  1.1* 3.2* 2.2  PHOS  --   --   --  2.8 3.5 3.3   GFR: Estimated Creatinine Clearance: 72.5 mL/min (by C-G formula based on SCr of 0.74 mg/dL). Liver Function Tests:  Recent Labs Lab 08/11/16 1147 08/12/16 0339  AST 25 35  ALT 16* 14*  ALKPHOS 96 69  BILITOT 1.2 0.7  PROT  7.5 6.0*  ALBUMIN 4.7 3.6   No results for input(s): LIPASE, AMYLASE in the last 168 hours. No results for input(s): AMMONIA in the last 168 hours. Coagulation Profile:  Recent Labs Lab 08/12/16 0339  INR 1.20   Cardiac Enzymes:  Recent Labs Lab 08/11/16 2000 08/12/16 0339 08/12/16 1247  TROPONINI 0.45* 1.56* 1.11*   BNP (last 3 results) No results for input(s): PROBNP in the last 8760 hours. HbA1C: No results for input(s): HGBA1C in the last 72 hours. CBG:  Recent Labs Lab 08/12/16 1219 08/12/16 1528 08/12/16 2123 08/13/16 0713 08/13/16 1113  GLUCAP 193* 173* 153* 154* 240*   Lipid Profile: No results for input(s): CHOL, HDL, LDLCALC, TRIG, CHOLHDL, LDLDIRECT in the last 72 hours. Thyroid Function Tests: No results for input(s): TSH, T4TOTAL, FREET4, T3FREE, THYROIDAB in the last 72 hours. Anemia Panel: No results for input(s): VITAMINB12, FOLATE, FERRITIN, TIBC, IRON, RETICCTPCT in the last 72 hours.  Sepsis Labs:  Recent Labs Lab 08/11/16 1147  08/11/16 1811 08/11/16 2000 08/11/16 2249 08/12/16 0339 08/12/16 0637 08/12/16 1121  PROCALCITON  --    --   --  0.30  --  0.43  --   --   WBC 12.6*  --  14.4*  --   --   --  16.3*  --   LATICACIDVEN  --   < > 8.6* 6.6* 3.2*  --   --  0.7  < > = values in this interval not displayed.  Recent Results (from the past 240 hour(s))  MRSA PCR Screening     Status: None   Collection Time: 08/11/16  6:07 PM  Result Value Ref Range Status   MRSA by PCR NEGATIVE NEGATIVE Final    Comment:        The GeneXpert MRSA Assay (FDA approved for NASAL specimens only), is one component of a comprehensive MRSA colonization surveillance program. It is not intended to diagnose MRSA infection nor to guide or monitor treatment for MRSA infections.          Radiology Studies: Dg Chest Port 1 View  Result Date: 08/12/2016 CLINICAL DATA:  Respiratory failure EXAM: PORTABLE CHEST 1 VIEW COMPARISON:  08/11/2016 FINDINGS: Stable significant elevation of the left hemidiaphragm. Cardiomegaly, stable no definite acute airspace opacity or effusion. No acute bony abnormality. IMPRESSION: Stable elevation of the left hemidiaphragm and cardiomegaly. No definite acute process. Electronically Signed   By: Charlett NoseKevin  Dover M.D.   On: 08/12/2016 11:55        Scheduled Meds: . aspirin EC  81 mg Oral QHS  . atenolol  25 mg Oral QHS  . atorvastatin  40 mg Oral QHS  . clopidogrel  75 mg Oral QHS  . dutasteride  0.5 mg Oral QHS  . insulin aspart  0-5 Units Subcutaneous QHS  . insulin aspart  0-9 Units Subcutaneous TID WC  . ipratropium-albuterol  3 mL Nebulization Q4H  . methylPREDNISolone (SOLU-MEDROL) injection  60 mg Intravenous Q8H  . oseltamivir  30 mg Oral BID  . sodium chloride flush  3 mL Intravenous Q12H  . sodium chloride flush  3 mL Intravenous Q12H  . tamsulosin  0.4 mg Oral QHS   Continuous Infusions:   LOS: 2 days        [facility] Digestive HealthNGALGI,Mahima Hottle, MD [facility] Pager [phone]  If 7PM-7AM, please contact night-coverage [email or web address] Password [email or web address] 08/13/2016, 3:17 PM

## 2016-08-13 NOTE — Progress Notes (Signed)
Progress Note  Patient Name: Angel [name] Date of Encounter: 08/13/2016  Primary Cardiologist: Dr. Elease Hashimoto  Subjective   Pt states he feels fine today and continues to deny chest pain and palpitations. He is without BiPAP today, resting in bed.  Inpatient Medications    Scheduled Meds:  aspirin EC  81 mg Oral QHS   atenolol  25 mg Oral QHS   atorvastatin  40 mg Oral QHS   clopidogrel  75 mg Oral QHS   dutasteride  0.5 mg Oral QHS   insulin aspart  0-5 Units Subcutaneous QHS   insulin aspart  0-9 Units Subcutaneous TID WC   ipratropium-albuterol  3 mL Nebulization Q6H   methylPREDNISolone (SOLU-MEDROL) injection  40 mg Intravenous Q8H   oseltamivir  30 mg Oral BID   sodium chloride flush  3 mL Intravenous Q12H   sodium chloride flush  3 mL Intravenous Q12H   tamsulosin  0.4 mg Oral QHS   Continuous Infusions:  sodium chloride 75 mL/hr at 08/13/16 0200   PRN Meds: sodium chloride, acetaminophen, ipratropium-albuterol, nitroGLYCERIN, ondansetron **OR** ondansetron (ZOFRAN) IV, orphenadrine, sodium chloride flush, traMADol   Vital Signs    Vitals:   08/13/16 0600 08/13/16 0747 08/13/16 0800 08/13/16 0801  BP: 127/68 127/68    Pulse: 66 71    Resp: 16 17    Temp:   97.1 F (36.2 C)   TempSrc:   Axillary   SpO2: 100% 94%  98%  Weight:      Height:        Intake/Output Summary (Last 24 hours) at 08/13/16 0901 Last data filed at 08/13/16 0800  Gross per 24 hour  Intake             1853 ml  Output              595 ml  Net             1258 ml   Filed Weights   08/11/16 2000 08/12/16 0439 08/13/16 0500  Weight: 160 lb 4.4 oz (72.7 kg) 161 lb 6 oz (73.2 kg) 168 lb 3.4 oz (76.3 kg)    Telemetry    NSR in 90s today - Personally Reviewed  ECG    08/11/16 1143: Sinus rhythm; Borderline left axis deviation  08/11/16 1433: Sinus tachycardia; ST depression V1-V3, suggest recording posterior leads  08/11/16 2158: NSR with PVCs, Increased R/S ratio in  V1, consider early transition or posterior infarct  08/11/16 2200: NSR   08/12/16 0535: NSR  No new tracings today - Personally Reviewed  Physical Exam   General: Well developed, well nourished, male using accessory muscles to breathe, answers questions appropriately. Head: Normocephalic, atraumatic.  Neck: Supple without bruits Lungs:  Coarse sounds throughout, supplemental oxygen via [place] in place Heart: Irregular rhythm, regular rate, S1, S2, holosystolic murmur grade 3/6 on left and right upper sternal boarders; exam difficult 2/ coarse breath sounds. Abdomen: Soft, non-tender, non-distended with normoactive bowel sounds. No hepatomegaly. No rebound/guarding. No obvious abdominal masses. Extremities: Distal pedal pulses are 1+ bilaterally. Neuro: Alert and oriented X 3. Moves all extremities spontaneously. Psych: Normal affect.  Labs    Chemistry Recent Labs Lab 08/11/16 1147  08/11/16 2000 08/12/16 0339 08/13/16 0326  NA 139  --  138 138 138  K 4.6  --  3.2* 4.2 4.9  CL 96*  --  100* 103 105  CO2 33*  --  25 28 28   GLUCOSE 203*  --  348* 196* 171*  BUN 17  --  19 17 20   CREATININE 0.93  < > 1.25* 0.89 0.74  CALCIUM 9.8  --  8.1* 8.1* 8.2*  PROT 7.5  --   --  6.0*  --   ALBUMIN 4.7  --   --  3.6  --   AST 25  --   --  35  --   ALT 16*  --   --  14*  --   ALKPHOS 96  --   --  69  --   BILITOT 1.2  --   --  0.7  --   GFRNONAA >60  < > 53* >60 >60  GFRAA >60  < > >60 >60 >60  ANIONGAP 10  --  13 7 5   < > = values in this interval not displayed.   Hematology Recent Labs Lab 08/11/16 1147 08/11/16 1811 08/12/16 0637  WBC 12.6* 14.4* 16.3*  RBC 4.55 4.38 3.79*  HGB 13.8 13.1 11.3*  HCT 41.9 40.7 35.0*  MCV 92.1 92.9 92.3  MCH 30.3 29.9 29.8  MCHC 32.9 32.2 32.3  RDW 13.8 14.1 14.4  PLT 201 199 176    Cardiac Enzymes Recent Labs Lab 08/11/16 2000 08/12/16 0339 08/12/16 1247  TROPONINI 0.45* 1.56* 1.11*    Recent Labs Lab 08/11/16 1204  TROPIPOC  0.02     BNP Recent Labs Lab 08/11/16 1147 08/12/16 0637  BNP 174.6* 403.6*     DDimer No results for input(s): DDIMER in the last 168 hours.   Radiology    Ct Chest W Contrast  Result Date: 08/11/2016 CLINICAL DATA:  Marked asymmetric elevation left hemidiaphragm. EXAM: CT CHEST WITH CONTRAST TECHNIQUE: Multidetector CT imaging of the chest was performed during intravenous contrast administration. CONTRAST:  1 ISOVUE-300 IOPAMIDOL (ISOVUE-300) INJECTION 61% COMPARISON:  Chest x-ray 08/11/2016. FINDINGS: Cardiovascular: The heart size is normal. No pericardial effusion. Coronary artery calcification is noted. Atherosclerotic calcification is noted in the wall of the thoracic aorta. Mediastinum/Nodes: Scattered small mediastinal lymph nodes noted without lymphadenopathy. There is no axillary lymphadenopathy. The esophagus has normal imaging features. There is no hilar lymphadenopathy. Lungs/Pleura: Fine detail obscured by breathing motion during image acquisition. Centrilobular and paraseptal emphysema noted bilaterally. Abdominal fat and anatomy from the left upper quadrant projects up into the lower left hemithorax. No overlying diaphragmatic tissue can be identified in this may represent a large hernia. Although images are unavailable, the report for a CT scan 2003 documented "Markedly elevated left hemidiaphragm" suggesting this is a chronic process. Anterior left lung collapse/ scarring is evident. No suspicious pulmonary nodule or mass. No pulmonary edema or pleural effusion. Upper Abdomen: Visualized portions the upper abdomen are unremarkable. Musculoskeletal: Bone windows reveal no worrisome lytic or sclerotic osseous lesions. IMPRESSION: 1. Marked volume loss in the left hemithorax way elevation of left upper quadrant abdominal contents in the lower left chest. No left hemi diaphragmatic tissue can be seen overlying the abdominal contents in this may represent a large diaphragmatic hernia  or marked attenuation and elevation of the left hemidiaphragm. Similar appearance was described on the CT scan from 14 years ago suggesting chronicity. 2. Coronary artery atherosclerosis. 3.  Emphysema. ([id number]) Electronically Signed   By: Kennith Center M.D.   On: 08/11/2016 14:24   Dg Chest Port 1 View  Result Date: 08/12/2016 CLINICAL DATA:  Respiratory failure EXAM: PORTABLE CHEST 1 VIEW COMPARISON:  08/11/2016 FINDINGS: Stable significant elevation of the left hemidiaphragm. Cardiomegaly, stable no definite acute  airspace opacity or effusion. No acute bony abnormality. IMPRESSION: Stable elevation of the left hemidiaphragm and cardiomegaly. No definite acute process. Electronically Signed   By: Charlett Nose M.D.   On: 08/12/2016 11:55   Dg Chest Port 1 View  Result Date: 08/11/2016 CLINICAL DATA:  Pt states that he has been coughing and having back pain/body aches for unknown length of time Went to the doctor today and was sent here because his 02 was 83%. Continues to be hypoxic. Hx CAD, DM, htn, pna EXAM: PORTABLE CHEST 1 VIEW COMPARISON:  05/18/2006 FINDINGS: Cardiac silhouette is partly obscured by the elevated left hemidiaphragm, as well as being deviated to the right. Cardiac silhouette appears mildly enlarged, but stable. No mediastinal or hilar masses. There is crowding of the bronchovascular structures of the left lung base above the elevated hemidiaphragm. Lungs are otherwise clear with no evidence of pneumonia. No pleural effusion or pneumothorax. Skeletal structures are intact. IMPRESSION: 1. No acute cardiopulmonary disease. 2. Significant elevation of left hemidiaphragm and mild cardiomegaly, stable from the prior study. Electronically Signed   By: Amie Portland M.D.   On: 08/11/2016 12:28    Cardiac Studies   Echocardiogram 1/223/18  Study Conclusions - Left ventricle: The cavity size was normal. Wall thickness was increased in a pattern of mild LVH. Systolic function was  normal. The estimated ejection fraction was in the range of 55% to 60%.  Wall motion was normal; there were no regional wall motion abnormalities. Features are consistent with a pseudonormal left ventricular filling pattern, with concomitant abnormal relaxation and increased filling pressure (grade 2 diastolic dysfunction). - Aortic valve: Valve mobility was restricted. There was mild stenosis. - Mitral valve: Calcified annulus. - Left atrium: The atrium was moderately dilated. - Right ventricle: Systolic function was mildly reduced. - Right atrium: The atrium was mildly dilated. - Pulmonary arteries: Systolic pressure was moderately increased.  Impressions: - Normal LV systolic function; grade 2 diastolic dysfunction; calcified aortic valve with mild AS; biatrial enlargement; moderately elevated pulmonary pressure.   Echocardiogram 02/18/16: Study Conclusions  - Left ventricle: The cavity size was normal. Wall thickness was normal. Systolic function was normal. The estimated ejection fraction was in the range of 55% to 60%. Although no diagnostic regional wall motion abnormality was identified, this possibility cannot be completely excluded on the basis of this study. Doppler parameters are consistent with abnormal left ventricular relaxation (grade 1 diastolic dysfunction). - Aortic valve: Noncoronary cusp mobility was severely restricted. There was moderate stenosis. Valve area (VTI): 1.27 cm^2. Valve area (Vmax): 1.31 cm^2. Valve area (Vmean): 1.15 cm^2. Planimetry valve area 1.17 cm sq. - Left atrium: The atrium was mildly dilated.  Impressions: - Aortic valve gradients are probably underestimated due to poor Doppler beam alignment. By 2D images, aortic stenosis is moderate.   Left heart cath 10/22/02: CONCLUSIONS: 1. Primarily single-vessel coronary artery disease involving the left anterior descending artery and diagonal systems. 2. Successful percutaneous  transluminal coronary angioplasty and stenting of the proximal and mid left anterior descending artery.  Patient Profile     81 y.o. male with a PMH significant for CAD s/p PTCA and stenting of the LAD in 2004, paralyzed left hemidiaphragm, DM, HTN, and HLD. He presented to ED on 08/11/16 with worsening dyspnea, wheezing, and cough. He was admitted for management of SOB and found to be influenza positive.   Cardiology was consulted for elevated troponins this admission.   Assessment & Plan    1. History of CAD  s/p stenting of LAD in 2004 - Continue ASA, plavix, atenolol, statin - holding home HCTZ and ramipril for marginal pressures   2. Elevated troponin this admission - troponin trending down: 0.45 --> 1.56 --> 1.11 - EKG with episode of ST depression in V1-V3, now resolved; NSR - has not required SL nitro PRN - D/C'ed heparin gtt yesterday - pt continues to deny chest pain, palpitations, and edema  - lactic acid peaked at 9.42 this admission, normalized to  0.7 on 08/12/16 - echo with normal LV function, without new wall motion abnormalities, grade 2 diastolic dysfunction, and mild AS - given his lack of symptoms, downward trending troponin, and normal echo in the presence of infection, recommend outpatient evaluation of ischemic disease via stress test  - will discuss with MD   3. COPD exacerbation secondary to influenza - duonebs, prednisone, tamiflu - BiPAP and management per primary team   4. HTN - past 24hr BP 102/31 - 132/58 - consider adding back home ACEI or HCTZ as pressure allows - management per primary team   5. Moderate Aortic Stenosis - noted on echo on 02/04/16 - echo 08/12/16 with mild AS - will follow as OP in clinic   6. DM - lisproSS, per primary team   7. BPH - continue avodart and flomax - management per primary team   Signed, Duane BostonAngela N Duke , PA-C 9:01 AM 08/13/2016 Pager: [phone]

## 2016-08-13 NOTE — Progress Notes (Signed)
Pt. had a brief episode of irregular rhythm. Central tele questioned the rhythm as possible Atrial Flutter. Pt. asleep during the episode.Not in any distress. VS stable. BP 128/50 mmHG. 12-lead EKG was done. No significant finding in EKG. EKG report filed in chart. Mid-level notified.

## 2016-08-14 DIAGNOSIS — J209 Acute bronchitis, unspecified: Secondary | ICD-10-CM

## 2016-08-14 LAB — CBC
HCT: 39.3 % (ref 39.0–52.0)
Hemoglobin: 12.5 g/dL — ABNORMAL LOW (ref 13.0–17.0)
MCH: 30 pg (ref 26.0–34.0)
MCHC: 31.8 g/dL (ref 30.0–36.0)
MCV: 94.5 fL (ref 78.0–100.0)
Platelets: 216 10*3/uL (ref 150–400)
RBC: 4.16 MIL/uL — ABNORMAL LOW (ref 4.22–5.81)
RDW: 14.4 % (ref 11.5–15.5)
WBC: 18.5 10*3/uL — ABNORMAL HIGH (ref 4.0–10.5)

## 2016-08-14 LAB — GLUCOSE, CAPILLARY
Glucose-Capillary: 157 mg/dL — ABNORMAL HIGH (ref 65–99)
Glucose-Capillary: 238 mg/dL — ABNORMAL HIGH (ref 65–99)
Glucose-Capillary: 262 mg/dL — ABNORMAL HIGH (ref 65–99)
Glucose-Capillary: 221 mg/dL — ABNORMAL HIGH (ref 65–99)

## 2016-08-14 LAB — LEGIONELLA PNEUMOPHILA SEROGP 1 UR AG: L. pneumophila Serogp 1 Ur Ag: NEGATIVE

## 2016-08-14 MED ORDER — INSULIN ASPART 100 UNIT/ML ~~LOC~~ SOLN
0.0000 [IU] | Freq: Three times a day (TID) | SUBCUTANEOUS | Status: DC
Start: 2016-08-15 — End: 2016-08-20
  Administered 2016-08-15: 5 [IU] via SUBCUTANEOUS
  Administered 2016-08-15: 8 [IU] via SUBCUTANEOUS
  Administered 2016-08-15: 5 [IU] via SUBCUTANEOUS
  Administered 2016-08-16: 8 [IU] via SUBCUTANEOUS
  Administered 2016-08-16: 5 [IU] via SUBCUTANEOUS
  Administered 2016-08-16: 3 [IU] via SUBCUTANEOUS
  Administered 2016-08-17: 5 [IU] via SUBCUTANEOUS
  Administered 2016-08-17: 2 [IU] via SUBCUTANEOUS
  Administered 2016-08-18: 3 [IU] via SUBCUTANEOUS
  Administered 2016-08-18 – 2016-08-19 (×2): 2 [IU] via SUBCUTANEOUS
  Administered 2016-08-19 (×2): 3 [IU] via SUBCUTANEOUS
  Administered 2016-08-20 (×2): 2 [IU] via SUBCUTANEOUS

## 2016-08-14 MED ORDER — INSULIN ASPART 100 UNIT/ML ~~LOC~~ SOLN
0.0000 [IU] | Freq: Every day | SUBCUTANEOUS | Status: DC
  Administered 2016-08-14 – 2016-08-18 (×3): 2 [IU] via SUBCUTANEOUS

## 2016-08-14 MED ORDER — INSULIN GLARGINE 100 UNIT/ML ~~LOC~~ SOLN
10.0000 [IU] | Freq: Every day | SUBCUTANEOUS | Status: DC
  Administered 2016-08-15 – 2016-08-20 (×6): 10 [IU] via SUBCUTANEOUS
  Filled 2016-08-14 (×6): qty 0.1

## 2016-08-14 NOTE — Progress Notes (Signed)
PROGRESS NOTE  Angel [name]  WUJ:[id number] DOB: 08/04/1935  DOA: 08/11/2016 PCP: Jackie Plum, MD   Brief Narrative:  81 year old male with PMH of CAD status post LAD stenting, DM 2, HLD, HTN, BPH, paralyzed left diaphragm,? Asthma, lifelong nonsmoker, presented to ED on 08/11/16 with 3 days history of progressively worsening dyspnea, dry cough and wheezing. He was admitted to stepdown unit for acute hypoxic respiratory failure secondary to influenza related respiratory illness and possible asthma exacerbation. Needed BiPAP. Slowly improving.   Assessment & Plan:   Principal Problem:   Respiratory failure (HCC) Active Problems:   CAD (coronary artery disease)   Diabetes mellitus (HCC)   HTN (hypertension)   Hyperlipidemia   COPD exacerbation (HCC)   Influenza   Hypertensive heart disease without heart failure   Demand ischemia (HCC)   S/P coronary artery stent placement   1. Influenza A with acute viral bronchitis &? Asthma exacerbation: No formal diagnosis of asthma or COPD. Influenza A PCR positive and B PCR negative. Complete 5 day course of Tamiflu. Supportive treatment. Patient states that he was using when necessary albuterol inhaler at home, provided by his cardiologist, for "diaphragm weakness". 2. Acute respiratory failure with hypoxia: Likely secondary to problem #1. Has needed intermittent BiPAP since admission until 1/25 morning. Transitioned to nasal cannula oxygen and wean as tolerated for saturations >92%. Had clinical bronchospasm 1/24 and IV Solu-Medrol was increased to 60 mg every 8 hours. Continue bronchodilator nebulizations. Repeat chest x-ray 1/24 without acute findings. Slowly improving. If continues to require when necessary BiPAP for hypoxia or unable to wean off of oxygen, consider pulmonology consultation. 3. Severe sepsis secondary to influenza: Sepsis physiology improved. Received a dose of vancomycin and Zosyn in the ED which were not continued due to lack  of definitive source of bacterial infection. 4. Elevated troponin/history of CAD status post LAD stenting in 2004: Secondary to demand ischemia from acute illness. EKG without acute changes. Troponin has decreased. Briefly on IV heparin drip which was discontinued. No chest pain reported. Echo shows normal LV function without wall motion abnormalities. Cardiology consultation and follow-up appreciated: Recommending outpatient stress test & follow-up with Dr. Elease Hashimoto. Continue aspirin, Plavix, atenolol and statins. 5. Lactic acidosis: Secondary to acute illness. Resolved. 6. Essential hypertension: Mildly uncontrolled. Resumed home ACEI and HCTZ. Continue atenolol. Added when necessary IV hydralazine. 7. DM 2: Mildly uncontrolled CBGs in the context of steroids. Continue NovoLog SSI. Added low-dose Lantus. Holding oral hypoglycemics. CBGs are better. Check A1c. 8. BPH: Continue Avodart and Fosamax. 9. Hypomagnesemia: Replaced 10. Acute urinary retention: Noted on 1/24. Bladder scan shows >999 ML. In and out catheterization as needed. Continue BPH medications. Overnight 1/24, needed in and out cath 1 and drained approximately 1200 ML. Has been able to void since then. Continue to monitor. 11. Anemia: Stable.   DVT prophylaxis: Lovenox. Was on heparin drip until 1/3. Code Status: Full Family Communication: None at bedside Disposition Plan: Admitted to stepdown. Continue management in stepdown for additional 24 hours and then reassess. DC home when medically stable.   Consultants:   Cardiology-signed off.  Procedures:   2-D echo 08/12/16: Study Conclusions  - Left ventricle: The cavity size was normal. Wall thickness was   increased in a pattern of mild LVH. Systolic function was normal.   The estimated ejection fraction was in the range of 55% to 60%.   Wall motion was normal; there were no regional wall motion   abnormalities. Features are consistent with a pseudonormal left  ventricular  filling pattern, with concomitant abnormal relaxation   and increased filling pressure (grade 2 diastolic dysfunction). - Aortic valve: Valve mobility was restricted. There was mild   stenosis. - Mitral valve: Calcified annulus. - Left atrium: The atrium was moderately dilated. - Right ventricle: Systolic function was mildly reduced. - Right atrium: The atrium was mildly dilated. - Pulmonary arteries: Systolic pressure was moderately increased.  Impressions:  - Normal LV systolic function; grade 2 diastolic dysfunction;   calcified aortic valve with mild AS; biatrial enlargement;   moderately elevated pulmonary pressure.  Antimicrobials:   Tamiflu 1/21 >  Zosyn and vancomycin 1 dose in ED    Subjective: Overnight events noted. Discussed with night and day RN. Back on BiPAP last night for hypoxia. Required in and out catheterization for acute urinary retention. Patient states that his dyspnea has significantly improved. Denies cough or chest pain.  Objective:  Vitals:   08/14/16 0400 08/14/16 0500 08/14/16 0700 08/14/16 0800  BP: (!) 141/63   (!) 97/55  Pulse: 66  66 70  Resp: (!) 24  16 20   Temp:    97.5 F (36.4 C)  TempSrc:    Oral  SpO2: 100%  100% 100%  Weight:  76.6 kg (168 lb 14 oz)    Height:        Intake/Output Summary (Last 24 hours) at 08/14/16 1040 Last data filed at 08/14/16 0031  Gross per 24 hour  Intake              120 ml  Output             1415 ml  Net            -1295 ml   Filed Weights   08/12/16 0439 08/13/16 0500 08/14/16 0500  Weight: 73.2 kg (161 lb 6 oz) 76.3 kg (168 lb 3.4 oz) 76.6 kg (168 lb 14 oz)    Examination:  General exam: Pleasant elderly male lying propped up in bed without distress. Looks improved compared to 1/24. Respiratory system: Much improved breath sounds compared to 1/24. Slightly harsh but decreased and few expiratory rhonchi. No crackles. No increased work of breathing. Cardiovascular system: S1 & S2 heard,  RRR. No JVD, murmurs, rubs, gallops or clicks. No pedal edema. Telemetry: Sinus rhythm. Gastrointestinal system: Abdomen is nondistended, soft and nontender. No organomegaly or masses felt. Normal bowel sounds heard. Central nervous system: Alert and oriented. No focal neurological deficits. Extremities: Symmetric 5 x 5 power. Skin: No rashes, lesions or ulcers Psychiatry: Judgement and insight appear normal. Mood & affect appropriate.     Data Reviewed: I have personally reviewed following labs and imaging studies  CBC:  Recent Labs Lab 08/11/16 1147 08/11/16 1811 08/12/16 0637 08/14/16 0340  WBC 12.6* 14.4* 16.3* 18.5*  NEUTROABS 10.6*  --   --   --   HGB 13.8 13.1 11.3* 12.5*  HCT 41.9 40.7 35.0* 39.3  MCV 92.1 92.9 92.3 94.5  PLT 201 199 176 216   Basic Metabolic Panel:  Recent Labs Lab 08/11/16 1147 08/11/16 1811 08/11/16 2000 08/11/16 2019 08/12/16 0339 08/13/16 0326  NA 139  --  138  --  138 138  K 4.6  --  3.2*  --  4.2 4.9  CL 96*  --  100*  --  103 105  CO2 33*  --  25  --  28 28  GLUCOSE 203*  --  348*  --  196* 171*  BUN 17  --  19  --  17 20  CREATININE 0.93 1.28* 1.25*  --  0.89 0.74  CALCIUM 9.8  --  8.1*  --  8.1* 8.2*  MG  --   --   --  1.1* 3.2* 2.2  PHOS  --   --   --  2.8 3.5 3.3   GFR: Estimated Creatinine Clearance: 72.7 mL/min (by C-G formula based on SCr of 0.74 mg/dL). Liver Function Tests:  Recent Labs Lab 08/11/16 1147 08/12/16 0339  AST 25 35  ALT 16* 14*  ALKPHOS 96 69  BILITOT 1.2 0.7  PROT 7.5 6.0*  ALBUMIN 4.7 3.6   No results for input(s): LIPASE, AMYLASE in the last 168 hours. No results for input(s): AMMONIA in the last 168 hours. Coagulation Profile:  Recent Labs Lab 08/12/16 0339  INR 1.20   Cardiac Enzymes:  Recent Labs Lab 08/11/16 2000 08/12/16 0339 08/12/16 1247  TROPONINI 0.45* 1.56* 1.11*   BNP (last 3 results) No results for input(s): PROBNP in the last 8760 hours. HbA1C: No results for  input(s): HGBA1C in the last 72 hours. CBG:  Recent Labs Lab 08/13/16 0713 08/13/16 1113 08/13/16 1522 08/13/16 2134 08/14/16 0745  GLUCAP 154* 240* 171* 156* 157*   Lipid Profile: No results for input(s): CHOL, HDL, LDLCALC, TRIG, CHOLHDL, LDLDIRECT in the last 72 hours. Thyroid Function Tests: No results for input(s): TSH, T4TOTAL, FREET4, T3FREE, THYROIDAB in the last 72 hours. Anemia Panel: No results for input(s): VITAMINB12, FOLATE, FERRITIN, TIBC, IRON, RETICCTPCT in the last 72 hours.  Sepsis Labs:  Recent Labs Lab 08/11/16 1147  08/11/16 1811 08/11/16 2000 08/11/16 2249 08/12/16 0339 08/12/16 0637 08/12/16 1121 08/14/16 0340  PROCALCITON  --   --   --  0.30  --  0.43  --   --   --   WBC 12.6*  --  14.4*  --   --   --  16.3*  --  18.5*  LATICACIDVEN  --   < > 8.6* 6.6* 3.2*  --   --  0.7  --   < > = values in this interval not displayed.  Recent Results (from the past 240 hour(s))  MRSA PCR Screening     Status: None   Collection Time: 08/11/16  6:07 PM  Result Value Ref Range Status   MRSA by PCR NEGATIVE NEGATIVE Final    Comment:        The GeneXpert MRSA Assay (FDA approved for NASAL specimens only), is one component of a comprehensive MRSA colonization surveillance program. It is not intended to diagnose MRSA infection nor to guide or monitor treatment for MRSA infections.          Radiology Studies: Dg Chest Port 1 View  Result Date: 08/13/2016 CLINICAL DATA:  Hypoxia. EXAM: PORTABLE CHEST 1 VIEW COMPARISON:  And 08/12/2016 FINDINGS: 1837 hours. Markedly low lung volumes, as before. Elevation left hemidiaphragm with displacement of cardiomediastinal anatomy into the right hemithorax. No evidence for overt airspace pulmonary edema. Heart size is stable. The visualized bony structures of the thorax are intact. Telemetry leads overlie the chest. IMPRESSION: Stable.  No acute findings. Electronically Signed   By: Kennith Center M.D.   On:  08/13/2016 19:22   Dg Chest Port 1 View  Result Date: 08/12/2016 CLINICAL DATA:  Respiratory failure EXAM: PORTABLE CHEST 1 VIEW COMPARISON:  08/11/2016 FINDINGS: Stable significant elevation of the left hemidiaphragm. Cardiomegaly, stable no definite acute airspace opacity or effusion. No acute bony abnormality. IMPRESSION: Stable  elevation of the left hemidiaphragm and cardiomegaly. No definite acute process. Electronically Signed   By: Charlett NoseKevin  Dover M.D.   On: 08/12/2016 11:55        Scheduled Meds: . aspirin EC  81 mg Oral QHS  . atenolol  25 mg Oral QHS  . atorvastatin  40 mg Oral QHS  . clopidogrel  75 mg Oral QHS  . dutasteride  0.5 mg Oral QHS  . enoxaparin (LOVENOX) injection  40 mg Subcutaneous Q24H  . hydrochlorothiazide  25 mg Oral QHS  . insulin aspart  0-5 Units Subcutaneous QHS  . insulin aspart  0-9 Units Subcutaneous TID WC  . insulin glargine  5 Units Subcutaneous Daily  . ipratropium-albuterol  3 mL Nebulization Q4H  . methylPREDNISolone (SOLU-MEDROL) injection  60 mg Intravenous Q8H  . oseltamivir  30 mg Oral BID  . ramipril  10 mg Oral QHS  . sodium chloride flush  3 mL Intravenous Q12H  . sodium chloride flush  3 mL Intravenous Q12H  . tamsulosin  0.4 mg Oral QHS   Continuous Infusions:   LOS: 3 days        [facility] At Tan[facility] LLCNGALGI,Saleema Weppler, MD [facility] Pager [phone]  If 7PM-7AM, please contact night-coverage [email or web address] Password [email or web address] 08/14/2016, 10:40 AM

## 2016-08-15 DIAGNOSIS — J441 Chronic obstructive pulmonary disease with (acute) exacerbation: Secondary | ICD-10-CM

## 2016-08-15 DIAGNOSIS — J986 Disorders of diaphragm: Secondary | ICD-10-CM

## 2016-08-15 DIAGNOSIS — J810 Acute pulmonary edema: Secondary | ICD-10-CM

## 2016-08-15 LAB — URINALYSIS, ROUTINE W REFLEX MICROSCOPIC
Bilirubin Urine: NEGATIVE
Glucose, UA: >=500 mg/dL — AB
Ketones, ur: NEGATIVE mg/dL
Leukocytes, UA: NEGATIVE
Nitrite: NEGATIVE
Protein, ur: NEGATIVE mg/dL
Specific Gravity, Urine: 1.018 (ref 1.005–1.030)
Squamous Epithelial / LPF: NONE SEEN
pH: 5 (ref 5.0–8.0)

## 2016-08-15 LAB — GLUCOSE, CAPILLARY
Glucose-Capillary: 215 mg/dL — ABNORMAL HIGH (ref 65–99)
Glucose-Capillary: 281 mg/dL — ABNORMAL HIGH (ref 65–99)
Glucose-Capillary: 234 mg/dL — ABNORMAL HIGH (ref 65–99)
Glucose-Capillary: 115 mg/dL — ABNORMAL HIGH (ref 65–99)

## 2016-08-15 LAB — HEMOGLOBIN A1C
Hgb A1c MFr Bld: 7.7 % — ABNORMAL HIGH (ref 4.8–5.6)
Mean Plasma Glucose: 174 mg/dL

## 2016-08-15 MED ORDER — FUROSEMIDE 10 MG/ML IJ SOLN
40.0000 mg | Freq: Once | INTRAMUSCULAR | Status: AC
  Administered 2016-08-15: 40 mg via INTRAVENOUS
  Filled 2016-08-15: qty 4

## 2016-08-15 MED ORDER — METHYLPREDNISOLONE SODIUM SUCC 125 MG IJ SOLR
60.0000 mg | Freq: Two times a day (BID) | INTRAMUSCULAR | Status: DC
  Administered 2016-08-15 – 2016-08-16 (×2): 60 mg via INTRAVENOUS
  Filled 2016-08-15 (×2): qty 2

## 2016-08-15 NOTE — Progress Notes (Signed)
Pt currently on 15 LPM [place] and tolerating well at this time.  Pt is in no distress, BIPAP not needed at this time.  RT to monitor and assess as needed.

## 2016-08-15 NOTE — Consult Note (Signed)
Name: Angel [name] MRN: [id number] DOB: 03/03/1936    ADMISSION DATE:  08/11/2016 CONSULTATION DATE:  1/26  REFERRING MD :  Waymon AmatoHongalgi   CHIEF COMPLAINT:  Hypoxia   BRIEF PATIENT DESCRIPTION:  81 year old male w/ known h/o paralyzed left HD. Admitted 1/22 w/ influenza A and acute hypoxic respiratory failure. Treated w/ tamiflu and intermittent BIPAP. PCCM asked to see 1/26 as still needing higher levels of oxygen.   SIGNIFICANT EVENTS    STUDIES:   ECHO 1/23 - Normal LV systolic function; grade 2 diastolic dysfunction;   calcified aortic valve with mild AS; biatrial enlargement;   moderately elevated pulmonary pressure. - Left atrium: The atrium was moderately dilated. - Right ventricle: Systolic function was mildly reduced. - Right atrium: The atrium was mildly dilated. - Pulmonary arteries: Systolic pressure was moderately increased. CT chest 1/22: marked volume loss on left. No edema or infiltrate   HISTORY OF PRESENT ILLNESS:   This is a 81 year old male w/ sig medical h/o left paralyzed hemidiaphragm (not clear as to why) CAD, HTN and DM. Was admitted by the IM service on 1/22 w/ 3d h/o worsening shortness of breath w/ associated wheezing and dry, non-productive cough. On presentation his O2 sats were 83% on room air, dx eval eventually identified that he was Influenza A positive. He was admitted to the SDU, treated w/ tamiflu X 5d, NIPPV and BDs for systemic steroids for wheezing and bronchospasm. In spite of what seems to be appropriate treatment the patient continues to require high levels of oxygen 5-6 liters and intermittent BIPAP (last used last night ) and only able to stay off BIPAP for 2-3 hrs then desaturates. PCCM asked to see on 1/27 given concern for slow progression.   PAST MEDICAL HISTORY :   has a past medical history of Benign prostatic hypertrophy; Coronary artery disease; Diabetes mellitus; Hyperlipidemia; Hypertension; Paralyzed hemidiaphragm; and  Pneumonia.  has no past surgical history on file. Prior to Admission medications   Medication Sig Start Date End Date Taking? Authorizing Provider  aspirin 81 MG tablet Take 81 mg by mouth at bedtime.    Yes Historical Provider, MD  atenolol (TENORMIN) 25 MG tablet Take 1 tablet (25 mg total) by mouth daily. Patient taking differently: Take 25 mg by mouth at bedtime.  04/07/11  Yes Vesta MixerPhilip J Nahser, MD  atorvastatin (LIPITOR) 40 MG tablet TAKE 1 TABLET BY MOUTH EVERY DAY Patient taking differently: TAKE 40mg  TABLET BY MOUTH at night 03/03/16  Yes Vesta MixerPhilip J Nahser, MD  clopidogrel (PLAVIX) 75 MG tablet Take 75 mg by mouth at bedtime. 07/28/16  Yes Historical Provider, MD  dutasteride (AVODART) 0.5 MG capsule Take 0.5 mg by mouth at bedtime.    Yes Historical Provider, MD  glipiZIDE-metformin (METAGLIP) 5-500 MG per tablet Take 1-1.5 tablets by mouth daily. Take 1 tablet in the morning and 1 and a half at night 04/08/13  Yes Historical Provider, MD  hydrochlorothiazide (HYDRODIURIL) 25 MG tablet Take 25 mg by mouth at bedtime.    Yes Historical Provider, MD  naproxen sodium (ANAPROX) 220 MG tablet Take 440 mg by mouth at bedtime.   Yes Historical Provider, MD  nitroGLYCERIN (NITROSTAT) 0.4 MG SL tablet Place 1 tablet (0.4 mg total) under the tongue as needed. 07/09/12  Yes Vesta MixerPhilip J Nahser, MD  orphenadrine (NORFLEX) 100 MG tablet Take 100 mg by mouth 2 (two) times daily as needed for muscle spasms.  11/28/13  Yes Historical Provider, MD  ramipril (ALTACE) 10 MG capsule TAKE 1 CAPSULE BY MOUTH DAILY Patient taking differently: TAKE 10mg  CAPSULE BY MOUTH at night 06/29/13  Yes Vesta Mixer, MD  Tamsulosin HCl (FLOMAX) 0.4 MG CAPS Take 0.4 mg by mouth at bedtime.    Yes Historical Provider, MD   No Known Allergies  FAMILY HISTORY:  family history includes Cancer in his brother and father. SOCIAL HISTORY:  reports that he has never smoked. He has never used smokeless tobacco. He reports that he drinks  alcohol.  REVIEW OF SYSTEMS:   Constitutional: Negative for fever, chills, weight loss, malaise/fatigue and diaphoresis over the last 48hrs HENT: Negative for hearing loss, ear pain, nosebleeds, congestion, sore throat, neck pain, tinnitus and ear discharge.   Eyes: Negative for blurred vision, double vision, photophobia, pain, discharge and redness.  Respiratory: Negative for cough, hemoptysis, sputum production, still has shortness of breath, + wheezing and stridor.   Cardiovascular: Negative for chest pain, palpitations, orthopnea, claudication, leg swelling and PND.  Gastrointestinal: Negative for heartburn, nausea, vomiting, abdominal pain, diarrhea, constipation, blood in stool and melena.  Genitourinary: Negative for dysuria, urgency, frequency, hematuria and flank pain. does have hesitancy and some retention  Musculoskeletal: Negative for myalgias, back pain, joint pain and falls.  Skin: Negative for itching and rash.  Neurological: Negative for dizziness, tingling, tremors, sensory change, speech change, focal weakness, seizures, loss of consciousness, weakness and headaches.  Endo/Heme/Allergies: Negative for environmental allergies and polydipsia. Does not bruise/bleed easily.  SUBJECTIVE:  No distress but asking for "the mask" VITAL SIGNS: Temp:  [97.2 F (36.2 C)-98.3 F (36.8 C)] 97.2 F (36.2 C) (01/26 0325) Pulse Rate:  [58-81] 65 (01/26 0930) Resp:  [13-26] 14 (01/26 0930) BP: (119-138)/(60-65) 128/60 (01/26 0800) SpO2:  [86 %-100 %] 97 % (01/26 0930) FiO2 (%):  [50 %-60 %] 50 % (01/26 0312) Weight:  [170 lb 3.1 oz (77.2 kg)] 170 lb 3.1 oz (77.2 kg) (01/26 0500)  Intake/Output Summary (Last 24 hours) at 08/15/16 1023 Last data filed at 08/15/16 0600  Gross per 24 hour  Intake                0 ml  Output              375 ml  Net             -375 ml   PHYSICAL EXAMINATION: General appearance:  81 Year old  Male well nourished,  NAD, currently in acute distress,   Conversant. English is second language  Eyes: anicteric, moist conjunctivae; PERRL, EOMI bilaterally. Mouth:  membranes and no mucosal ulcerations; normal hard and soft palate Neck: Trachea midline; neck supple, + JVD Lungs/chest: diffuse wheeze. Decreased on the left. No sig accessory use.  CV: RRR, no MRGs  Abdomen: Soft, non-tender; no masses or HSM Extremities: No peripheral edema or extremity lymphadenopathy Skin: Normal temperature, turgor and texture; no rash, ulcers or subcutaneous nodules scattered areas of ecchymosis. Particularly on left arm Psych: Appropriate affect, alert and oriented to person, place and time   Recent Labs Lab 08/11/16 2000 08/12/16 0339 08/13/16 0326  NA 138 138 138  K 3.2* 4.2 4.9  CL 100* 103 105  CO2 25 28 28   BUN 19 17 20   CREATININE 1.25* 0.89 0.74  GLUCOSE 348* 196* 171*    Recent Labs Lab 08/11/16 1811 08/12/16 0637 08/14/16 0340  HGB 13.1 11.3* 12.5*  HCT 40.7 35.0* 39.3  WBC 14.4* 16.3* 18.5*  PLT 199 176 216  Dg Chest Port 1 View  Result Date: 08/13/2016 CLINICAL DATA:  Hypoxia. EXAM: PORTABLE CHEST 1 VIEW COMPARISON:  And 08/12/2016 FINDINGS: 1837 hours. Markedly low lung volumes, as before. Elevation left hemidiaphragm with displacement of cardiomediastinal anatomy into the right hemithorax. No evidence for overt airspace pulmonary edema. Heart size is stable. The visualized bony structures of the thorax are intact. Telemetry leads overlie the chest. IMPRESSION: Stable.  No acute findings. Electronically Signed   By: Kennith Center M.D.   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 He has low volume chest films at baseline. Suspect that a little extra volume would equate to significant symptomatic burden given his baseline restrictive lung disease. Doubt PE. Has been on LMWH since admit. Does not appear like we are missing a new PNA  Plan Lasix x1 Trend BNP Mobilize Cont BDs Cont BIPAP PRN Cont steroids, but decrease to q12,  think wheezing is more from volume overload than actual bronchospasm.   Simonne Martinet ACNP-BC [facility] Pulmonary/Critical Care Pager # [phone] OR # [phone] if no answer   08/15/2016, 9:34 AM

## 2016-08-15 NOTE — Progress Notes (Addendum)
PROGRESS NOTE  Angel [name]  ZOX:[id number] DOB: [birth year]  DOA: 08/11/2016 PCP: Jackie Plum, MD   Brief Narrative:  81 year old male with PMH of CAD status post LAD stenting, DM 2, HLD, HTN, BPH, paralyzed left diaphragm,? Asthma, lifelong nonsmoker, presented to ED on 08/11/16 with 3 days history of progressively worsening dyspnea, dry cough and wheezing. He was admitted to stepdown unit for acute hypoxic respiratory failure secondary to influenza related respiratory illness and possible asthma exacerbation. Despite aggressive treatment, continued to be hypoxic requiring high-volume oxygen and intermittent BiPAP. Pulmonology consulted on 1/26 and suspect some of this is related to chronically paralyzed left hemidiaphragm and decompensated diastolic CHF.   Assessment & Plan:   Principal Problem:   Respiratory failure (HCC) Active Problems:   CAD (coronary artery disease)   Diabetes mellitus (HCC)   HTN (hypertension)   Hyperlipidemia   COPD exacerbation (HCC)   Influenza   Hypertensive heart disease without heart failure   Demand ischemia (HCC)   S/P coronary artery stent placement   1. Influenza A with acute viral bronchitis &? Asthma exacerbation: No formal diagnosis of asthma or COPD. Influenza A PCR positive and B PCR negative. Complete 5 day course of Tamiflu. Supportive treatment. Patient states that he was using when necessary albuterol inhaler at home, provided by his cardiologist, for "diaphragm weakness". 2. Acute respiratory failure with hypoxia: Has required high-volume oxygen and intermittent BiPAP since admission. Initially felt to be related to acute viral bronchitis from influenza and possible asthma exacerbation and treated likewise. Despite this treatment, continued to be hypoxic and hence pulmonology was consulted on 1/26. As per pulmonology, acute hypoxic respiratory failure is multifactorial related to influenza A complicated by acute diastolic CHF, decompensated  cor pulmonale with elevated PAP's in the setting of severe restrictive lung disease due to left hemidiaphragm paralysis and they doubt PE. Treating today with Lasix IV 1 dose, continued bronchodilator nebulizations, when necessary BiPAP, reducing IV Solu-Medrol. Repeat chest x-ray 1/24 without acute findings.  3. Severe sepsis secondary to influenza: Sepsis physiology improved. Received a dose of vancomycin and Zosyn in the ED which were not continued due to lack of definitive source of bacterial infection. 4. Elevated troponin/history of CAD status post LAD stenting in 2004: Secondary to demand ischemia from acute illness. EKG without acute changes. Troponin has decreased. Briefly on IV heparin drip which was discontinued. No chest pain reported. Echo shows normal LV function without wall motion abnormalities. Cardiology consultation and follow-up appreciated: Recommending outpatient stress test & follow-up with Dr. Elease Hashimoto. Continue aspirin, Plavix, atenolol and statins. 5. Lactic acidosis: Secondary to acute illness. Resolved. 6. Essential hypertension: controlled. Resumed home ACEI and HCTZ. Continue atenolol. Added when necessary IV hydralazine. 7. DM 2: Mildly uncontrolled CBGs in the context of steroids. Continue NovoLog SSI, moderate sensitivity. Increased Lantus to 10 units daily. Holding oral hypoglycemics. CBGs are better. A1c: 7.7. 8. BPH: Continue Avodart and Fosamax. 9. Hypomagnesemia: Replaced 10. Acute urinary retention: Noted on 1/24. Bladder scan showed >999 ML. In and out catheterization as needed. Continue BPH medications. Overnight 1/24, needed in and out cath 1 and drained approximately 1200 ML. Has been able to void since then. Continue to monitor. Patient complained of dysuria last night but has improved since then. Check urine microscopy and culture. 11. Anemia: Stable. 12. Chronic left hemidiaphragm paralysis: Management as above. Mobilize and aggressive pulmonary  toilet.   DVT prophylaxis: Lovenox. Was on heparin drip until 1/3. Code Status: Full Family Communication: None at bedside. Left  message for patient's spouse to call back so I can update his care. Patient's daughter called and I updated her regarding his care.  Disposition Plan: Admitted to stepdown. Continue management in stepdown until stable to transfer to medical floor. DC home when medically stable.   Consultants:   Cardiology-signed off.  Pulmonology  Procedures:   2-D echo 08/12/16: Study Conclusions  - Left ventricle: The cavity size was normal. Wall thickness was   increased in a pattern of mild LVH. Systolic function was normal.   The estimated ejection fraction was in the range of 55% to 60%.   Wall motion was normal; there were no regional wall motion   abnormalities. Features are consistent with a pseudonormal left   ventricular filling pattern, with concomitant abnormal relaxation   and increased filling pressure (grade 2 diastolic dysfunction). - Aortic valve: Valve mobility was restricted. There was mild   stenosis. - Mitral valve: Calcified annulus. - Left atrium: The atrium was moderately dilated. - Right ventricle: Systolic function was mildly reduced. - Right atrium: The atrium was mildly dilated. - Pulmonary arteries: Systolic pressure was moderately increased.  Impressions:  - Normal LV systolic function; grade 2 diastolic dysfunction;   calcified aortic valve with mild AS; biatrial enlargement;   moderately elevated pulmonary pressure.   BiPAP when necessary  Antimicrobials:   Tamiflu 1/21 >  Zosyn and vancomycin 1 dose in ED    Subjective: Dysuria last night but has since improved. Has not required repeat in and out cath but complains of some difficulty voiding. States that his dyspnea continues to improve. However per nursing, was on BiPAP all night last night until this morning due to desaturation in the mid 80s.  Objective:  Vitals:    08/15/16 1000 08/15/16 1011 08/15/16 1013 08/15/16 1014  BP:      Pulse: 89 80 75 73  Resp: (!) 21 (!) 22 17 16   Temp:      TempSrc:      SpO2: 96% (!) 84% 93% 92%  Weight:      Height:      Temperature 97.59F. Blood pressure 128/60 mmHg.  Intake/Output Summary (Last 24 hours) at 08/15/16 1050 Last data filed at 08/15/16 0600  Gross per 24 hour  Intake                0 ml  Output              375 ml  Net             -375 ml   Filed Weights   08/13/16 0500 08/14/16 0500 08/15/16 0500  Weight: 76.3 kg (168 lb 3.4 oz) 76.6 kg (168 lb 14 oz) 77.2 kg (170 lb 3.1 oz)    Examination:  General exam: Pleasant elderly male lying propped up in bed without distress.  Respiratory system: Fair breath sounds bilaterally. Few bilateral expiratory rhonchi. No increased work of breathing. Cardiovascular system: S1 & S2 heard, RRR. No JVD, murmurs, rubs, gallops or clicks. No pedal edema. Telemetry: Sinus rhythm. Gastrointestinal system: Abdomen is nondistended, soft and nontender. No organomegaly or masses felt. Normal bowel sounds heard. Central nervous system: Alert and oriented. No focal neurological deficits. Extremities: Symmetric 5 x 5 power. Skin: No rashes, lesions or ulcers Psychiatry: Judgement and insight appear normal. Mood & affect appropriate.     Data Reviewed: I have personally reviewed following labs and imaging studies  CBC:  Recent Labs Lab 08/11/16 1147 08/11/16 1811 08/12/16 0637 08/14/16  0340  WBC 12.6* 14.4* 16.3* 18.5*  NEUTROABS 10.6*  --   --   --   HGB 13.8 13.1 11.3* 12.5*  HCT 41.9 40.7 35.0* 39.3  MCV 92.1 92.9 92.3 94.5  PLT 201 199 176 216   Basic Metabolic Panel:  Recent Labs Lab 08/11/16 1147 08/11/16 1811 08/11/16 2000 08/11/16 2019 08/12/16 0339 08/13/16 0326  NA 139  --  138  --  138 138  K 4.6  --  3.2*  --  4.2 4.9  CL 96*  --  100*  --  103 105  CO2 33*  --  25  --  28 28  GLUCOSE 203*  --  348*  --  196* 171*  BUN 17  --   19  --  17 20  CREATININE 0.93 1.28* 1.25*  --  0.89 0.74  CALCIUM 9.8  --  8.1*  --  8.1* 8.2*  MG  --   --   --  1.1* 3.2* 2.2  PHOS  --   --   --  2.8 3.5 3.3   GFR: Estimated Creatinine Clearance: 73 mL/min (by C-G formula based on SCr of 0.74 mg/dL). Liver Function Tests:  Recent Labs Lab 08/11/16 1147 08/12/16 0339  AST 25 35  ALT 16* 14*  ALKPHOS 96 69  BILITOT 1.2 0.7  PROT 7.5 6.0*  ALBUMIN 4.7 3.6   No results for input(s): LIPASE, AMYLASE in the last 168 hours. No results for input(s): AMMONIA in the last 168 hours. Coagulation Profile:  Recent Labs Lab 08/12/16 0339  INR 1.20   Cardiac Enzymes:  Recent Labs Lab 08/11/16 2000 08/12/16 0339 08/12/16 1247  TROPONINI 0.45* 1.56* 1.11*   BNP (last 3 results) No results for input(s): PROBNP in the last 8760 hours. HbA1C:  Recent Labs  08/14/16 0340  HGBA1C 7.7*   CBG:  Recent Labs Lab 08/14/16 0745 08/14/16 1111 08/14/16 1729 08/14/16 2107 08/15/16 0755  GLUCAP 157* 238* 262* 221* 215*   Lipid Profile: No results for input(s): CHOL, HDL, LDLCALC, TRIG, CHOLHDL, LDLDIRECT in the last 72 hours. Thyroid Function Tests: No results for input(s): TSH, T4TOTAL, FREET4, T3FREE, THYROIDAB in the last 72 hours. Anemia Panel: No results for input(s): VITAMINB12, FOLATE, FERRITIN, TIBC, IRON, RETICCTPCT in the last 72 hours.  Sepsis Labs:  Recent Labs Lab 08/11/16 1147  08/11/16 1811 08/11/16 2000 08/11/16 2249 08/12/16 0339 08/12/16 0637 08/12/16 1121 08/14/16 0340  PROCALCITON  --   --   --  0.30  --  0.43  --   --   --   WBC 12.6*  --  14.4*  --   --   --  16.3*  --  18.5*  LATICACIDVEN  --   < > 8.6* 6.6* 3.2*  --   --  0.7  --   < > = values in this interval not displayed.  Recent Results (from the past 240 hour(s))  MRSA PCR Screening     Status: None   Collection Time: 08/11/16  6:07 PM  Result Value Ref Range Status   MRSA by PCR NEGATIVE NEGATIVE Final    Comment:        The  GeneXpert MRSA Assay (FDA approved for NASAL specimens only), is one component of a comprehensive MRSA colonization surveillance program. It is not intended to diagnose MRSA infection nor to guide or monitor treatment for MRSA infections.          Radiology Studies: Dg Chest Port 1  View  Result Date: 08/13/2016 CLINICAL DATA:  Hypoxia. EXAM: PORTABLE CHEST 1 VIEW COMPARISON:  And 08/12/2016 FINDINGS: 1837 hours. Markedly low lung volumes, as before. Elevation left hemidiaphragm with displacement of cardiomediastinal anatomy into the right hemithorax. No evidence for overt airspace pulmonary edema. Heart size is stable. The visualized bony structures of the thorax are intact. Telemetry leads overlie the chest. IMPRESSION: Stable.  No acute findings. Electronically Signed   By: Kennith Center M.D.   On: 08/13/2016 19:22        Scheduled Meds: . aspirin EC  81 mg Oral QHS  . atenolol  25 mg Oral QHS  . atorvastatin  40 mg Oral QHS  . clopidogrel  75 mg Oral QHS  . dutasteride  0.5 mg Oral QHS  . enoxaparin (LOVENOX) injection  40 mg Subcutaneous Q24H  . furosemide  40 mg Intravenous Once  . hydrochlorothiazide  25 mg Oral QHS  . insulin aspart  0-15 Units Subcutaneous TID WC  . insulin aspart  0-5 Units Subcutaneous QHS  . insulin glargine  10 Units Subcutaneous Daily  . ipratropium-albuterol  3 mL Nebulization Q4H  . methylPREDNISolone (SOLU-MEDROL) injection  60 mg Intravenous Q8H  . oseltamivir  30 mg Oral BID  . ramipril  10 mg Oral QHS  . sodium chloride flush  3 mL Intravenous Q12H  . sodium chloride flush  3 mL Intravenous Q12H  . tamsulosin  0.4 mg Oral QHS   Continuous Infusions:   LOS: 4 days        [facility], MD [facility] Pager [phone]  If 7PM-7AM, please contact night-coverage [email or web address] Password [email or web address] 08/15/2016, 10:50 AM

## 2016-08-16 DIAGNOSIS — J09X2 Influenza due to identified novel influenza A virus with other respiratory manifestations: Secondary | ICD-10-CM

## 2016-08-16 DIAGNOSIS — I5031 Acute diastolic (congestive) heart failure: Secondary | ICD-10-CM

## 2016-08-16 DIAGNOSIS — J4541 Moderate persistent asthma with (acute) exacerbation: Secondary | ICD-10-CM

## 2016-08-16 LAB — GLUCOSE, CAPILLARY
Glucose-Capillary: 189 mg/dL — ABNORMAL HIGH (ref 65–99)
Glucose-Capillary: 275 mg/dL — ABNORMAL HIGH (ref 65–99)
Glucose-Capillary: 244 mg/dL — ABNORMAL HIGH (ref 65–99)
Glucose-Capillary: 181 mg/dL — ABNORMAL HIGH (ref 65–99)

## 2016-08-16 LAB — BASIC METABOLIC PANEL
Anion gap: 6 (ref 5–15)
BUN: 35 mg/dL — ABNORMAL HIGH (ref 6–20)
CO2: 45 mmol/L — ABNORMAL HIGH (ref 22–32)
Calcium: 9.1 mg/dL (ref 8.9–10.3)
Chloride: 84 mmol/L — ABNORMAL LOW (ref 101–111)
Creatinine, Ser: 0.84 mg/dL (ref 0.61–1.24)
GFR calc Af Amer: >60 mL/min (ref >60)
GFR calc non Af Amer: >60 mL/min (ref >60)
Glucose, Bld: 192 mg/dL — ABNORMAL HIGH (ref 65–99)
Potassium: 4.3 mmol/L (ref 3.5–5.1)
Sodium: 135 mmol/L (ref 135–145)

## 2016-08-16 LAB — BRAIN NATRIURETIC PEPTIDE: B Natriuretic Peptide: 156.8 pg/mL — ABNORMAL HIGH (ref 0.0–100.0)

## 2016-08-16 MED ORDER — METHYLPREDNISOLONE SODIUM SUCC 40 MG IJ SOLR: 40.0000 mg | Freq: Two times a day (BID) | INTRAMUSCULAR | Status: DC

## 2016-08-16 MED ORDER — FUROSEMIDE 10 MG/ML IJ SOLN
40.0000 mg | Freq: Four times a day (QID) | INTRAMUSCULAR | Status: AC
  Administered 2016-08-16 (×2): 40 mg via INTRAVENOUS
  Filled 2016-08-16 (×2): qty 4

## 2016-08-16 MED ORDER — METHYLPREDNISOLONE SODIUM SUCC 40 MG IJ SOLR
40.0000 mg | Freq: Every day | INTRAMUSCULAR | Status: DC
  Administered 2016-08-16: 40 mg via INTRAVENOUS
  Filled 2016-08-16: qty 1

## 2016-08-16 MED ORDER — FUROSEMIDE 10 MG/ML IJ SOLN
20.0000 mg | Freq: Once | INTRAMUSCULAR | Status: AC
  Administered 2016-08-16: 20 mg via INTRAVENOUS
  Filled 2016-08-16: qty 2

## 2016-08-16 MED ORDER — IPRATROPIUM-ALBUTEROL 0.5-2.5 (3) MG/3ML IN SOLN
3.0000 mL | Freq: Four times a day (QID) | RESPIRATORY_TRACT | Status: DC
  Administered 2016-08-16 – 2016-08-20 (×18): 3 mL via RESPIRATORY_TRACT
  Filled 2016-08-16 (×18): qty 3

## 2016-08-16 MED ORDER — HALOPERIDOL LACTATE 5 MG/ML IJ SOLN
2.0000 mg | Freq: Once | INTRAMUSCULAR | Status: AC
  Administered 2016-08-16: 2 mg via INTRAVENOUS
  Filled 2016-08-16: qty 1

## 2016-08-16 NOTE — Progress Notes (Signed)
PROGRESS NOTE  Angel [name]  UJW:[id number] DOB: 01/01/1936  DOA: 08/11/2016 PCP: Jackie Plum, MD   Brief Narrative:  81 year old male with PMH of CAD status post LAD stenting, DM 2, HLD, HTN, BPH, paralyzed left diaphragm,? Asthma, lifelong nonsmoker, presented to ED on 08/11/16 with 3 days history of progressively worsening dyspnea, dry cough and wheezing. He was admitted to stepdown unit for acute hypoxic respiratory failure secondary to influenza related respiratory illness and possible asthma exacerbation. Despite aggressive treatment, continued to be hypoxic requiring high-volume oxygen and intermittent BiPAP. Pulmonology consulted on 1/26 and suspect some of this is related to chronically paralyzed left hemidiaphragm and decompensated diastolic CHF.   Assessment & Plan:   Principal Problem:   Respiratory failure (HCC) Active Problems:   CAD (coronary artery disease)   Diabetes mellitus (HCC)   HTN (hypertension)   Hyperlipidemia   COPD exacerbation (HCC)   Influenza   Hypertensive heart disease without heart failure   Demand ischemia (HCC)   S/P coronary artery stent placement   1. Influenza A with acute viral bronchitis &? Asthma exacerbation: No formal diagnosis of asthma or COPD. Influenza A PCR positive and B PCR negative. Completed 5 day course of Tamiflu. Supportive treatment. No clinical bronchospasm > continue to taper steroids. 2. Acute respiratory failure with hypoxia: Has required high-volume oxygen and intermittent BiPAP since admission. Initially felt to be related to acute viral bronchitis from influenza and possible asthma exacerbation and treated likewise. Despite this treatment, continued to be hypoxic and hence pulmonology was consulted on 1/26. As per pulmonology, acute hypoxic respiratory failure is multifactorial related to influenza A complicated by acute diastolic CHF, decompensated cor pulmonale with elevated PAP's in the setting of severe restrictive lung  disease due to left hemidiaphragm paralysis and they doubt PE. Treated with Lasix IV 1 dose, continued bronchodilator nebulizations, when necessary BiPAP, reducing IV Solu-Medrol. Repeat chest x-ray 1/24 without acute findings. -3.5 L yesterday. Needed BiPAP overnight and currently on oxygen 15 L/m. Mobilize. Incentive spirometry. Mx per pulmonology. 3. Severe sepsis secondary to influenza: Sepsis physiology improved. Received a dose of vancomycin and Zosyn in the ED which were not continued due to lack of definitive source of bacterial infection. 4. Elevated troponin/history of CAD status post LAD stenting in 2004: Secondary to demand ischemia from acute illness. EKG without acute changes. Troponin has decreased. Briefly on IV heparin drip which was discontinued. No chest pain reported. Echo shows normal LV function without wall motion abnormalities. Cardiology consultation and follow-up appreciated: Recommending outpatient stress test & follow-up with Dr. Elease Hashimoto. Continue aspirin, Plavix, atenolol and statins. 5. Lactic acidosis: Secondary to acute illness. Resolved. 6. Essential hypertension: controlled. Resumed home ACEI and HCTZ. Continue atenolol. Added when necessary IV hydralazine. 7. DM 2: Mildly uncontrolled CBGs in the context of steroids. Continue NovoLog SSI, moderate sensitivity. Increased Lantus to 10 units daily. Holding oral hypoglycemics. CBGs are better. A1c: 7.7. 8. BPH: Continue Avodart and Fosamax. 9. Hypomagnesemia: Replaced 10. Acute urinary retention: Intermittent difficulty urinating which initially required in and out cath as needed but eventually placed Foley catheter on 1/26. Continue Foley cath for 48 hours then try voiding trial. Urine microscopy not suggestive of UTI. 11. Anemia: Stable. 12. Chronic left hemidiaphragm paralysis: Management as above. Mobilize and aggressive pulmonary toilet.   DVT prophylaxis: Lovenox. Was on heparin drip until 1/3. Code Status:  Full Family Communication: None at bedside. Discussed with patient's daughter on 1/26 and updated her regarding his care.  Disposition Plan: Admitted to  stepdown. Continue management in stepdown until stable to transfer to medical floor. DC home when medically stable.   Consultants:   Cardiology-signed off.  Pulmonology  Procedures:   2-D echo 08/12/16: Study Conclusions  - Left ventricle: The cavity size was normal. Wall thickness was   increased in a pattern of mild LVH. Systolic function was normal.   The estimated ejection fraction was in the range of 55% to 60%.   Wall motion was normal; there were no regional wall motion   abnormalities. Features are consistent with a pseudonormal left   ventricular filling pattern, with concomitant abnormal relaxation   and increased filling pressure (grade 2 diastolic dysfunction). - Aortic valve: Valve mobility was restricted. There was mild   stenosis. - Mitral valve: Calcified annulus. - Left atrium: The atrium was moderately dilated. - Right ventricle: Systolic function was mildly reduced. - Right atrium: The atrium was mildly dilated. - Pulmonary arteries: Systolic pressure was moderately increased.  Impressions:  - Normal LV systolic function; grade 2 diastolic dysfunction;   calcified aortic valve with mild AS; biatrial enlargement;   moderately elevated pulmonary pressure.   BiPAP when necessary  Foley 1/26>  Antimicrobials:   Tamiflu 1/21 >  Zosyn and vancomycin 1 dose in ED    Subjective: Overnight events noted. Discussed with RN. Required BiPAP for a few hours. Currently on high flow nasal cannula oxygen. Dyspnea improved but not at baseline.  Objective:  Vitals:   08/16/16 0400 08/16/16 0415 08/16/16 0434 08/16/16 0800  BP: (!) 136/59     Pulse: 70  70   Resp: (!) 28  19   Temp:    97 F (36.1 C)  TempSrc:    Axillary  SpO2: 99%  100%   Weight:  72.5 kg (159 lb 13.3 oz)    Height:         Intake/Output Summary (Last 24 hours) at 08/16/16 1104 Last data filed at 08/16/16 0600  Gross per 24 hour  Intake              100 ml  Output             3815 ml  Net            -3715 ml   Filed Weights   08/14/16 0500 08/15/16 0500 08/16/16 0415  Weight: 76.6 kg (168 lb 14 oz) 77.2 kg (170 lb 3.1 oz) 72.5 kg (159 lb 13.3 oz)    Examination:  General exam: Pleasant elderly male sitting up comfortably in chair this morning. Respiratory system: Diminished breath sounds in the left base but otherwise clear to auscultation without wheezing or rhonchi or crackles. No increased work of breathing. Cardiovascular system: S1 & S2 heard, RRR. No JVD, murmurs, rubs, gallops or clicks. No pedal edema. Telemetry: Sinus rhythm. Gastrointestinal system: Abdomen is nondistended, soft and nontender. No organomegaly or masses felt. Normal bowel sounds heard. Central nervous system: Alert and oriented. No focal neurological deficits. Extremities: Symmetric 5 x 5 power. Skin: No rashes, lesions or ulcers Psychiatry: Judgement and insight appear normal. Mood & affect appropriate.     Data Reviewed: I have personally reviewed following labs and imaging studies  CBC:  Recent Labs Lab 08/11/16 1147 08/11/16 1811 08/12/16 0637 08/14/16 0340  WBC 12.6* 14.4* 16.3* 18.5*  NEUTROABS 10.6*  --   --   --   HGB 13.8 13.1 11.3* 12.5*  HCT 41.9 40.7 35.0* 39.3  MCV 92.1 92.9 92.3 94.5  PLT 201 199  176 216   Basic Metabolic Panel:  Recent Labs Lab 08/11/16 1147 08/11/16 1811 08/11/16 2000 08/11/16 2019 08/12/16 0339 08/13/16 0326 08/16/16 0346  NA 139  --  138  --  138 138 135  K 4.6  --  3.2*  --  4.2 4.9 4.3  CL 96*  --  100*  --  103 105 84*  CO2 33*  --  25  --  28 28 45*  GLUCOSE 203*  --  348*  --  196* 171* 192*  BUN 17  --  19  --  17 20 35*  CREATININE 0.93 1.28* 1.25*  --  0.89 0.74 0.84  CALCIUM 9.8  --  8.1*  --  8.1* 8.2* 9.1  MG  --   --   --  1.1* 3.2* 2.2  --   PHOS   --   --   --  2.8 3.5 3.3  --    GFR: Estimated Creatinine Clearance: 63 mL/min (by C-G formula based on SCr of 0.84 mg/dL). Liver Function Tests:  Recent Labs Lab 08/11/16 1147 08/12/16 0339  AST 25 35  ALT 16* 14*  ALKPHOS 96 69  BILITOT 1.2 0.7  PROT 7.5 6.0*  ALBUMIN 4.7 3.6   No results for input(s): LIPASE, AMYLASE in the last 168 hours. No results for input(s): AMMONIA in the last 168 hours. Coagulation Profile:  Recent Labs Lab 08/12/16 0339  INR 1.20   Cardiac Enzymes:  Recent Labs Lab 08/11/16 2000 08/12/16 0339 08/12/16 1247  TROPONINI 0.45* 1.56* 1.11*   BNP (last 3 results) No results for input(s): PROBNP in the last 8760 hours. HbA1C:  Recent Labs  08/14/16 0340  HGBA1C 7.7*   CBG:  Recent Labs Lab 08/15/16 0755 08/15/16 1302 08/15/16 1626 08/15/16 2113 08/16/16 0814  GLUCAP 215* 281* 234* 115* 189*   Lipid Profile: No results for input(s): CHOL, HDL, LDLCALC, TRIG, CHOLHDL, LDLDIRECT in the last 72 hours. Thyroid Function Tests: No results for input(s): TSH, T4TOTAL, FREET4, T3FREE, THYROIDAB in the last 72 hours. Anemia Panel: No results for input(s): VITAMINB12, FOLATE, FERRITIN, TIBC, IRON, RETICCTPCT in the last 72 hours.  Sepsis Labs:  Recent Labs Lab 08/11/16 1147  08/11/16 1811 08/11/16 2000 08/11/16 2249 08/12/16 0339 08/12/16 0637 08/12/16 1121 08/14/16 0340  PROCALCITON  --   --   --  0.30  --  0.43  --   --   --   WBC 12.6*  --  14.4*  --   --   --  16.3*  --  18.5*  LATICACIDVEN  --   < > 8.6* 6.6* 3.2*  --   --  0.7  --   < > = values in this interval not displayed.  Recent Results (from the past 240 hour(s))  MRSA PCR Screening     Status: None   Collection Time: 08/11/16  6:07 PM  Result Value Ref Range Status   MRSA by PCR NEGATIVE NEGATIVE Final    Comment:        The GeneXpert MRSA Assay (FDA approved for NASAL specimens only), is one component of a comprehensive MRSA colonization surveillance  program. It is not intended to diagnose MRSA infection nor to guide or monitor treatment for MRSA infections.          Radiology Studies: No results found.      Scheduled Meds: . aspirin EC  81 mg Oral QHS  . atenolol  25 mg Oral QHS  . atorvastatin  40 mg Oral QHS  . clopidogrel  75 mg Oral QHS  . dutasteride  0.5 mg Oral QHS  . enoxaparin (LOVENOX) injection  40 mg Subcutaneous Q24H  . hydrochlorothiazide  25 mg Oral QHS  . insulin aspart  0-15 Units Subcutaneous TID WC  . insulin aspart  0-5 Units Subcutaneous QHS  . insulin glargine  10 Units Subcutaneous Daily  . ipratropium-albuterol  3 mL Nebulization QID  . methylPREDNISolone (SOLU-MEDROL) injection  60 mg Intravenous Q12H  . ramipril  10 mg Oral QHS  . sodium chloride flush  3 mL Intravenous Q12H  . sodium chloride flush  3 mL Intravenous Q12H  . tamsulosin  0.4 mg Oral QHS   Continuous Infusions:   LOS: 5 days        [facility], MD [facility] Pager [phone]  If 7PM-7AM, please contact night-coverage [email or web address] Password [email or web address] 08/16/2016, 11:04 AM

## 2016-08-16 NOTE — Progress Notes (Signed)
Pt currently off BIPAP and tolerating well at this time. RT to monitor and assess as needed.  

## 2016-08-16 NOTE — Progress Notes (Signed)
Pt requesting to come off BIPAP at this time.  RT to monitor and assess as needed.

## 2016-08-16 NOTE — Consult Note (Signed)
Name: Angel [name] MRN: [id number] DOB: [birth year]    ADMISSION DATE:  08/11/2016 CONSULTATION DATE:  1/26  REFERRING MD :  Angel [name]   CHIEF COMPLAINT:  Hypoxia   BRIEF PATIENT DESCRIPTION:  81 year old male w/ known h/o paralyzed left HD. Admitted 1/22 w/ influenza A and acute hypoxic respiratory failure. Treated w/ tamiflu and intermittent BIPAP. PCCM asked to see 1/26 as still needing higher levels of oxygen.   SIGNIFICANT EVENTS  1/26 Diuresed, oxygenation improved  STUDIES:   ECHO 1/23 - Normal LV systolic function; grade 2 diastolic dysfunction;   calcified aortic valve with mild AS; biatrial enlargement;   moderately elevated pulmonary pressure. - Left atrium: The atrium was moderately dilated. - Right ventricle: Systolic function was mildly reduced. - Right atrium: The atrium was mildly dilated. - Pulmonary arteries: Systolic pressure was moderately increased. CT chest 1/22: marked volume loss on left. No edema or infiltrate    SUBJECTIVE:  Feels much better Diuresed well Has been up in chair Breathing improved Minimal cough  VITAL SIGNS: Temp:  [97 F (36.1 C)-98.4 F (36.9 C)] 98 F (36.7 C) (01/27 1200) Pulse Rate:  [67-75] 67 (01/27 1200) Resp:  [15-31] 24 (01/27 1200) BP: (113-150)/(52-79) 113/55 (01/27 1200) SpO2:  [91 %-100 %] 100 % (01/27 1200) FiO2 (%):  [50 %] 50 % (01/27 0027) Weight:  [159 lb 13.3 oz (72.5 kg)] 159 lb 13.3 oz (72.5 kg) (01/27 0415)  Intake/Output Summary (Last 24 hours) at 08/16/16 1438 Last data filed at 08/16/16 0600  Gross per 24 hour  Intake                0 ml  Output             1900 ml  Net            -1900 ml   PHYSICAL EXAMINATION: General appearance:  Sitting up in bed, comfortable HENT: NCAT OP clear PULM: few crackles bases, some wheezing, normal effort CV: RRR, no mgr GI: BS+, soft, nontender MSK: normal bulk and tone Neuro: awake, alert, no distress   Recent Labs Lab 08/12/16 0339 08/13/16 0326  08/16/16 0346  NA 138 138 135  K 4.2 4.9 4.3  CL 103 105 84*  CO2 28 28 45*  BUN 17 20 35*  CREATININE 0.89 0.74 0.84  GLUCOSE 196* 171* 192*    Recent Labs Lab 08/11/16 1811 08/12/16 0637 08/14/16 0340  HGB 13.1 11.3* 12.5*  HCT 40.7 35.0* 39.3  WBC 14.4* 16.3* 18.5*  PLT 199 176 216   No results found.  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  He should continue to improve but it would not be unusual if he needs oxygen post discharge given his left hemidiaphragm paralysis.  His pulmonary hypertension is likely explained by his acute pulmonary edema.  Plan Will decrease solumedrol frequency to daily Continue bronchodilators as prescribed  Will give lasix again today and check BMET, Mg post Will repeat CXR 2 view in AM Continue to wean O2 to maintain O2 saturation > 90% Will need outpatient Echo  Consider transfer to Med-surg today or tomorrow  Angel [place],  MD [facility] Pager: [phone] Cell: [phone] After 3pm or if no response, call [phone]   08/16/2016, 2:38 PM

## 2016-08-17 ENCOUNTER — Inpatient Hospital Stay (HOSPITAL_COMMUNITY): Payer: Commercial Managed Care - HMO

## 2016-08-17 DIAGNOSIS — R071 Chest pain on breathing: Secondary | ICD-10-CM

## 2016-08-17 DIAGNOSIS — J9621 Acute and chronic respiratory failure with hypoxia: Secondary | ICD-10-CM

## 2016-08-17 DIAGNOSIS — R338 Other retention of urine: Secondary | ICD-10-CM

## 2016-08-17 LAB — BASIC METABOLIC PANEL
Anion gap: 12 (ref 5–15)
BUN: 42 mg/dL — ABNORMAL HIGH (ref 6–20)
CO2: 44 mmol/L — ABNORMAL HIGH (ref 22–32)
Calcium: 9.1 mg/dL (ref 8.9–10.3)
Chloride: 78 mmol/L — ABNORMAL LOW (ref 101–111)
Creatinine, Ser: 0.92 mg/dL (ref 0.61–1.24)
GFR calc Af Amer: >60 mL/min (ref >60)
GFR calc non Af Amer: >60 mL/min (ref >60)
Glucose, Bld: 132 mg/dL — ABNORMAL HIGH (ref 65–99)
Potassium: 3.6 mmol/L (ref 3.5–5.1)
Sodium: 134 mmol/L — ABNORMAL LOW (ref 135–145)

## 2016-08-17 LAB — TROPONIN I: Troponin I: 0.21 ng/mL (ref <0.03)

## 2016-08-17 LAB — GLUCOSE, CAPILLARY
Glucose-Capillary: 141 mg/dL — ABNORMAL HIGH (ref 65–99)
Glucose-Capillary: 202 mg/dL — ABNORMAL HIGH (ref 65–99)
Glucose-Capillary: 70 mg/dL (ref 65–99)
Glucose-Capillary: 134 mg/dL — ABNORMAL HIGH (ref 65–99)
Glucose-Capillary: 135 mg/dL — ABNORMAL HIGH (ref 65–99)

## 2016-08-17 LAB — CBC
HCT: 41.6 % (ref 39.0–52.0)
Hemoglobin: 13.7 g/dL (ref 13.0–17.0)
MCH: 29.8 pg (ref 26.0–34.0)
MCHC: 32.9 g/dL (ref 30.0–36.0)
MCV: 90.4 fL (ref 78.0–100.0)
Platelets: 253 10*3/uL (ref 150–400)
RBC: 4.6 MIL/uL (ref 4.22–5.81)
RDW: 13.2 % (ref 11.5–15.5)
WBC: 20.8 10*3/uL — ABNORMAL HIGH (ref 4.0–10.5)

## 2016-08-17 MED ORDER — MORPHINE SULFATE (PF) 2 MG/ML IV SOLN
2.0000 mg | Freq: Once | INTRAVENOUS | Status: AC
  Administered 2016-08-17: 2 mg via INTRAVENOUS

## 2016-08-17 MED ORDER — NITROGLYCERIN 2 % TD OINT
0.5000 [in_us] | TOPICAL_OINTMENT | Freq: Once | TRANSDERMAL | Status: AC
  Administered 2016-08-17: 0.5 [in_us] via TOPICAL
  Filled 2016-08-17: qty 30

## 2016-08-17 MED ORDER — GUAIFENESIN ER 600 MG PO TB12
1200.0000 mg | ORAL_TABLET | Freq: Two times a day (BID) | ORAL | Status: DC
  Administered 2016-08-17 – 2016-08-20 (×6): 1200 mg via ORAL
  Filled 2016-08-17 (×8): qty 2

## 2016-08-17 MED ORDER — MORPHINE SULFATE (PF) 2 MG/ML IV SOLN
INTRAVENOUS | Status: AC
  Filled 2016-08-17: qty 1

## 2016-08-17 NOTE — Progress Notes (Signed)
PROGRESS NOTE  Angel [name]  ZOX:[id number] DOB: [birth year]  DOA: 08/11/2016 PCP: Jackie Plum, MD   Brief Narrative:  81 year old male with PMH of CAD status post LAD stenting, DM 2, HLD, HTN, BPH, paralyzed left diaphragm,? Asthma, lifelong nonsmoker, presented to ED on 08/11/16 with 3 days history of progressively worsening dyspnea, dry cough and wheezing. He was admitted to stepdown unit for acute hypoxic respiratory failure secondary to influenza related respiratory illness and possible asthma exacerbation. Despite aggressive treatment, continued to be hypoxic requiring high-volume oxygen and intermittent BiPAP. Pulmonology consulted on 1/26 and suspect some of this is related to chronically paralyzed left hemidiaphragm and decompensated diastolic CHF. Improved. Transfer to telemetry 08/17/16. DC home possibly in the next 1-2 days.   Assessment & Plan:   Principal Problem:   Respiratory failure (HCC) Active Problems:   CAD (coronary artery disease)   Diabetes mellitus (HCC)   HTN (hypertension)   Hyperlipidemia   COPD exacerbation (HCC)   Influenza   Hypertensive heart disease without heart failure   Demand ischemia (HCC)   S/P coronary artery stent placement   1. Influenza A with acute viral bronchitis &? Asthma exacerbation: No formal diagnosis of asthma or COPD. Influenza A PCR positive and B PCR negative. Completed 5 day course of Tamiflu. Supportive treatment. No clinical bronchospasm > steroids discontinued 1/28. 2. Acute respiratory failure with hypoxia: Has required high-volume oxygen and intermittent BiPAP since admission. Initially felt to be related to acute viral bronchitis from influenza and possible asthma exacerbation and treated likewise. Despite this treatment, continued to be hypoxic and hence pulmonology was consulted on 1/26. As per pulmonology, acute hypoxic respiratory failure is multifactorial related to influenza A complicated by acute diastolic CHF,  decompensated cor pulmonale with elevated PAP's in the setting of severe restrictive lung disease due to left hemidiaphragm paralysis and they doubt PE. Treated with PRN Lasix IV, continued bronchodilator nebulizations, when necessary BiPAP, reducing IV Solu-Medrol. Repeat chest x-ray 1/24 without acute findings. -2.8 L yesterday. Needed BiPAP overnight and currently on oxygen 8 L/m. Mobilize. Incentive spirometry. Mx per pulmonology. Discussed with pulmonology in detail: Discontinued BiPAP, flutter valve, chest PT, discontinued Solu-Medrol, okay to transfer to floor, wean oxygen as tolerated but likely may need home oxygen at discharge. 3. Severe sepsis secondary to influenza: Sepsis physiology improved. Received a dose of vancomycin and Zosyn in the ED which were not continued due to lack of definitive source of bacterial infection. 4. Elevated troponin/history of CAD status post LAD stenting in 2004: Secondary to demand ischemia from acute illness. EKG without acute changes. Troponin has decreased. Briefly on IV heparin drip which was discontinued. No chest pain reported. Echo shows normal LV function without wall motion abnormalities. Cardiology consultation and follow-up appreciated: Recommending outpatient stress test & follow-up with Dr. Elease Hashimoto. Continue aspirin, Plavix, atenolol and statins. 5. Lactic acidosis: Secondary to acute illness. Resolved. 6. Essential hypertension: controlled. Resumed home ACEI and HCTZ. Continue atenolol. Added when necessary IV hydralazine. 7. DM 2: Mildly uncontrolled CBGs in the context of steroids. Continue NovoLog SSI, moderate sensitivity. Increased Lantus to 10 units daily. Holding oral hypoglycemics. CBGs are better. A1c: 7.7. 8. BPH: Continue Avodart and Fosamax. 9. Hypomagnesemia: Replaced 10. Acute urinary retention: Intermittent difficulty urinating which initially required in and out cath as needed but eventually placed Foley catheter on 1/26. Urine microscopy  not suggestive of UTI. DC Foley catheter 1/28 and voiding trials. 11. Anemia: Stable. 12. Chronic left hemidiaphragm paralysis: Management as above. Mobilize and aggressive  pulmonary toilet. 13. Leukocytosis: Secondary to steroids.   DVT prophylaxis: Lovenox. Was on heparin drip until 1/3. Code Status: Full Family Communication: None at bedside. Discussed with patient's daughter in detail on 1/28 and updated her regarding his care.  Disposition Plan: Admitted to stepdown. Transfer to telemetry on 1/28. Possible discharge home in 1-2 days.  Consultants:   Cardiology-signed off.  Pulmonology  Procedures:   2-D echo 08/12/16: Study Conclusions  - Left ventricle: The cavity size was normal. Wall thickness was   increased in a pattern of mild LVH. Systolic function was normal.   The estimated ejection fraction was in the range of 55% to 60%.   Wall motion was normal; there were no regional wall motion   abnormalities. Features are consistent with a pseudonormal left   ventricular filling pattern, with concomitant abnormal relaxation   and increased filling pressure (grade 2 diastolic dysfunction). - Aortic valve: Valve mobility was restricted. There was mild   stenosis. - Mitral valve: Calcified annulus. - Left atrium: The atrium was moderately dilated. - Right ventricle: Systolic function was mildly reduced. - Right atrium: The atrium was mildly dilated. - Pulmonary arteries: Systolic pressure was moderately increased.  Impressions:  - Normal LV systolic function; grade 2 diastolic dysfunction;   calcified aortic valve with mild AS; biatrial enlargement;   moderately elevated pulmonary pressure.   BiPAP when necessary  Foley 1/26>  Antimicrobials:   Tamiflu 1/21 >  Zosyn and vancomycin 1 dose in ED    Subjective: As per RN, was on BiPAP overnight and transitioned to nasal cannula oxygen 8 L. Patient states that the BiPAP mask was leaking. He is insistent on going  home. Denies dyspnea. Advised him that he is not yet medically stable for discharge home due to difficult hypoxia requiring high flow oxygen. I personally discussed with his daughter at length and advised her to speak to him and convinced him in staying for another day or 2 optimize his medical care prior to discharge.  Objective:  Vitals:   08/17/16 0600 08/17/16 0700 08/17/16 0800 08/17/16 0901  BP: 112/70  (!) 125/50   Pulse:  68 74   Resp:  17 (!) 23   Temp:   97.6 F (36.4 C)   TempSrc:   Oral   SpO2: 98% 97% 91% 95%  Weight:      Height:        Intake/Output Summary (Last 24 hours) at 08/17/16 1033 Last data filed at 08/17/16 0600  Gross per 24 hour  Intake                0 ml  Output             2850 ml  Net            -2850 ml   Filed Weights   08/15/16 0500 08/16/16 0415 08/17/16 0530  Weight: 77.2 kg (170 lb 3.1 oz) 72.5 kg (159 lb 13.3 oz) 69.7 kg (153 lb 10.6 oz)    Examination:  General exam: Pleasant elderly male sitting up comfortably in chair this morning. Respiratory system: Diminished breath sounds in the left base but otherwise clear to auscultation without wheezing or rhonchi or crackles. No increased work of breathing. Cardiovascular system: S1 & S2 heard, RRR. No JVD, murmurs, rubs, gallops or clicks. No pedal edema. Telemetry: Sinus rhythm. Gastrointestinal system: Abdomen is nondistended, soft and nontender. No organomegaly or masses felt. Normal bowel sounds heard. Central nervous system: Alert and oriented. No  focal neurological deficits. Extremities: Symmetric 5 x 5 power. Skin: No rashes, lesions or ulcers Psychiatry: Judgement and insight appear normal. Mood & affect appropriate.     Data Reviewed: I have personally reviewed following labs and imaging studies  CBC:  Recent Labs Lab 08/11/16 1147 08/11/16 1811 08/12/16 0637 08/14/16 0340 08/17/16 0345  WBC 12.6* 14.4* 16.3* 18.5* 20.8*  NEUTROABS 10.6*  --   --   --   --   HGB 13.8  13.1 11.3* 12.5* 13.7  HCT 41.9 40.7 35.0* 39.3 41.6  MCV 92.1 92.9 92.3 94.5 90.4  PLT 201 199 176 216 253   Basic Metabolic Panel:  Recent Labs Lab 08/11/16 2000 08/11/16 2019 08/12/16 0339 08/13/16 0326 08/16/16 0346 08/17/16 0345  NA 138  --  138 138 135 134*  K 3.2*  --  4.2 4.9 4.3 3.6  CL 100*  --  103 105 84* 78*  CO2 25  --  28 28 45* 44*  GLUCOSE 348*  --  196* 171* 192* 132*  BUN 19  --  17 20 35* 42*  CREATININE 1.25*  --  0.89 0.74 0.84 0.92  CALCIUM 8.1*  --  8.1* 8.2* 9.1 9.1  MG  --  1.1* 3.2* 2.2  --   --   PHOS  --  2.8 3.5 3.3  --   --    GFR: Estimated Creatinine Clearance: 57.6 mL/min (by C-G formula based on SCr of 0.92 mg/dL). Liver Function Tests:  Recent Labs Lab 08/11/16 1147 08/12/16 0339  AST 25 35  ALT 16* 14*  ALKPHOS 96 69  BILITOT 1.2 0.7  PROT 7.5 6.0*  ALBUMIN 4.7 3.6   No results for input(s): LIPASE, AMYLASE in the last 168 hours. No results for input(s): AMMONIA in the last 168 hours. Coagulation Profile:  Recent Labs Lab 08/12/16 0339  INR 1.20   Cardiac Enzymes:  Recent Labs Lab 08/11/16 2000 08/12/16 0339 08/12/16 1247  TROPONINI 0.45* 1.56* 1.11*   BNP (last 3 results) No results for input(s): PROBNP in the last 8760 hours. HbA1C: No results for input(s): HGBA1C in the last 72 hours. CBG:  Recent Labs Lab 08/16/16 0814 08/16/16 1234 08/16/16 1649 08/16/16 2125 08/17/16 0808  GLUCAP 189* 275* 244* 181* 141*   Lipid Profile: No results for input(s): CHOL, HDL, LDLCALC, TRIG, CHOLHDL, LDLDIRECT in the last 72 hours. Thyroid Function Tests: No results for input(s): TSH, T4TOTAL, FREET4, T3FREE, THYROIDAB in the last 72 hours. Anemia Panel: No results for input(s): VITAMINB12, FOLATE, FERRITIN, TIBC, IRON, RETICCTPCT in the last 72 hours.  Sepsis Labs:  Recent Labs Lab 08/11/16 1811 08/11/16 2000 08/11/16 2249 08/12/16 0339 08/12/16 1610 08/12/16 1121 08/14/16 0340 08/17/16 0345    PROCALCITON  --  0.30  --  0.43  --   --   --   --   WBC 14.4*  --   --   --  16.3*  --  18.5* 20.8*  LATICACIDVEN 8.6* 6.6* 3.2*  --   --  0.7  --   --     Recent Results (from the past 240 hour(s))  MRSA PCR Screening     Status: None   Collection Time: 08/11/16  6:07 PM  Result Value Ref Range Status   MRSA by PCR NEGATIVE NEGATIVE Final    Comment:        The GeneXpert MRSA Assay (FDA approved for NASAL specimens only), is one component of a comprehensive MRSA colonization surveillance program. It  is not intended to diagnose MRSA infection nor to guide or monitor treatment for MRSA infections.          Radiology Studies: Dg Chest Port 1 View  Result Date: 08/17/2016 CLINICAL DATA:  Acute on chronic respiratory failure EXAM: PORTABLE CHEST 1 VIEW COMPARISON:  08/13/2016 FINDINGS: Elevated left hemidiaphragm. Cardiomegaly. Mild vascular congestion. Probable atelectasis at the right base. No visible effusions or acute bony abnormality. IMPRESSION: No significant change. Electronically Signed   By: Charlett NoseKevin  Dover M.D.   On: 08/17/2016 09:05        Scheduled Meds: . aspirin EC  81 mg Oral QHS  . atenolol  25 mg Oral QHS  . atorvastatin  40 mg Oral QHS  . clopidogrel  75 mg Oral QHS  . dutasteride  0.5 mg Oral QHS  . enoxaparin (LOVENOX) injection  40 mg Subcutaneous Q24H  . guaiFENesin  1,200 mg Oral BID  . hydrochlorothiazide  25 mg Oral QHS  . insulin aspart  0-15 Units Subcutaneous TID WC  . insulin aspart  0-5 Units Subcutaneous QHS  . insulin glargine  10 Units Subcutaneous Daily  . ipratropium-albuterol  3 mL Nebulization QID  . ramipril  10 mg Oral QHS  . sodium chloride flush  3 mL Intravenous Q12H  . sodium chloride flush  3 mL Intravenous Q12H  . tamsulosin  0.4 mg Oral QHS   Continuous Infusions:   LOS: 6 days        [facility] Dba Matrix Surgery CenterNGALGI,Sharnae Winfree, MD [facility] Pager [phone]  If 7PM-7AM, please contact night-coverage [email or web address] Password  [email or web address] 08/17/2016, 10:33 AM

## 2016-08-17 NOTE — Progress Notes (Signed)
Name: Angel [name] MRN: [id number] DOB: 10/28/1935    ADMISSION DATE:  08/11/2016 CONSULTATION DATE:  1/26  REFERRING MD :  Waymon Amato   CHIEF COMPLAINT:  Hypoxia   BRIEF PATIENT DESCRIPTION:  81 year old male w/ known h/o paralyzed left HD. Admitted 1/22 w/ influenza A and acute hypoxic respiratory failure. Treated w/ tamiflu and intermittent BIPAP. PCCM asked to see 1/26 as still needing higher levels of oxygen.   SIGNIFICANT EVENTS  1/26 Diuresed, oxygenation stable   STUDIES:   ECHO 1/23 - Normal LV systolic function; grade 2 diastolic dysfunction;   calcified aortic valve with mild AS; biatrial enlargement;   moderately elevated pulmonary pressure. - Left atrium: The atrium was moderately dilated. - Right ventricle: Systolic function was mildly reduced. - Right atrium: The atrium was mildly dilated. - Pulmonary arteries: Systolic pressure was moderately increased. CT chest 1/22: marked volume loss on left. No edema or infiltrate    SUBJECTIVE:  On BIPAP overnight Remains on high flow oxygen this morning Wants to get out of bed  VITAL SIGNS: Temp:  [96.7 F (35.9 C)-98.1 F (36.7 C)] 96.7 F (35.9 C) (01/28 0305) Pulse Rate:  [63-79] 74 (01/28 0800) Resp:  [13-31] 23 (01/28 0800) BP: (112-128)/(41-95) 125/50 (01/28 0800) SpO2:  [91 %-100 %] 91 % (01/28 0800) FiO2 (%):  [50 %] 50 % (01/28 0600) Weight:  [153 lb 10.6 oz (69.7 kg)] 153 lb 10.6 oz (69.7 kg) (01/28 0530)  Intake/Output Summary (Last 24 hours) at 08/17/16 0817 Last data filed at 08/17/16 0600  Gross per 24 hour  Intake                0 ml  Output             2850 ml  Net            -2850 ml   PHYSICAL EXAMINATION: Gen Sitting up in chair, no distress HENT: NCAT OP clear PULM: lungs clear, no wheezing, no crackles CV: RRR, no mgr GI: BS+, soft, nontender MSK: normal bulk and tone Neuro: awake, alert, no distress   Recent Labs Lab 08/13/16 0326 08/16/16 0346 08/17/16 0345  NA 138 135  134*  K 4.9 4.3 3.6  CL 105 84* 78*  CO2 28 45* 44*  BUN 20 35* 42*  CREATININE 0.74 0.84 0.92  GLUCOSE 171* 192* 132*    Recent Labs Lab 08/12/16 0637 08/14/16 0340 08/17/16 0345  HGB 11.3* 12.5* 13.7  HCT 35.0* 39.3 41.6  WBC 16.3* 18.5* 20.8*  PLT 176 216 253   No results found.  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 looks and feels better after diuresis and his lungs are now perfectly clear but he remains profoundly hypoxemic.  If his CXR today is clear we will need to get a CT angiogram of his chest to rule out PE. Often patients with hemidiaphragm paralysis can become profoundly hypoxemic while ill due to shunt, so perhaps that is the best explanation for his condition this morning.  Plan Stop solumedrol Out of bed, walk with PT (use high flow O2/NRB) Monitor O2 saturation on forehead probe, if still hypoxemic  then check ABG Continue bronchodilators as prescribed  Hold further lasix CXR now, if no pulmonary edema or infiltrate then CT angiogram to rule out PE Continue to wean O2 to maintain O2 saturation > 90% Will need outpatient Echo repeat Will need outpatient pulmonary follow up    Heber CarolinaBrent Amoni Scallan, MD [facility] Pager: [phone] Cell: [phone] After 3pm or if no response, call [phone]   08/17/2016, 8:17 AM

## 2016-08-17 NOTE — Progress Notes (Signed)
PT demonstrated hands on understanding of Flutter device. 

## 2016-08-17 NOTE — Progress Notes (Signed)
Added Sterile water to HFNC system.

## 2016-08-17 NOTE — Progress Notes (Signed)
[facility]  I was called emergently to the bedside to evaluate Mr. Angel [name] for chest pain and shortness of breath. Apparently his symptoms started when oxygen was turned from 8 L/m to 6 L/m. He currently has substernal chest pain and some shortness of breath.  On exam: Lungs are clear to auscultation, no wheezing, no crackles, there are symmetric bilaterally Cardiovascular regular rate and rhythm no murmurs gallops or rubs, no JVD Belly soft nontender  Impression: Chest pain Persistent hypoxemia  Plan: Morphine IV now Nitropaste now Twelve-lead EKG now Troponin now Titrate O2 to maintain O2 saturation greater than 88%  Maintain an ICU  Critical care time 30 minutes  Heber CarolinaBrent McQuaid, MD [facility] Pager: [phone] Cell: [phone] After 3pm or if no response, call [phone]

## 2016-08-17 NOTE — Progress Notes (Signed)
[facility]  CXR images reviewed, new R base atelectasis, ? Mucus plugging.  No need for CT angiogram. Start flutter valve, mucinex, chest PT  Heber CarolinaBrent Pal Shell, MD [place] Pager: [phone] Cell: [phone] After 3pm or if no response, call [phone]

## 2016-08-18 DIAGNOSIS — J1100 Influenza due to unidentified influenza virus with unspecified type of pneumonia: Secondary | ICD-10-CM

## 2016-08-18 DIAGNOSIS — J986 Disorders of diaphragm: Secondary | ICD-10-CM

## 2016-08-18 DIAGNOSIS — G9340 Encephalopathy, unspecified: Secondary | ICD-10-CM

## 2016-08-18 LAB — COMPREHENSIVE METABOLIC PANEL
ALT: 21 U/L (ref 17–63)
AST: 22 U/L (ref 15–41)
Albumin: 3.6 g/dL (ref 3.5–5.0)
Alkaline Phosphatase: 77 U/L (ref 38–126)
Anion gap: 9 (ref 5–15)
BUN: 43 mg/dL — ABNORMAL HIGH (ref 6–20)
CO2: 46 mmol/L — ABNORMAL HIGH (ref 22–32)
Calcium: 8.8 mg/dL — ABNORMAL LOW (ref 8.9–10.3)
Chloride: 78 mmol/L — ABNORMAL LOW (ref 101–111)
Creatinine, Ser: 0.79 mg/dL (ref 0.61–1.24)
GFR calc Af Amer: >60 mL/min (ref >60)
GFR calc non Af Amer: >60 mL/min (ref >60)
Glucose, Bld: 112 mg/dL — ABNORMAL HIGH (ref 65–99)
Potassium: 3 mmol/L — ABNORMAL LOW (ref 3.5–5.1)
Sodium: 133 mmol/L — ABNORMAL LOW (ref 135–145)
Total Bilirubin: 1.2 mg/dL (ref 0.3–1.2)
Total Protein: 6.1 g/dL — ABNORMAL LOW (ref 6.5–8.1)

## 2016-08-18 LAB — BASIC METABOLIC PANEL
Anion gap: 10 (ref 5–15)
BUN: 42 mg/dL — ABNORMAL HIGH (ref 6–20)
CO2: 45 mmol/L — ABNORMAL HIGH (ref 22–32)
Calcium: 8.7 mg/dL — ABNORMAL LOW (ref 8.9–10.3)
Chloride: 78 mmol/L — ABNORMAL LOW (ref 101–111)
Creatinine, Ser: 0.74 mg/dL (ref 0.61–1.24)
GFR calc Af Amer: >60 mL/min (ref >60)
GFR calc non Af Amer: >60 mL/min (ref >60)
Glucose, Bld: 102 mg/dL — ABNORMAL HIGH (ref 65–99)
Potassium: 3 mmol/L — ABNORMAL LOW (ref 3.5–5.1)
Sodium: 133 mmol/L — ABNORMAL LOW (ref 135–145)

## 2016-08-18 LAB — CBC
HCT: 39.3 % (ref 39.0–52.0)
HCT: 39.3 % (ref 39.0–52.0)
Hemoglobin: 12.8 g/dL — ABNORMAL LOW (ref 13.0–17.0)
Hemoglobin: 12.8 g/dL — ABNORMAL LOW (ref 13.0–17.0)
MCH: 29.1 pg (ref 26.0–34.0)
MCH: 29.4 pg (ref 26.0–34.0)
MCHC: 32.6 g/dL (ref 30.0–36.0)
MCHC: 32.6 g/dL (ref 30.0–36.0)
MCV: 89.3 fL (ref 78.0–100.0)
MCV: 90.3 fL (ref 78.0–100.0)
Platelets: 202 10*3/uL (ref 150–400)
Platelets: 232 10*3/uL (ref 150–400)
RBC: 4.35 MIL/uL (ref 4.22–5.81)
RBC: 4.4 MIL/uL (ref 4.22–5.81)
RDW: 13 % (ref 11.5–15.5)
RDW: 13.2 % (ref 11.5–15.5)
WBC: 12.3 10*3/uL — ABNORMAL HIGH (ref 4.0–10.5)
WBC: 15.5 10*3/uL — ABNORMAL HIGH (ref 4.0–10.5)

## 2016-08-18 LAB — GLUCOSE, CAPILLARY
Glucose-Capillary: 125 mg/dL — ABNORMAL HIGH (ref 65–99)
Glucose-Capillary: 189 mg/dL — ABNORMAL HIGH (ref 65–99)
Glucose-Capillary: 241 mg/dL — ABNORMAL HIGH (ref 65–99)

## 2016-08-18 LAB — MAGNESIUM: Magnesium: 1.8 mg/dL (ref 1.7–2.4)

## 2016-08-18 MED ORDER — HALOPERIDOL LACTATE 5 MG/ML IJ SOLN
1.0000 mg | Freq: Once | INTRAMUSCULAR | Status: AC
  Administered 2016-08-18: 1 mg via INTRAVENOUS
  Filled 2016-08-18: qty 1

## 2016-08-18 MED ORDER — ACETAMINOPHEN 325 MG PO TABS: 650.0000 mg | ORAL_TABLET | Freq: Four times a day (QID) | ORAL | Status: DC | PRN

## 2016-08-18 MED ORDER — POTASSIUM CHLORIDE CRYS ER 20 MEQ PO TBCR
40.0000 meq | EXTENDED_RELEASE_TABLET | ORAL | Status: AC
  Administered 2016-08-18 (×2): 40 meq via ORAL
  Filled 2016-08-18 (×2): qty 2

## 2016-08-18 MED ORDER — ALBUTEROL SULFATE (2.5 MG/3ML) 0.083% IN NEBU
2.5000 mg | INHALATION_SOLUTION | RESPIRATORY_TRACT | Status: DC | PRN
  Administered 2016-08-19 – 2016-08-20 (×3): 2.5 mg via RESPIRATORY_TRACT
  Filled 2016-08-18 (×3): qty 3

## 2016-08-18 NOTE — Progress Notes (Signed)
PROGRESS NOTE  Angel [name]  ZOX:[id number] DOB: [birth year]  DOA: 08/11/2016 PCP: Jackie Plum, MD   Brief Narrative:  81 year old male with PMH of CAD status post LAD stenting, DM 2, HLD, HTN, BPH, paralyzed left diaphragm,? Asthma, lifelong nonsmoker, presented to ED on 08/11/16 with 3 days history of progressively worsening dyspnea, dry cough and wheezing. He was admitted to stepdown unit for acute hypoxic respiratory failure secondary to influenza related respiratory illness and possible asthma exacerbation. Despite aggressive treatment, continued to be hypoxic requiring high-volume oxygen and intermittent BiPAP. Pulmonology consulted on 1/26 and suspect some of this is related to chronically paralyzed left hemidiaphragm and decompensated diastolic CHF. Improved. Telemetry transfer was planned on 1/28 but he reported worsening chest pain and dyspnea. Hypoxia has improved. Chest pain is resolved. Cardiology reconsulting. CCM has seen and cleared for transfer to medical bed. Transferring to telemetry 1/29. New onset acute confusion and ongoing urinary retention-Foley catheter reinserted.   Assessment & Plan:   Principal Problem:   Respiratory failure (HCC) Active Problems:   CAD (coronary artery disease)   Diabetes mellitus (HCC)   HTN (hypertension)   Hyperlipidemia   COPD exacerbation (HCC)   Influenza   Hypertensive heart disease without heart failure   Demand ischemia (HCC)   S/P coronary artery stent placement   Acute urinary retention   Chest pain on breathing   Acute on chronic respiratory failure with hypoxemia (HCC)   Diaphragm paralysis   1. Influenza A with acute viral bronchitis &? Asthma exacerbation: No formal diagnosis of asthma or COPD. Influenza A PCR positive and B PCR negative. Completed 5 day course of Tamiflu. Supportive treatment. No clinical bronchospasm > steroids discontinued 1/28. 2. Acute respiratory failure with hypoxia: Has required high-volume oxygen  and intermittent BiPAP since admission. Initially felt to be related to acute viral bronchitis from influenza and possible asthma exacerbation and treated likewise. Despite this treatment, continued to be hypoxic and hence pulmonology was consulted on 1/26. As per pulmonology, acute hypoxic respiratory failure is multifactorial related to influenza A complicated by acute diastolic CHF, decompensated cor pulmonale with elevated PAP's in the setting of severe restrictive lung disease due to left hemidiaphragm paralysis and they doubt PE. Treated with PRN Lasix IV, continued bronchodilator nebulizations, when necessary BiPAP, IV Solu-Medrol. Repeat chest x-ray 1/24 without acute findings. Discontinued BiPAP; continue flutter valve, chest PT, discontinued Solu-Medrol, wean oxygen as tolerated but likely may need home oxygen at discharge. Has not required BiPAP again. Now transitioned to oxygen via nasal cannula at 2 L/m high flow. CCM has seen and signed off and okay to transfer to floor. Outpatient follow-up with pulmonology. 3. Severe sepsis secondary to influenza: Sepsis physiology improved. Received a dose of vancomycin and Zosyn in the ED which were not continued due to lack of definitive source of bacterial infection. 4. Elevated troponin/history of CAD status post LAD stenting in 2004/chest pain 1/28: Secondary to demand ischemia from acute illness. EKG without acute changes. Troponin has decreased. Briefly on IV heparin drip which was discontinued. No chest pain reported. Echo shows normal LV function without wall motion abnormalities. Cardiology consultation and follow-up appreciated: Recommending outpatient stress test & follow-up with Dr. Elease Hashimoto. Continue aspirin, Plavix, atenolol and statins. Patient reported chest pain again on 1/28. Troponin slightly increased to 0.21. Requested cardiology to reconsult >likely no change in plan for outpatient further evaluation. 5. Lactic acidosis: Secondary to acute  illness. Resolved. 6. Essential hypertension: controlled. Continue ACEI , & atenolol. Added when necessary IV  hydralazine. DC HCTZ due to hyponatremia and hypokalemia. 7. DM 2: Mildly uncontrolled CBGs in the context of steroids. Continue NovoLog SSI, moderate sensitivity. Increased Lantus to 10 units daily. Holding oral hypoglycemics. CBGs are better. A1c: 7.7. 8. BPH/acute urinary retention: Continue Avodart and Fosamax. Foley catheter was inserted on 1/26 and remained in place for 48 hours. After discontinuing Foley catheter, again having issues with urinary retention and hence replaced on 1/29. Plan is to DC with Foley catheter and outpatient follow-up with urology. Urine microscopy not suggestive of UTI. 9. Hypomagnesemia: Replaced 10. Anemia: Stable. 11. Chronic left hemidiaphragm paralysis: Management as above. Mobilize and aggressive pulmonary toilet. 12. Leukocytosis: Secondary to steroids. Improving. 13. Hypokalemia: Replace and follow. Magnesium 1.8. 14. Acute encephalopathy/delirium: Since sometime yesterday, patient having worsening confusion, paranoia. No focal neurological deficits. Possibly Hospital/ICU delirium. Minimize opioids or psychotropic medications, reorient, family requested to meet with patient and reassure, sleep hygiene, Transfer to telemetry. If agitated, may need to consider when necessary Haldol.   DVT prophylaxis: Lovenox. Was on heparin drip until 1/3. Code Status: Full Family Communication: None at bedside. Discussed In detail with patient's spouse on 1/29. Updated care and answered questions. Updated regarding his confusion and paranoia and she stated that she and her daughter were planning to come into the hospital soon to meet with him.  Disposition Plan: Admitted to stepdown. Transfer to telemetry on 1/28. Possible discharge home in 1-2 days.  Consultants:   Cardiology-signed off.  Pulmonology  Procedures:   2-D echo 08/12/16: Study Conclusions  -  Left ventricle: The cavity size was normal. Wall thickness was   increased in a pattern of mild LVH. Systolic function was normal.   The estimated ejection fraction was in the range of 55% to 60%.   Wall motion was normal; there were no regional wall motion   abnormalities. Features are consistent with a pseudonormal left   ventricular filling pattern, with concomitant abnormal relaxation   and increased filling pressure (grade 2 diastolic dysfunction). - Aortic valve: Valve mobility was restricted. There was mild   stenosis. - Mitral valve: Calcified annulus. - Left atrium: The atrium was moderately dilated. - Right ventricle: Systolic function was mildly reduced. - Right atrium: The atrium was mildly dilated. - Pulmonary arteries: Systolic pressure was moderately increased.  Impressions:  - Normal LV systolic function; grade 2 diastolic dysfunction;   calcified aortic valve with mild AS; biatrial enlargement;   moderately elevated pulmonary pressure.   BiPAP when necessary  Foley 1/26>  Antimicrobials:   Tamiflu 1/21 >  Zosyn and vancomycin 1 dose in ED    Subjective: Ongoing confusion and paranoia. Refusing some aspects of care. Not agitated. Did not require BiPAP overnight. Oxygen has been tapered down to 2 L/m high flow nasal cannula. Still asking to go home. Recurrent urinary retention and Foley catheter had to be reinserted this morning. No reported chest pain or dyspnea.   Objective:  Vitals:   08/18/16 0611 08/18/16 0700 08/18/16 0800 08/18/16 0859  BP: (!) 112/59 (!) 111/50 138/62   Pulse: 78 71 80   Resp: 20 18 (!) 24   Temp:   97.9 F (36.6 C)   TempSrc:   Oral   SpO2: 95% 94% 92% 96%  Weight:      Height:        Intake/Output Summary (Last 24 hours) at 08/18/16 1129 Last data filed at 08/18/16 0920  Gross per 24 hour  Intake  363 ml  Output              850 ml  Net             -487 ml   Filed Weights   08/16/16 0415 08/17/16 0530  08/18/16 0442  Weight: 72.5 kg (159 lb 13.3 oz) 69.7 kg (153 lb 10.6 oz) 67.6 kg (149 lb 0.5 oz)    Examination:  General exam: Pleasant elderly male sitting up comfortably in chair this morning. Confused and paranoid. Holding onto his phone and stating that we have been refusing to let him speak with his wife. Actually there was no one on the other side of the form.  Respiratory system: clear to auscultation without wheezing or rhonchi or crackles. No increased work of breathing. Cardiovascular system: S1 & S2 heard, RRR. No JVD, murmurs, rubs, gallops or clicks. No pedal edema. Telemetry: Sinus rhythm with BBB morphology. Gastrointestinal system: Abdomen is nondistended, soft and nontender. No organomegaly or masses felt. Normal bowel sounds heard. Central nervous system: Alert and oriented 2. No focal neurological deficits. Extremities: Symmetric 5 x 5 power. Skin: No rashes, lesions or ulcers Psychiatry: Judgement and insight impaired. Confused and paranoid.     Data Reviewed: I have personally reviewed following labs and imaging studies  CBC:  Recent Labs Lab 08/11/16 1147  08/12/16 0637 08/14/16 0340 08/17/16 0345 08/17/16 2349 08/18/16 0336  WBC 12.6*  < > 16.3* 18.5* 20.8* 15.5* 12.3*  NEUTROABS 10.6*  --   --   --   --   --   --   HGB 13.8  < > 11.3* 12.5* 13.7 12.8* 12.8*  HCT 41.9  < > 35.0* 39.3 41.6 39.3 39.3  MCV 92.1  < > 92.3 94.5 90.4 89.3 90.3  PLT 201  < > 176 216 253 232 202  < > = values in this interval not displayed. Basic Metabolic Panel:  Recent Labs Lab 08/11/16 2019 08/12/16 0339 08/13/16 0326 08/16/16 0346 08/17/16 0345 08/17/16 2349 08/18/16 0336  NA  --  138 138 135 134* 133* 133*  K  --  4.2 4.9 4.3 3.6 3.0* 3.0*  CL  --  103 105 84* 78* 78* 78*  CO2  --  28 28 45* 44* 46* 45*  GLUCOSE  --  196* 171* 192* 132* 112* 102*  BUN  --  17 20 35* 42* 43* 42*  CREATININE  --  0.89 0.74 0.84 0.92 0.79 0.74  CALCIUM  --  8.1* 8.2* 9.1 9.1  8.8* 8.7*  MG 1.1* 3.2* 2.2  --   --   --  1.8  PHOS 2.8 3.5 3.3  --   --   --   --    GFR: Estimated Creatinine Clearance: 66.2 mL/min (by C-G formula based on SCr of 0.74 mg/dL). Liver Function Tests:  Recent Labs Lab 08/11/16 1147 08/12/16 0339 08/17/16 2349  AST 25 35 22  ALT 16* 14* 21  ALKPHOS 96 69 77  BILITOT 1.2 0.7 1.2  PROT 7.5 6.0* 6.1*  ALBUMIN 4.7 3.6 3.6   No results for input(s): LIPASE, AMYLASE in the last 168 hours. No results for input(s): AMMONIA in the last 168 hours. Coagulation Profile:  Recent Labs Lab 08/12/16 0339  INR 1.20   Cardiac Enzymes:  Recent Labs Lab 08/11/16 2000 08/12/16 0339 08/12/16 1247 08/17/16 1436  TROPONINI 0.45* 1.56* 1.11* 0.21*   BNP (last 3 results) No results for input(s): PROBNP in the last 8760 hours.  HbA1C: No results for input(s): HGBA1C in the last 72 hours. CBG:  Recent Labs Lab 08/17/16 1200 08/17/16 1711 08/17/16 1828 08/17/16 2123 08/18/16 0827  GLUCAP 202* 70 134* 135* 125*   Lipid Profile: No results for input(s): CHOL, HDL, LDLCALC, TRIG, CHOLHDL, LDLDIRECT in the last 72 hours. Thyroid Function Tests: No results for input(s): TSH, T4TOTAL, FREET4, T3FREE, THYROIDAB in the last 72 hours. Anemia Panel: No results for input(s): VITAMINB12, FOLATE, FERRITIN, TIBC, IRON, RETICCTPCT in the last 72 hours.  Sepsis Labs:  Recent Labs Lab 08/11/16 1811 08/11/16 2000 08/11/16 2249 08/12/16 0339  08/12/16 1121 08/14/16 0340 08/17/16 0345 08/17/16 2349 08/18/16 0336  PROCALCITON  --  0.30  --  0.43  --   --   --   --   --   --   WBC 14.4*  --   --   --   < >  --  18.5* 20.8* 15.5* 12.3*  LATICACIDVEN 8.6* 6.6* 3.2*  --   --  0.7  --   --   --   --   < > = values in this interval not displayed.  Recent Results (from the past 240 hour(s))  MRSA PCR Screening     Status: None   Collection Time: 08/11/16  6:07 PM  Result Value Ref Range Status   MRSA by PCR NEGATIVE NEGATIVE Final     Comment:        The GeneXpert MRSA Assay (FDA approved for NASAL specimens only), is one component of a comprehensive MRSA colonization surveillance program. It is not intended to diagnose MRSA infection nor to guide or monitor treatment for MRSA infections.          Radiology Studies: Dg Chest Port 1 View  Result Date: 08/17/2016 CLINICAL DATA:  Acute on chronic respiratory failure EXAM: PORTABLE CHEST 1 VIEW COMPARISON:  08/13/2016 FINDINGS: Elevated left hemidiaphragm. Cardiomegaly. Mild vascular congestion. Probable atelectasis at the right base. No visible effusions or acute bony abnormality. IMPRESSION: No significant change. Electronically Signed   By: Charlett Nose M.D.   On: 08/17/2016 09:05        Scheduled Meds: . aspirin EC  81 mg Oral QHS  . atenolol  25 mg Oral QHS  . atorvastatin  40 mg Oral QHS  . clopidogrel  75 mg Oral QHS  . dutasteride  0.5 mg Oral QHS  . enoxaparin (LOVENOX) injection  40 mg Subcutaneous Q24H  . guaiFENesin  1,200 mg Oral BID  . hydrochlorothiazide  25 mg Oral QHS  . insulin aspart  0-15 Units Subcutaneous TID WC  . insulin aspart  0-5 Units Subcutaneous QHS  . insulin glargine  10 Units Subcutaneous Daily  . ipratropium-albuterol  3 mL Nebulization QID  . potassium chloride  40 mEq Oral Q4H  . ramipril  10 mg Oral QHS  . sodium chloride flush  3 mL Intravenous Q12H  . sodium chloride flush  3 mL Intravenous Q12H  . tamsulosin  0.4 mg Oral QHS   Continuous Infusions:   LOS: 7 days        [facility], MD [facility] Pager [phone]  If 7PM-7AM, please contact night-coverage [email or web address] Password [facility] 08/18/2016, 11:29 AM

## 2016-08-18 NOTE — Progress Notes (Signed)
Pt confused and agitated with shift change. Pt found without nasal cannula in nose. Pt refused to let staff assist. Wife called to help calm down patient. Day shift nurse administered haldol. Wife called pt on phone in room and talked to him. Pt agreed to put oxygen back in nose. Pt complained of SOB and difficulty breathing, RT up to give scheduled breathing treatment. Oxygen bumped up to 5L and pt placed on continuous pulse ox. Pt now resting comfortably in bed, oxygen bumped down to 3L currently. Will continue to monitor closely and reassess.

## 2016-08-18 NOTE — Progress Notes (Signed)
Name: Angel [name] MRN: [id number] DOB: 04-Sep-1935    ADMISSION DATE:  08/11/2016 CONSULTATION DATE:  1/26  REFERRING MD :  Waymon Amato   CHIEF COMPLAINT:  Hypoxia   BRIEF PATIENT DESCRIPTION:  81 year old male w/ known h/o paralyzed left HD. Admitted 1/22 w/ influenza A and acute hypoxic respiratory failure. Treated w/ tamiflu and intermittent BIPAP. PCCM asked to see 1/26 as still needing higher levels of oxygen.   SUBJECTIVE:  Pt weaned to 2L/Angel [name].    VITAL SIGNS: Temp:  [97.5 F (36.4 C)-98.4 F (36.9 C)] 97.9 F (36.6 C) (01/29 0800) Pulse Rate:  [64-80] 80 (01/29 0800) Resp:  [12-36] 24 (01/29 0800) BP: (93-138)/(48-79) 138/62 (01/29 0800) SpO2:  [88 %-100 %] 96 % (01/29 0859) Weight:  [149 lb 0.5 oz (67.6 kg)] 149 lb 0.5 oz (67.6 kg) (01/29 0442)  Intake/Output Summary (Last 24 hours) at 08/18/16 1044 Last data filed at 08/18/16 0920  Gross per 24 hour  Intake              363 ml  Output             1025 ml  Net             -662 ml   PHYSICAL EXAMINATION: General:  Elderly male in NAD lying in bed HEENT: MM pink/moist, no jvd PSY: mild confusion, easily reoriented  Neuro: Awake, alert, speech clear, MAE, normal strength CV: s1s2 rrr, no m/r/g PULM: even/non-labored on 2L HFNC, clear on R, diminished on L WU:[id number], non-tender, bsx4 active  Extremities: warm/dry, no edema  Skin: no rashes or lesions    Recent Labs Lab 08/17/16 0345 08/17/16 2349 08/18/16 0336  NA 134* 133* 133*  K 3.6 3.0* 3.0*  CL 78* 78* 78*  CO2 44* 46* 45*  BUN 42* 43* 42*  CREATININE 0.92 0.79 0.74  GLUCOSE 132* 112* 102*    Recent Labs Lab 08/17/16 0345 08/17/16 2349 08/18/16 0336  HGB 13.7 12.8* 12.8*  HCT 41.6 39.3 39.3  WBC 20.8* 15.5* 12.3*  PLT 253 232 202   Dg Chest Port 1 View  Result Date: 08/17/2016 CLINICAL DATA:  Acute on chronic respiratory failure EXAM: PORTABLE CHEST 1 VIEW COMPARISON:  08/13/2016 FINDINGS: Elevated left hemidiaphragm. Cardiomegaly. Mild  vascular congestion. Probable atelectasis at the right base. No visible effusions or acute bony abnormality. IMPRESSION: No significant change. Electronically Signed   By: Charlett Nose M.D.   On: 08/17/2016 09:05   SIGNIFICANT EVENTS  1/26  Diuresed, oxygenation stable 1/28  R basilar atx, ? Mucus plug.  Chest pain episode when O2 decreased > treated with morphine, NTG.  Trop neg  STUDIES:  CT Chest 1/22 >> marked volume loss in the left hemithorax, elevation of left upper quadrant abdominal contents in the lower left chest, no left hemi-diaphragmatic tissue seen overlying the abdominal contents in this may represent a large diaphragmatic hernia (similar to 14 years ago), CAD, emphysema   ECHO 1/23 - Normal LV systolic function; grade 2 diastolic dysfunction;   calcified aortic valve with mild AS; biatrial enlargement;   moderately elevated pulmonary pressure. - Left atrium: The atrium was moderately dilated. - Right ventricle: Systolic function was mildly reduced. - Right atrium: The atrium was mildly dilated. - Pulmonary arteries: Systolic pressure was moderately increased. CT chest 1/22: marked volume loss on left. No edema or infiltrate   ASSESSMENT / PLAN: Acute hypoxic respiratory failure - improving Acute pulmonary edema due to volume resuscitation -  resolved Chronic left hemidiaphragm paralysis (etiology never identified) Influenza A Aortic stenosis Grade 2 diastolic dysfunction w/ preserved EF Pulmonary hypertension due to acute hypoxemia from pulmonary edema Restrictive Lung Disease - elevated L hemidiaphragm Demand ischemia H/o CAD H/o HTN H/o DM Centrilobular emphysema  Discussion: 81 year old male w/ known h/o paralyzed left HD. Admitted 1/22 w/ influenza A and acute hypoxic respiratory failure. Treated w/ tamiflu and intermittent BIPAP. CXR improved after diuresis but remained hypoxemic.  O2 now weaned to 2L HFNC.     Plan: Wean O2 to off for sats > 90% Will  need ambulatory O2 assessment prior to discharge  OOB, walk with PT Chest PT, IS, flutter Continue mucinex, duoneb Q6 + PRN Minimize sedating medications  Intermittent CXR  Consider lasix 1/30 pending I/O review, exam Will need to follow up with pulmonary as outpatient, consider PFT's at time of follow up Repeat ECHO as outpatient   PCCM will be available PRN.  Pt can likely transition out of SDU.   Canary BrimBrandi Ollis, NP-C [facility] Pgr: [phone] or if no answer [phone] 08/18/2016, 10:44 AM

## 2016-08-18 NOTE — Progress Notes (Signed)
Patient confused, pulling at his catheter and oxygen.  He refused to let staff assist or give care.  MD informed.  Gave verbal orders.  Oncoming nurse aware.

## 2016-08-18 NOTE — Progress Notes (Signed)
Patient refusing to take medications. Patient is confused and he verbally expressions concerns of us not letting him talk with his wife. Wife called twice and patient talked with her. He is experiencing paranoia. Wife and daughter arrive to the room. Family persuades patient to take medications and reassures him of our attempts to take good care of him. Will continue to monitor patient.

## 2016-08-18 NOTE — Progress Notes (Signed)
Progress Note  Patient Name: Angel [name] Date of Encounter: 08/18/2016  Primary Cardiologist: Dr. Elease Hashimoto  Subjective   Pt confused and paranoid earlier this am- better now with family in the room. He denies having any chest pain to me. His main complaint is SOB.   Inpatient Medications    Scheduled Meds: . aspirin EC  81 mg Oral QHS  . atenolol  25 mg Oral QHS  . atorvastatin  40 mg Oral QHS  . clopidogrel  75 mg Oral QHS  . dutasteride  0.5 mg Oral QHS  . enoxaparin (LOVENOX) injection  40 mg Subcutaneous Q24H  . guaiFENesin  1,200 mg Oral BID  . hydrochlorothiazide  25 mg Oral QHS  . insulin aspart  0-15 Units Subcutaneous TID WC  . insulin aspart  0-5 Units Subcutaneous QHS  . insulin glargine  10 Units Subcutaneous Daily  . ipratropium-albuterol  3 mL Nebulization QID  . potassium chloride  40 mEq Oral Q4H  . ramipril  10 mg Oral QHS  . sodium chloride flush  3 mL Intravenous Q12H  . sodium chloride flush  3 mL Intravenous Q12H  . tamsulosin  0.4 mg Oral QHS   Continuous Infusions:  PRN Meds: acetaminophen, hydrALAZINE, ipratropium-albuterol, nitroGLYCERIN, ondansetron **OR** ondansetron (ZOFRAN) IV, orphenadrine, sodium chloride flush, traMADol   Vital Signs    Vitals:   08/18/16 0611 08/18/16 0700 08/18/16 0800 08/18/16 0859  BP: (!) 112/59 (!) 111/50 138/62   Pulse: 78 71 80   Resp: 20 18 (!) 24   Temp:   97.9 F (36.6 C)   TempSrc:   Oral   SpO2: 95% 94% 92% 96%  Weight:      Height:        Intake/Output Summary (Last 24 hours) at 08/18/16 1043 Last data filed at 08/18/16 0920  Gross per 24 hour  Intake              363 ml  Output             1025 ml  Net             -662 ml   Filed Weights   08/16/16 0415 08/17/16 0530 08/18/16 0442  Weight: 159 lb 13.3 oz (72.5 kg) 153 lb 10.6 oz (69.7 kg) 149 lb 0.5 oz (67.6 kg)    Telemetry    NSR in 90s today - Personally Reviewed  ECG    08/11/16 1143: Sinus rhythm; Borderline left axis  deviation   Physical Exam   General: Well developed, well nourished, male using accessory muscles to breathe, answers questions appropriately. Head: Normocephalic, atraumatic.  Neck: Supple without bruits Lungs:  Coarse sounds throughout, supplemental oxygen via [place] in place Heart: Irregular rhythm, regular rate, S1, S2, holosystolic murmur grade 3/6 on left and right upper sternal boarders; exam difficult 2/ coarse breath sounds. Abdomen: Soft, non-tender, non-distended with normoactive bowel sounds. No hepatomegaly. No rebound/guarding. No obvious abdominal masses. Extremities: Distal pedal pulses are 1+ bilaterally. Neuro: Alert and oriented X 3. Moves all extremities spontaneously. Psych: Normal affect currently but aggitated and paranoid earlier this am  Labs    Chemistry Recent Labs Lab 08/11/16 1147  08/12/16 0339  08/17/16 0345 08/17/16 2349 08/18/16 0336  NA 139  < > 138  < > 134* 133* 133*  K 4.6  < > 4.2  < > 3.6 3.0* 3.0*  CL 96*  < > 103  < > 78* 78* 78*  CO2 33*  < >  28  < > 44* 46* 45*  GLUCOSE 203*  < > 196*  < > 132* 112* 102*  BUN 17  < > 17  < > 42* 43* 42*  CREATININE 0.93  < > 0.89  < > 0.92 0.79 0.74  CALCIUM 9.8  < > 8.1*  < > 9.1 8.8* 8.7*  PROT 7.5  --  6.0*  --   --  6.1*  --   ALBUMIN 4.7  --  3.6  --   --  3.6  --   AST 25  --  35  --   --  22  --   ALT 16*  --  14*  --   --  21  --   ALKPHOS 96  --  69  --   --  77  --   BILITOT 1.2  --  0.7  --   --  1.2  --   GFRNONAA >60  < > >60  < > >60 >60 >60  GFRAA >60  < > >60  < > >60 >60 >60  ANIONGAP 10  < > 7  < > 12 9 10   < > = values in this interval not displayed.   Hematology  Recent Labs Lab 08/17/16 0345 08/17/16 2349 08/18/16 0336  WBC 20.8* 15.5* 12.3*  RBC 4.60 4.40 4.35  HGB 13.7 12.8* 12.8*  HCT 41.6 39.3 39.3  MCV 90.4 89.3 90.3  MCH 29.8 29.1 29.4  MCHC 32.9 32.6 32.6  RDW 13.2 13.0 13.2  PLT 253 232 202    Cardiac Enzymes  Recent Labs Lab 08/11/16 2000  08/12/16 0339 08/12/16 1247 08/17/16 1436  TROPONINI 0.45* 1.56* 1.11* 0.21*     Recent Labs Lab 08/11/16 1204  TROPIPOC 0.02     BNP  Recent Labs Lab 08/11/16 1147 08/12/16 0637 08/16/16 0346  BNP 174.6* 403.6* 156.8*     DDimer No results for input(s): DDIMER in the last 168 hours.   Radiology    Dg Chest Port 1 View  Result Date: 08/17/2016 CLINICAL DATA:  Acute on chronic respiratory failure EXAM: PORTABLE CHEST 1 VIEW COMPARISON:  08/13/2016 FINDINGS: Elevated left hemidiaphragm. Cardiomegaly. Mild vascular congestion. Probable atelectasis at the right base. No visible effusions or acute bony abnormality. IMPRESSION: No significant change. Electronically Signed   By: Charlett NoseKevin  Dover M.D.   On: 08/17/2016 09:05    Cardiac Studies   Echocardiogram 1/223/18  Study Conclusions - Left ventricle: The cavity size was normal. Wall thickness was increased in a pattern of mild LVH. Systolic function was normal. The estimated ejection fraction was in the range of 55% to 60%.  Wall motion was normal; there were no regional wall motion abnormalities. Features are consistent with a pseudonormal left ventricular filling pattern, with concomitant abnormal relaxation and increased filling pressure (grade 2 diastolic dysfunction). - Aortic valve: Valve mobility was restricted. There was mild stenosis. - Mitral valve: Calcified annulus. - Left atrium: The atrium was moderately dilated. - Right ventricle: Systolic function was mildly reduced. - Right atrium: The atrium was mildly dilated. - Pulmonary arteries: Systolic pressure was moderately increased.  Impressions: - Normal LV systolic function; grade 2 diastolic dysfunction; calcified aortic valve with mild AS; biatrial enlargement; moderately elevated pulmonary pressure.    Left heart cath 10/22/02: CONCLUSIONS: 1. Primarily single-vessel coronary artery disease involving the left anterior descending artery and diagonal  systems. 2. Successful percutaneous transluminal coronary angioplasty and stenting of the proximal and mid left anterior descending  artery.  Patient Profile     81 y.o. male with a PMH significant for CAD s/p PTCA and stenting of the LAD in 2004, paralyzed left hemidiaphragm, DM, HTN, and HLD. He presented to ED on 08/11/16 with worsening dyspnea, wheezing, and cough. He was admitted for management of SOB and found to be influenza positive. His Troponin was elevated- 0.02-.045-1.56-1.11-0.21. Cardiology was consulted for elevated troponin. The plan was for OP f/u when he recovered from his URI. We signed off on the 24th but had been asked to re consult. Apparently the pt was nearing discharge but complained of chest pain and was transferred back to the ICU. This morning he was agitated and paranoid. He denies chest pain now.   Assessment & Plan    1. History of CAD s/p stenting of LAD in 2004 - Continue ASA, plavix, atenolol, statin - holding home HCTZ and ramipril for marginal pressures   2. Elevated troponin this admission - troponin trending down: pattern consistent with NSTEMI. - echo with normal LV function, without new wall motion abnormalities, grade 2 diastolic dysfunction, and mild AS - given his lack of symptoms, downward trending troponin, and normal echo in the presence of infection, recommend outpatient evaluation of ischemic disease via stress test  - will discuss with MD-not sure he is a candidate for cath secondary to acute illness and ? mental status.    3. COPD exacerbation secondary to influenza - duonebs, prednisone, tamiflu - BiPAP and management per primary team   4. HTN - controlled - management per primary team   5. Moderate Aortic Stenosis - noted on echo on 02/04/16 - echo 08/12/16 with mild AS-normal LVF - will follow as OP in clinic   6. DM - lisproSS, per primary team   7. BPH - continue avodart and flomax - management per primary  team   Signed, Corine Shelter , PA-C 10:43 AM 08/18/2016 Pager: [phone]  History and all data above reviewed.  Patient examined.  I agree with the findings as above.  He says that he did not have chest pain.  His troponin is trending down.  The patient exam reveals COR:RRR, no rub  ,  Lungs: Clear  ,  Abd: Positive bowel sounds, no rebound no guarding, Ext No edema .  All available labs, radiology testing, previous records reviewed. Agree with documented assessment and plan. DEMAND ISCHEMIA:  Plan out patient Lexiscan Myoview.  Angel [name]  11:52 AM  08/18/2016

## 2016-08-18 NOTE — Evaluation (Signed)
Physical Therapy Evaluation Patient Details Name: Angel [name] MRN: [id number] DOB: 02-17-1936 Today's Date: 08/18/2016   History of Present Illness  81 yo male admitted with respiratory failure, +flu, viral bronchitis, HF. Hx of hernia, DM, CAD.   Clinical Impression  On eval, pt required Min +2 assist for mobility. He walked ~30 feet in room with 2 HHA. O2 sats 88% on 2L O2. Mod multimodal cues required at times. Pt was mildly-moderately anxious intermittently during session. Required cues for slow, pursed lip breathing to slow RR. Family present during session (wife, daughter). Discussed d/c plan-family reported pt will return home. Recommend HHPT and 24 hour supervision/assist as long as family can provide current level of care. Will follow and progress activity as tolerated.     Follow Up Recommendations Home health PT;Supervision/Assistance - 24 hour    Equipment Recommendations  Rolling walker with 5" wheels (continuing to assess)    Recommendations for Other Services       Precautions / Restrictions Precautions Precautions: Fall Precaution Comments: multiple lines/leads; droplet +flu Restrictions Weight Bearing Restrictions: No      Mobility  Bed Mobility Overal bed mobility: Needs Assistance Bed Mobility: Supine to Sit     Supine to sit: Min assist;HOB elevated     General bed mobility comments: Assist for trunk. VCs for safety, technique. Increased time.   Transfers Overall transfer level: Needs assistance Equipment used: 1 person hand held assist;2 person hand held assist Transfers: Sit to/from [employer] CorporationStand;Stand Pivot Transfers Sit to Stand: Min assist;+2 physical assistance;+2 safety/equipment Stand pivot transfers: Min assist;+2 physical assistance;+2 safety/equipment       General transfer comment: Assist to rise, stabilize, control descent. Mod multimodal cues required.   Ambulation/Gait Ambulation/Gait assistance: Min assist;+2 physical assistance;+2  safety/equipment Ambulation Distance (Feet): 30 Feet Assistive device: 2 person hand held assist;1 person hand held assist       General Gait Details: Assist to stabilize pt. Pt is unsteady. O2 sats 88% on 2L O2, dyspnea 2/4.   Stairs            Wheelchair Mobility    Modified Rankin (Stroke Patients Only)       Balance Overall balance assessment: Needs assistance           Standing balance-Leahy Scale: Poor                               Pertinent Vitals/Pain Pain Assessment: No/denies pain    Home Living Family/patient expects to be discharged to:: Private residence Living Arrangements: Spouse/significant other Available Help at Discharge: Family Type of Home: House Home Access: Stairs to enter Entrance Stairs-Rails: Right Entrance Stairs-Number of Steps: 4 Home Layout: One level Home Equipment: None      Prior Function Level of Independence: Independent               Hand Dominance        Extremity/Trunk Assessment   Upper Extremity Assessment Upper Extremity Assessment: Generalized weakness    Lower Extremity Assessment Lower Extremity Assessment: Generalized weakness    Cervical / Trunk Assessment Cervical / Trunk Assessment: Normal  Communication   Communication: No difficulties  Cognition Arousal/Alertness: Awake/alert Behavior During Therapy: Anxious Overall Cognitive Status: Difficult to assess                 General Comments: at times, pt would not respond.     General Comments      Exercises  Assessment/Plan    PT Assessment Patient needs continued PT services  PT Problem List Decreased strength;Decreased mobility;Decreased activity tolerance;Decreased knowledge of use of DME;Decreased balance          PT Treatment Interventions DME instruction;Therapeutic activities;Therapeutic exercise;Gait training;Balance training;Functional mobility training    PT Goals (Current goals can be found in  the Care Plan section)  Acute Rehab PT Goals Patient Stated Goal: for pt to return home after hospital stay PT Goal Formulation: With patient/family Time For Goal Achievement: 09/01/16 Potential to Achieve Goals: Good    Frequency Min 3X/week   Barriers to discharge        Co-evaluation               End of Session Equipment Utilized During Treatment: Gait belt Activity Tolerance: Patient tolerated treatment well Patient left: in chair;with call bell/phone within reach;with chair alarm set;with family/visitor present           Time: 1349-1419 PT Time Calculation (min) (ACUTE ONLY): 30 min   Charges:   PT Evaluation $PT Eval Low Complexity: 1 Procedure PT Treatments $Gait Training: 8-22 mins   PT G Codes:        Rebeca Alert, MPT Pager: [phone]

## 2016-08-18 NOTE — Progress Notes (Signed)
RN in room at 2245 to administer medications.  Patient very drowsy, unable to consistently follow commands. VSS.  Dr. Hugelmyer paged and Dr. Clyde LuDahlia BailiffndborgNiu to bedside to assess.  By [id number], pt more alert and able to follow commands.  Oriented to person and place.  Refusing to take medications.  MD aware.   Additionally, foley discontinued at 1100.  No urine output since.  Bladder scan showed >600cc.  Verbal order from Dr. Dahlia BailiffHugelmyer for in and out cath.  850cc of urine drained.  Will continue to monitor.

## 2016-08-19 ENCOUNTER — Inpatient Hospital Stay (HOSPITAL_COMMUNITY): Payer: Commercial Managed Care - HMO

## 2016-08-19 DIAGNOSIS — J209 Acute bronchitis, unspecified: Secondary | ICD-10-CM

## 2016-08-19 LAB — BLOOD GAS, ARTERIAL
Acid-Base Excess: 16.5 mmol/L — ABNORMAL HIGH (ref 0.0–2.0)
Bicarbonate: 42 mmol/L — ABNORMAL HIGH (ref 20.0–28.0)
Drawn by: 11249
O2 Content: 5 L/min
O2 Saturation: 97.1 %
Patient temperature: 97.8
pCO2 arterial: 49.3 mmHg — ABNORMAL HIGH (ref 32.0–48.0)
pH, Arterial: 7.538 — ABNORMAL HIGH (ref 7.350–7.450)
pO2, Arterial: 88.9 mmHg (ref 83.0–108.0)

## 2016-08-19 LAB — GLUCOSE, CAPILLARY
Glucose-Capillary: 183 mg/dL — ABNORMAL HIGH (ref 65–99)
Glucose-Capillary: 156 mg/dL — ABNORMAL HIGH (ref 65–99)
Glucose-Capillary: 150 mg/dL — ABNORMAL HIGH (ref 65–99)
Glucose-Capillary: 116 mg/dL — ABNORMAL HIGH (ref 65–99)

## 2016-08-19 LAB — BASIC METABOLIC PANEL
Anion gap: 10 (ref 5–15)
BUN: 28 mg/dL — ABNORMAL HIGH (ref 6–20)
CO2: 37 mmol/L — ABNORMAL HIGH (ref 22–32)
Calcium: 8.6 mg/dL — ABNORMAL LOW (ref 8.9–10.3)
Chloride: 84 mmol/L — ABNORMAL LOW (ref 101–111)
Creatinine, Ser: 0.86 mg/dL (ref 0.61–1.24)
GFR calc Af Amer: >60 mL/min (ref >60)
GFR calc non Af Amer: >60 mL/min (ref >60)
Glucose, Bld: 186 mg/dL — ABNORMAL HIGH (ref 65–99)
Potassium: 3.8 mmol/L (ref 3.5–5.1)
Sodium: 131 mmol/L — ABNORMAL LOW (ref 135–145)

## 2016-08-19 MED ORDER — HALOPERIDOL LACTATE 5 MG/ML IJ SOLN
2.0000 mg | Freq: Once | INTRAMUSCULAR | Status: AC
  Administered 2016-08-19: 2 mg via INTRAVENOUS
  Filled 2016-08-19: qty 1

## 2016-08-19 MED ORDER — HALOPERIDOL LACTATE 5 MG/ML IJ SOLN
1.0000 mg | Freq: Once | INTRAMUSCULAR | Status: AC
  Administered 2016-08-19: 1 mg via INTRAVENOUS
  Filled 2016-08-19: qty 1

## 2016-08-19 NOTE — Progress Notes (Signed)
PROGRESS NOTE  Angel [name]  ZDG:[id number] DOB: 1936-05-16  DOA: 08/11/2016 PCP: Jackie PlumSEI-BONSU,GEORGE, MD   Brief Narrative:  81 year old male with PMH of CAD status post LAD stenting, DM 2, HLD, HTN, BPH, paralyzed left diaphragm,? Asthma, lifelong nonsmoker, presented to ED on 08/11/16 with 3 days history of progressively worsening dyspnea, dry cough and wheezing. He was admitted to stepdown unit for acute hypoxic respiratory failure secondary to influenza related respiratory illness and possible asthma exacerbation. Despite aggressive treatment, continued to be hypoxic requiring high-volume oxygen and intermittent BiPAP. Pulmonology consulted on 1/26 and suspect some of this is related to chronically paralyzed left hemidiaphragm and decompensated diastolic CHF. Improved. Telemetry transfer was planned on 1/28 but he reported worsening chest pain and dyspnea. Hypoxia has improved. Chest pain is resolved. Cardiology reconsulting. CCM has seen and cleared for transfer to medical bed. Transferring to telemetry 1/29. New onset acute confusion and ongoing urinary retention-Foley catheter reinserted. Slowly improving. Possible DC home 1/31.   Assessment & Plan:   Principal Problem:   Acute respiratory failure with hypoxia (HCC) Active Problems:   CAD (coronary artery disease)   Diabetes mellitus (HCC)   HTN (hypertension)   Hyperlipidemia   COPD exacerbation (HCC)   Influenza   Hypertensive heart disease without heart failure   Demand ischemia (HCC)   S/P coronary artery stent placement   Acute urinary retention   Chest pain on breathing   Acute on chronic respiratory failure with hypoxemia (HCC)   Diaphragm paralysis   Acute encephalopathy   Influenza with pneumonia   1. Influenza A with acute viral bronchitis &? Asthma exacerbation: No formal diagnosis of asthma or COPD. Influenza A PCR positive and B PCR negative. Completed 5 day course of Tamiflu. Supportive treatment. No clinical  bronchospasm > steroids discontinued 1/28. 2. Acute respiratory failure with hypoxia: Had required high-volume oxygen and intermittent BiPAP since admission. Initially felt to be related to acute viral bronchitis from influenza and possible asthma exacerbation and treated likewise. Despite this treatment, continued to be hypoxic and hence pulmonology was consulted on 1/26. As per pulmonology, acute hypoxic respiratory failure is multifactorial related to influenza A complicated by acute diastolic CHF, decompensated cor pulmonale with elevated PAP's in the setting of severe restrictive lung disease due to left hemidiaphragm paralysis and they doubt PE. Treated with PRN Lasix IV, continued bronchodilator nebulizations, when necessary BiPAP, IV Solu-Medrol. Repeat chest x-ray 1/24 without acute findings. Discontinued BiPAP; continue flutter valve, chest PT, discontinued Solu-Medrol, wean oxygen as tolerated but likely may need home oxygen at discharge. Has not required BiPAP again. CCM signed off. Outpatient follow-up with pulmonology. Since transfer to floor, patient had intermittent episodes of dyspnea and hypoxia which are precipitated by acute anxiety/agitation and resolves after he calms down. Discussed extensively with patient's daughter and spouse at bedside and a family member plans to be within 24/7 to discharge tomorrow which should help. Overall improving. Currently 93% on 1 L oxygen. 3. Severe sepsis secondary to influenza: Sepsis physiology improved. Received a dose of vancomycin and Zosyn in the ED which were not continued due to lack of definitive source of bacterial infection. 4. Elevated troponin/history of CAD status post LAD stenting in 2004/chest pain 1/28: Secondary to demand ischemia from acute illness. EKG without acute changes. Troponin has decreased. Briefly on IV heparin drip which was discontinued. No chest pain reported. Echo shows normal LV function without wall motion abnormalities.  Cardiology consultation and follow-up appreciated: Recommending outpatient stress test & follow-up with Dr.  Nahser. Continue aspirin, Plavix, atenolol and statins. Patient reported chest pain again on 1/28. Troponin slightly increased to 0.21. As per cardiology>no change in plan for outpatient further evaluation. 5. Lactic acidosis: Secondary to acute illness. Resolved. 6. Essential hypertension: controlled. Continue ACEI , & atenolol. Added when necessary IV hydralazine. DC HCTZ due to hyponatremia and hypokalemia.Controlled. 7. DM 2: Mildly uncontrolled CBGs in the context of steroids. Continue NovoLog SSI, moderate sensitivity. Increased Lantus to 10 units daily. Holding oral hypoglycemics. CBGs are better. A1c: 7.7. 8. BPH/acute urinary retention: Continue Avodart and Fosamax. Foley catheter was inserted on 1/26 and remained in place for 48 hours. After discontinuing Foley catheter, again having issues with urinary retention and hence replaced on 1/29. Plan is to DC with Foley catheter and outpatient follow-up with Urology/Dr. Annabell Howells in 1 week after discharge. Urine microscopy not suggestive of UTI. 9. Hypomagnesemia: Replaced 10. Anemia: Stable. 11. Chronic left hemidiaphragm paralysis: Management as above. Mobilize and aggressive pulmonary toilet. 12. Leukocytosis: Secondary to steroids. Improving. 13. Hypokalemia: Replace and follow. Magnesium 1.8. 14. Acute encephalopathy/delirium: Since sometime 1/28, patient having worsening confusion, paranoia. No focal neurological deficits. Possibly Hospital/ICU delirium. Minimize opioids or psychotropic medications, reorient. Patient does well when one of his family members is at bedside. Discussed extensively with them this morning and one of them is willing to stay with him 24/7 in the hospital until likely discharge 1/31. Improved this morning.  DVT prophylaxis: Lovenox. Was on heparin drip until 1/3. Code Status: Full Family Communication: Discussed in  detail with patient's spouse and daughter at bedside.  Disposition Plan: Admitted to stepdown. Transfer to telemetry on 1/28. Possible discharge home 1/31  Consultants:   Cardiology-signed off.  Pulmonology  Procedures:   2-D echo 08/12/16: Study Conclusions  - Left ventricle: The cavity size was normal. Wall thickness was   increased in a pattern of mild LVH. Systolic function was normal.   The estimated ejection fraction was in the range of 55% to 60%.   Wall motion was normal; there were no regional wall motion   abnormalities. Features are consistent with a pseudonormal left   ventricular filling pattern, with concomitant abnormal relaxation   and increased filling pressure (grade 2 diastolic dysfunction). - Aortic valve: Valve mobility was restricted. There was mild   stenosis. - Mitral valve: Calcified annulus. - Left atrium: The atrium was moderately dilated. - Right ventricle: Systolic function was mildly reduced. - Right atrium: The atrium was mildly dilated. - Pulmonary arteries: Systolic pressure was moderately increased.  Impressions:  - Normal LV systolic function; grade 2 diastolic dysfunction;   calcified aortic valve with mild AS; biatrial enlargement;   moderately elevated pulmonary pressure.   BiPAP when necessary  Foley 1/26>  Antimicrobials:   Tamiflu 1/21 >  Zosyn and vancomycin 1 dose in ED    Subjective: Overnight events noted. Confusion, agitation, dyspnea and hypoxia. All of these have improved since his family came to visit him this morning. Currently denies dyspnea, cough or chest pain. Pleasant and cooperative.   Objective:  Vitals:   08/19/16 0551 08/19/16 0843 08/19/16 1119 08/19/16 1343  BP:    114/61  Pulse:  80 83 82  Resp:  (!) 28 (!) 25 (!) 26  Temp:    97.5 F (36.4 C)  TempSrc:    Oral  SpO2: 98% 96% 92% 93%  Weight:      Height:        Intake/Output Summary (Last 24 hours) at 08/19/16 1424 Last data  filed at  08/19/16 1344  Gross per 24 hour  Intake              483 ml  Output             2625 ml  Net            -2142 ml   Filed Weights   08/17/16 0530 08/18/16 0442 08/19/16 0541  Weight: 69.7 kg (153 lb 10.6 oz) 67.6 kg (149 lb 0.5 oz) 64.1 kg (141 lb 5 oz)    Examination:  General exam: Pleasant elderly male lying comfortably propped up in bed without distress. Respiratory system: clear to auscultation without wheezing or rhonchi or crackles. No increased work of breathing. Cardiovascular system: S1 & S2 heard, RRR. No JVD, murmurs, rubs, gallops or clicks. No pedal edema. Telemetry: Sinus rhythm with BBB morphology. Gastrointestinal system: Abdomen is nondistended, soft and nontender. No organomegaly or masses felt. Normal bowel sounds heard. Foley catheter without gross hematuria. Central nervous system: Alert and oriented 2. No focal neurological deficits. Extremities: Symmetric 5 x 5 power. Skin: No rashes, lesions or ulcers Psychiatry: Judgement and insight impaired. Improved.    Data Reviewed: I have personally reviewed following labs and imaging studies  CBC:  Recent Labs Lab 08/14/16 0340 08/17/16 0345 08/17/16 2349 08/18/16 0336  WBC 18.5* 20.8* 15.5* 12.3*  HGB 12.5* 13.7 12.8* 12.8*  HCT 39.3 41.6 39.3 39.3  MCV 94.5 90.4 89.3 90.3  PLT 216 253 232 202   Basic Metabolic Panel:  Recent Labs Lab 08/13/16 0326 08/16/16 0346 08/17/16 0345 08/17/16 2349 08/18/16 0336 08/19/16 0530  NA 138 135 134* 133* 133* 131*  K 4.9 4.3 3.6 3.0* 3.0* 3.8  CL 105 84* 78* 78* 78* 84*  CO2 28 45* 44* 46* 45* 37*  GLUCOSE 171* 192* 132* 112* 102* 186*  BUN 20 35* 42* 43* 42* 28*  CREATININE 0.74 0.84 0.92 0.79 0.74 0.86  CALCIUM 8.2* 9.1 9.1 8.8* 8.7* 8.6*  MG 2.2  --   --   --  1.8  --   PHOS 3.3  --   --   --   --   --    GFR: Estimated Creatinine Clearance: 61.6 mL/min (by C-G formula based on SCr of 0.86 mg/dL). Liver Function Tests:  Recent Labs Lab  08/17/16 2349  AST 22  ALT 21  ALKPHOS 77  BILITOT 1.2  PROT 6.1*  ALBUMIN 3.6   No results for input(s): LIPASE, AMYLASE in the last 168 hours. No results for input(s): AMMONIA in the last 168 hours. Coagulation Profile: No results for input(s): INR, PROTIME in the last 168 hours. Cardiac Enzymes:  Recent Labs Lab 08/17/16 1436  TROPONINI 0.21*   BNP (last 3 results) No results for input(s): PROBNP in the last 8760 hours. HbA1C: No results for input(s): HGBA1C in the last 72 hours. CBG:  Recent Labs Lab 08/18/16 0827 08/18/16 1238 08/18/16 2112 08/19/16 0733 08/19/16 1139  GLUCAP 125* 189* 241* 183* 156*   Lipid Profile: No results for input(s): CHOL, HDL, LDLCALC, TRIG, CHOLHDL, LDLDIRECT in the last 72 hours. Thyroid Function Tests: No results for input(s): TSH, T4TOTAL, FREET4, T3FREE, THYROIDAB in the last 72 hours. Anemia Panel: No results for input(s): VITAMINB12, FOLATE, FERRITIN, TIBC, IRON, RETICCTPCT in the last 72 hours.  Sepsis Labs:  Recent Labs Lab 08/14/16 0340 08/17/16 0345 08/17/16 2349 08/18/16 0336  WBC 18.5* 20.8* 15.5* 12.3*    Recent Results (from the past 240 hour(s))  MRSA PCR Screening     Status: None   Collection Time: 08/11/16  6:07 PM  Result Value Ref Range Status   MRSA by PCR NEGATIVE NEGATIVE Final    Comment:        The GeneXpert MRSA Assay (FDA approved for NASAL specimens only), is one component of a comprehensive MRSA colonization surveillance program. It is not intended to diagnose MRSA infection nor to guide or monitor treatment for MRSA infections.          Radiology Studies: Dg Chest Port 1 View  Result Date: 08/19/2016 CLINICAL DATA:  Hypoxia.  Shortness of breath. EXAM: PORTABLE CHEST 1 VIEW COMPARISON:  Radiographs 08/17/2016, CT 08/11/2016 FINDINGS: Lower lung volumes from prior exam. Stable elevation of the right hemidiaphragm and rightward mediastinal shift. Unchanged mild vascular  congestion. No confluent airspace disease. Calcified granuloma at the left lung base. Chronic degenerative change of both shoulders. IMPRESSION: Lower lung volumes from prior exam with stable chronic left hemidiaphragm elevation. Stable vascular congestion.  No new abnormality is seen. Electronically Signed   By: Rubye Oaks M.D.   On: 08/19/2016 02:46        Scheduled Meds: . aspirin EC  81 mg Oral QHS  . atenolol  25 mg Oral QHS  . atorvastatin  40 mg Oral QHS  . clopidogrel  75 mg Oral QHS  . dutasteride  0.5 mg Oral QHS  . guaiFENesin  1,200 mg Oral BID  . insulin aspart  0-15 Units Subcutaneous TID WC  . insulin aspart  0-5 Units Subcutaneous QHS  . insulin glargine  10 Units Subcutaneous Daily  . ipratropium-albuterol  3 mL Nebulization QID  . ramipril  10 mg Oral QHS  . sodium chloride flush  3 mL Intravenous Q12H  . sodium chloride flush  3 mL Intravenous Q12H  . tamsulosin  0.4 mg Oral QHS   Continuous Infusions:   LOS: 8 days        [facility], MD [facility] Pager [phone]  If 7PM-7AM, please contact night-coverage [email or web address] Password [email or web address] 08/19/2016, 2:24 PM

## 2016-08-19 NOTE — Progress Notes (Signed)
SATURATION QUALIFICATIONS: (This note is used to comply with regulatory documentation for home oxygen)  Patient Saturations on Room Air at Rest = 87  Patient Saturations on Room Air while Ambulating =87  Patient Saturations on 1 Liters of oxygen while Ambulating = 95  Please briefly explain why patient needs home oxygen:

## 2016-08-19 NOTE — Progress Notes (Signed)
Pt continues to get confused and attempting to get out of bed. Continues to pull at foley and oxygen tubing. On-call paged for update. Order placed for telesitter and haldol. Will continue to monitor closely.

## 2016-08-19 NOTE — Progress Notes (Signed)
Pt complains of SOB and difficulty breathing after breathing treatment. Pt bumped up to 5L [place] with sats still sitting at 82-84%. Rapid response nurse and RT called for additional assessment. NP on-call placed orders for stat ABG and CXR. Requested to put on venturi mask at 50%. Pt refused mask placement. Sats now at 99 on 4 LNC. Pt is resting comfortably in bed. NP on-call paged with updates. Will continue to monitor.

## 2016-08-19 NOTE — Progress Notes (Signed)
Progress Note  Patient Name: Angel [name] Date of Encounter: 08/19/2016  Primary Cardiologist: Dr. Elease Hashimoto  Subjective   Pt resting comfortably in bed. He denies chest pain or shortness of breath. Family states that he is slowly improving and breathing is much better.  Inpatient Medications    Scheduled Meds: . aspirin EC  81 mg Oral QHS  . atenolol  25 mg Oral QHS  . atorvastatin  40 mg Oral QHS  . clopidogrel  75 mg Oral QHS  . dutasteride  0.5 mg Oral QHS  . guaiFENesin  1,200 mg Oral BID  . insulin aspart  0-15 Units Subcutaneous TID WC  . insulin aspart  0-5 Units Subcutaneous QHS  . insulin glargine  10 Units Subcutaneous Daily  . ipratropium-albuterol  3 mL Nebulization QID  . ramipril  10 mg Oral QHS  . sodium chloride flush  3 mL Intravenous Q12H  . sodium chloride flush  3 mL Intravenous Q12H  . tamsulosin  0.4 mg Oral QHS   Continuous Infusions:  PRN Meds: acetaminophen, albuterol, hydrALAZINE, nitroGLYCERIN, ondansetron **OR** ondansetron (ZOFRAN) IV, orphenadrine, sodium chloride flush   Vital Signs    Vitals:   08/19/16 0445 08/19/16 0541 08/19/16 0551 08/19/16 0843  BP:  (!) 110/43    Pulse:  73  80  Resp:  20  (!) 28  Temp:  97.5 F (36.4 C)    TempSrc:  Oral    SpO2: 97% 99% 98% 96%  Weight:  141 lb 5 oz (64.1 kg)    Height:        Intake/Output Summary (Last 24 hours) at 08/19/16 1025 Last data filed at 08/19/16 0815  Gross per 24 hour  Intake              480 ml  Output             2200 ml  Net            -1720 ml   Filed Weights   08/17/16 0530 08/18/16 0442 08/19/16 0541  Weight: 153 lb 10.6 oz (69.7 kg) 149 lb 0.5 oz (67.6 kg) 141 lb 5 oz (64.1 kg)    Telemetry    NSR in 70's-80's today - Personally Reviewed  ECG    08/11/16 1143: Sinus rhythm; Borderline left axis deviation  Physical Exam   General: Well developed, well nourished, male using accessory muscles to breathe, answers questions appropriately. Head:  Normocephalic, atraumatic.  Neck: Supple without bruits Lungs:  Faint expiratory wheezes, supplemental oxygen via [place] in place Heart: RRR, S1, S2, holosystolic murmur grade 3/6 on left and right upper sternal boarders Abdomen: Soft, non-tender, non-distended with normoactive bowel sounds. No hepatomegaly. No rebound/guarding. No obvious abdominal masses. Extremities: Distal pedal pulses are 1+ bilaterally. Neuro: Alert and oriented X 3. Moves all extremities spontaneously. Psych: Normal affect currently calm  Labs    Chemistry  Recent Labs Lab 08/17/16 2349 08/18/16 0336 08/19/16 0530  NA 133* 133* 131*  K 3.0* 3.0* 3.8  CL 78* 78* 84*  CO2 46* 45* 37*  GLUCOSE 112* 102* 186*  BUN 43* 42* 28*  CREATININE 0.79 0.74 0.86  CALCIUM 8.8* 8.7* 8.6*  PROT 6.1*  --   --   ALBUMIN 3.6  --   --   AST 22  --   --   ALT 21  --   --   ALKPHOS 77  --   --   BILITOT 1.2  --   --  GFRNONAA >60 >60 >60  GFRAA >60 >60 >60  ANIONGAP 9 10 10      Hematology  Recent Labs Lab 08/17/16 0345 08/17/16 2349 08/18/16 0336  WBC 20.8* 15.5* 12.3*  RBC 4.60 4.40 4.35  HGB 13.7 12.8* 12.8*  HCT 41.6 39.3 39.3  MCV 90.4 89.3 90.3  MCH 29.8 29.1 29.4  MCHC 32.9 32.6 32.6  RDW 13.2 13.0 13.2  PLT 253 232 202    Cardiac Enzymes  Recent Labs Lab 08/12/16 1247 08/17/16 1436  TROPONINI 1.11* 0.21*    No results for input(s): TROPIPOC in the last 168 hours.   BNP  Recent Labs Lab 08/16/16 0346  BNP 156.8*     DDimer No results for input(s): DDIMER in the last 168 hours.   Radiology    Dg Chest Port 1 View  Result Date: 08/19/2016 CLINICAL DATA:  Hypoxia.  Shortness of breath. EXAM: PORTABLE CHEST 1 VIEW COMPARISON:  Radiographs 08/17/2016, CT 08/11/2016 FINDINGS: Lower lung volumes from prior exam. Stable elevation of the right hemidiaphragm and rightward mediastinal shift. Unchanged mild vascular congestion. No confluent airspace disease. Calcified granuloma at the left lung  base. Chronic degenerative change of both shoulders. IMPRESSION: Lower lung volumes from prior exam with stable chronic left hemidiaphragm elevation. Stable vascular congestion.  No new abnormality is seen. Electronically Signed   By: Rubye Oaks M.D.   On: 08/19/2016 02:46    Cardiac Studies   Echocardiogram 1/223/18  Study Conclusions - Left ventricle: The cavity size was normal. Wall thickness was increased in a pattern of mild LVH. Systolic function was normal. The estimated ejection fraction was in the range of 55% to 60%.  Wall motion was normal; there were no regional wall motion abnormalities. Features are consistent with a pseudonormal left ventricular filling pattern, with concomitant abnormal relaxation and increased filling pressure (grade 2 diastolic dysfunction). - Aortic valve: Valve mobility was restricted. There was mild stenosis. - Mitral valve: Calcified annulus. - Left atrium: The atrium was moderately dilated. - Right ventricle: Systolic function was mildly reduced. - Right atrium: The atrium was mildly dilated. - Pulmonary arteries: Systolic pressure was moderately increased.  Impressions: - Normal LV systolic function; grade 2 diastolic dysfunction; calcified aortic valve with mild AS; biatrial enlargement; moderately elevated pulmonary pressure.    Left heart cath 10/22/02: CONCLUSIONS: 1. Primarily single-vessel coronary artery disease involving the left anterior descending artery and diagonal systems. 2. Successful percutaneous transluminal coronary angioplasty and stenting of the proximal and mid left anterior descending artery.  Patient Profile     81 y.o. male with a PMH significant for CAD s/p PTCA and stenting of the LAD in 2004, paralyzed left hemidiaphragm, DM, HTN, and HLD. He presented to ED on 08/11/16 with worsening dyspnea, wheezing, and cough. He was admitted for management of SOB and found to be influenza positive. His Troponin was elevated-  0.02-.045-1.56-1.11-0.21. Cardiology was consulted for elevated troponin. The plan was for OP f/u when he recovered from his URI. We signed off on the 24th but had been asked to re consult. Apparently the pt was nearing discharge but complained of chest pain and was transferred back to the ICU. This morning he was agitated and paranoid. He denies chest pain now.   Assessment & Plan    1. History of CAD s/p stenting of LAD in 2004 - Continue ASA, plavix, atenolol, statin - holding home HCTZ for marginal pressures, ramipril has been restarted and BP is stable  2. Elevated troponin this admission -  troponin trending down: pattern consistent with NSTEMI. - echo with normal LV function, without new wall motion abnormalities, grade 2 diastolic dysfunction, and mild AS - given his lack of symptoms, downward trending troponin, and normal echo in the presence of infection, recommend outpatient evaluation of ischemic disease via stress test  -Plan out patient Lexiscan Myoview.    3. COPD exacerbation secondary to influenza - duonebs, prednisone, tamiflu -Still using supplemental oxygen - BiPAP and management per primary team   4. HTN - controlled - management per primary team   5. Moderate Aortic Stenosis - noted on echo on 02/04/16 - echo 08/12/16 with mild AS-normal LVF - will follow as OP in clinic   6. DM - lisproSS, per primary team   7. BPH - continue avodart and flomax - management per primary team   Signed, Berton BonJanine Hammond , PA-C 10:25 AM 08/19/2016 Pager: [phone]  History and all data above reviewed.  Patient examined.  I agree with the findings as above.  He is confused.  The patient exam reveals COR:RRR  ,  Lungs: Decreased breath sounds  ,  Abd: Positive bowel sounds, no rebound no guarding, Ext No edema  .  All available labs, radiology testing, previous records reviewed. Agree with documented assessment and plan. CHEST PAIN:  He does not report this.   Plan is for an out patient work up.  His confusion would make any in patient work up difficult.  I do note that he had some acute SOB and drops in his sats.  Discussed with this family.  They say that his agitation precedes these events.  No change in therapy is indicated.    Fayrene FearingJames Kian Ottaviano  1:11 PM  08/19/2016

## 2016-08-19 NOTE — Care Management Note (Signed)
Case Management Note  Patient Details  Name: Angel [name] MRN: [id number] Date of Birth: August 11, 1935  Subjective/Objective:  Patient's [facility] & waiting for HH[facility],[id number] order. Noted for home 02-[facility] dme rep aware & awaiting home 02 order to deliver travel tank to rm prior d/c.                  Action/Plan:d/c home w/HHC/dme   Expected Discharge Date:   (UNKNOWN)               Expected Discharge Plan:  Home w Home Health Services  In-House Referral:     Discharge planning Services  CM Consult  Post Acute Care Choice:    Choice offered to:  Patient  DME Arranged:    DME Agency:  [facility]  HH Arranged:    HH Agency[facility]  [facility]  Status of Service:  In process, will continue to follow  If discussed at Long Length of Stay Meetings, dates discussed:    Additional Comments:  Angel [name], Angel Bisaillon, RN 08/19/2016, 12:49 PM

## 2016-08-19 NOTE — Progress Notes (Signed)
Physical Therapy Treatment Patient Details Name: Nyra MarketLuis A Dorrance MRN: [id number] DOB: 1936-01-28 Today's Date: 08/19/2016    History of Present Illness 81 yo male admitted with respiratory failure, +flu, viral bronchitis, HF. Hx of hernia, DM, CAD.     PT Comments    Progressing with mobility. Pt walked ~60 feet with a RW. O2 sats 94% on 1L O2 during ambulation. Pt tends to get anxious and RR increases. Cues for pursed lip and slow deep breathing during session. Continue to recommend 24 hour supervision/assist if pt returns home.    Follow Up Recommendations  Home health PT;Supervision/Assistance - 24 hour     Equipment Recommendations  Rolling walker with 5" wheels    Recommendations for Other Services       Precautions / Restrictions Precautions Precautions: Fall Precaution Comments: monitor O2 Restrictions Weight Bearing Restrictions: No    Mobility  Bed Mobility Overal bed mobility: Needs Assistance Bed Mobility: Sit to Supine       Sit to supine: Min guard   General bed mobility comments: close guard for lines.   Transfers Overall transfer level: Needs assistance Equipment used: None Transfers: Sit to/from Stand Sit to Stand: Min assist         General transfer comment: Assist to stabilize with initial standing from recliner without walker. Close guard for stand to sit back to bed.   Ambulation/Gait Ambulation/Gait assistance: Min guard Ambulation Distance (Feet): 60 Feet Assistive device: Rolling walker (2 wheeled)       General Gait Details: close guard for safety. O2 sats 94% on 1L O2, dyspnea 3/4. Cues for pursed lip and slow deep breathing to slow RR   Stairs            Wheelchair Mobility    Modified Rankin (Stroke Patients Only)       Balance                                    Cognition Arousal/Alertness: Awake/alert Behavior During Therapy: Anxious                   General Comments: at times, pt would  not respond to questions    Exercises      General Comments        Pertinent Vitals/Pain Pain Assessment: No/denies pain    Home Living                      Prior Function            PT Goals (current goals can now be found in the care plan section) Progress towards PT goals: Progressing toward goals    Frequency    Min 3X/week      PT Plan Current plan remains appropriate    Co-evaluation             End of Session Equipment Utilized During Treatment: Gait belt;Oxygen Activity Tolerance: Patient tolerated treatment well (still gets anxious and RR increases) Patient left: in bed;with call bell/phone within reach;with family/visitor present;with bed alarm set     Time: [id number] PT Time Calculation (min) (ACUTE ONLY): 16 min  Charges:  $Gait Training: 8-22 mins                    G Codes:      Rebeca AlertJannie Caeleigh [place], MPT Pager: [phone]

## 2016-08-19 NOTE — Care Management Note (Signed)
Case Management Note  Patient Details  Name: Nyra MarketLuis A Strieter MRN: [id number] Date of Birth: October 08, 1935  Subjective/Objective: 81 y/o m transfer from SDU-Acute resp failure. Noted qualifies for home 02 currently-if home 02 needed can arrange w/48hr prior d/c 02 sats, & home 02 order w/liter flow/freq,duration & route.PT-recc HHPT. [facility]-await final HHPT,[id number] order.                  Action/Plan:d/c plan home w/HHC/dme   Expected Discharge Date:   (UNKNOWN)               Expected Discharge Plan:  Home w Home Health Services  In-House Referral:     Discharge planning Services  CM Consult  Post Acute Care Choice:    Choice offered to:  Patient  DME Arranged:    DME Agency:     HH Arranged:    HH Agency:     Status of Service:  In process, will continue to follow  If discussed at Long Length of Stay Meetings, dates discussed:    Additional Comments:  Lanier ClamMahabir, Monterrius Cardosa, RN 08/19/2016, 11:48 AM

## 2016-08-20 ENCOUNTER — Telehealth: Payer: Self-pay | Admitting: Pulmonary Disease

## 2016-08-20 DIAGNOSIS — J1100 Influenza due to unidentified influenza virus with unspecified type of pneumonia: Secondary | ICD-10-CM

## 2016-08-20 LAB — GLUCOSE, CAPILLARY
Glucose-Capillary: 140 mg/dL — ABNORMAL HIGH (ref 65–99)
Glucose-Capillary: 127 mg/dL — ABNORMAL HIGH (ref 65–99)

## 2016-08-20 MED ORDER — GLIPIZIDE-METFORMIN HCL 5-500 MG PO TABS: 1.0000 | ORAL_TABLET | Freq: Every day | ORAL | Status: DC

## 2016-08-20 MED ORDER — ATORVASTATIN CALCIUM 40 MG PO TABS: ORAL_TABLET | ORAL | Status: DC

## 2016-08-20 MED ORDER — ALBUTEROL SULFATE HFA 108 (90 BASE) MCG/ACT IN AERS: 1.0000 | INHALATION_SPRAY | RESPIRATORY_TRACT | 0 refills | Status: DC | PRN

## 2016-08-20 MED ORDER — ATENOLOL 25 MG PO TABS: 25.0000 mg | ORAL_TABLET | Freq: Every day | ORAL | Status: DC

## 2016-08-20 MED ORDER — RAMIPRIL 10 MG PO CAPS: ORAL_CAPSULE | ORAL | Status: DC

## 2016-08-20 NOTE — Care Management Note (Signed)
Case Management Note  Patient Details  Name: Nyra MarketLuis A Kissel MRN: [id number] Date of Birth: [birth year]  Subjective/Objective:  HHRN/HHPT orders placed-[facility] rep Elnita Maxwellheryl aware of d/c.Home rw ordered-[facility] dme rep Kim aware of d/c & order, to deliver to rm prior d/c.Noted doesn't qualify for home 02. No further CM needs.                   Action/Plan:d/c home w/HHC/dme   Expected Discharge Date:  08/20/16               Expected Discharge Plan:  Home w Home Health Services  In-House Referral:     Discharge planning Services  CM Consult  Post Acute Care Choice:    Choice offered to:  Patient  DME Arranged:  Walker rolling DME Agency:  [facility]  [facility]  [facility]  Status of Service:  Completed, signed off  If discussed at Long Length of Stay Meetings, dates discussed:    Additional Comments:  Lanier ClamMahabir, Oralia Criger, RN 08/20/2016, 1:40 PM

## 2016-08-20 NOTE — Progress Notes (Signed)
Completed D/C teaching with patient and family. Discussed medications and returned medications from the pharmacy. Educated patient and family on foley care using demonstration and teach back method. Answered all questions. Patient will be D/C home with family in stable condition.

## 2016-08-20 NOTE — Telephone Encounter (Signed)
BQ  Please Advise-  Pt. Wife called in because this pt. Discharge summary states for him to follow up with you in two weeks for hospital follow up. You do not have anything available.

## 2016-08-20 NOTE — Discharge Summary (Signed)
Physician Discharge Summary  Angel [name]:[id number] DOB: 1936/07/05  PCP: Jackie Plum, MD  Admit date: 08/11/2016 Discharge date: 08/20/2016  Recommendations for Outpatient Follow-up:  1. Dr. Jackie Plum, PCP in 5 days with repeat labs (CBC & BMP). 2. Dr. Gaspar Garbe, Urology in 1 week: Follow-up regarding acute urinary retention and further management of indwelling Foley catheter 3. Dr. Max Fickle, Pulmonology in 2 weeks. May need outpatient PFTs. 4. Angel Bottoms, PA-C/Cardiology on 09/05/16 at 3:15 PM.  Home Health: PT, RN Equipment/Devices: Indwelling Foley catheter, rolling walker with 5 inch wheels.    Discharge Condition: Improved and stable.  CODE STATUS: Full.  Diet recommendation: Heart healthy and diabetic diet.  Discharge Diagnoses:  Principal Problem:   Acute respiratory failure with hypoxia (HCC) Active Problems:   CAD (coronary artery disease)   Diabetes mellitus (HCC)   HTN (hypertension)   Hyperlipidemia   COPD exacerbation (HCC)   Influenza   Hypertensive heart disease without heart failure   Demand ischemia (HCC)   S/P coronary artery stent placement   Acute urinary retention   Chest pain on breathing   Acute on chronic respiratory failure with hypoxemia (HCC)   Diaphragm paralysis   Acute encephalopathy   Influenza with pneumonia   Acute bronchitis   Brief/Interim Summary: 81 year old male with PMH of CAD status post LAD stenting, DM 2, HLD, HTN, BPH, paralyzed left diaphragm,? Asthma, lifelong nonsmoker, presented to ED on 08/11/16 with 3 days history of progressively worsening dyspnea, dry cough and wheezing. He was admitted to stepdown unit for acute hypoxic respiratory failure secondary to influenza related respiratory illness and possible asthma exacerbation. Despite aggressive treatment, continued to be hypoxic requiring high-volume oxygen and intermittent BiPAP. Pulmonology consulted on 1/26 and suspect some of this is related to  chronically paralyzed left hemidiaphragm and decompensated diastolic CHF. Improved. Telemetry transfer was planned on 1/28 but he reported worsening chest pain and dyspnea. Hypoxia has improved. Chest pain is resolved. Cardiology reconsulting. CCM has seen and cleared for transfer to medical bed. Transferring to telemetry 1/29. New onset acute confusion and ongoing urinary retention-Foley catheter reinserted.   Assessment & Plan:   1. Influenza A with acute viral bronchitis &? Asthma exacerbation: No formal diagnosis of asthma or COPD. Influenza A PCR positive and B PCR negative. Completed 5 day course of Tamiflu. Supportive treatment. No clinical bronchospasm > steroids discontinued 1/28. 2. Acute respiratory failure with hypoxia: Had required high-volume oxygen and intermittent BiPAP since admission. Initially felt to be related to acute viral bronchitis from influenza and possible asthma exacerbation and treated likewise. Despite this treatment, continued to be hypoxic and hence pulmonology was consulted on 1/26. As per pulmonology, acute hypoxic respiratory failure is multifactorial related to influenza A complicated by acute diastolic CHF, decompensated cor pulmonale with elevated PAP's in the setting of severe restrictive lung disease due to left hemidiaphragm paralysis and they doubt PE. Treated with PRN Lasix IV, continued bronchodilator nebulizations, when necessary BiPAP, IV Solu-Medrol. Repeat chest x-ray 1/24 without acute findings. Discontinued BiPAP; continue flutter valve, chest PT, discontinued Solu-Medrol, weaned oxygen. Has not required BiPAP again. Outpatient follow-up with pulmonology. Evaluated for home oxygen and not hypoxic and does not qualify. Over the last couple of days, a lot of his dyspnea and hypoxia was driven by confusion, agitation which have resolved. 3. Severe sepsis secondary to influenza: Sepsis physiology resolved. Received a dose of vancomycin and Zosyn in the ED which  were not continued due to lack of definitive source of  bacterial infection. 4. Elevated troponin/history of CAD status post LAD stenting in 2004/chest pain 1/28: Secondary to demand ischemia from acute illness. EKG without acute changes. Troponin has decreased. Briefly on IV heparin drip which was discontinued. No chest pain reported. Echo shows normal LV function without wall motion abnormalities. Cardiology consultation and follow-up appreciated: Recommending outpatient stress test & follow-up with Dr. Elease HashimotoNahser. Continue aspirin, Plavix, atenolol and statins. Patient reported chest pain again on 1/28. Troponin slightly increased to 0.21. Requested cardiology to reconsult > no change in plan for outpatient further evaluation. 5. Lactic acidosis: Secondary to acute illness. Resolved. 6. Essential hypertension: controlled. Continue prior home meds as below. HCTZ discontinued in this elderly male due to concern for hyponatremia..  7. DM 2: Mildly uncontrolled CBGs in the context of steroids. In the hospital treated with NovoLog SSI, moderate sensitivity and Lantus. A1c: 7.7. Oral hypoglycemics were held but will be resumed at discharge. 8. BPH/acute urinary retention: Continue Avodart and Fosamax. Foley catheter was inserted on 1/26 and remained in place for 48 hours. After discontinuing Foley catheter, again having issues with urinary retention and hence replaced on 1/29. Plan is to DC with Foley catheter and outpatient follow-up with urology/Dr. Annabell HowellsWrenn. Urine microscopy not suggestive of UTI. 9. Hypomagnesemia: Replaced 10. Anemia: Stable. 11. Chronic left hemidiaphragm paralysis: Management as above. Mobilize and aggressive pulmonary toilet. 12. Leukocytosis: Secondary to steroids. Improved. 13. Hypokalemia: Replace and follow. Magnesium 1.8. 14. Acute encephalopathy/delirium: Possibly Hospital/ICU delirium. Minimize opioids or psychotropic medications, reorient, family requested to meet with patient and  reassure, sleep hygiene. He developed acute confusion and paranoia on 1/28. Over the last 24 hours, family was able to be with him around the clock which definitely helped. His mental status is significantly improved and almost back to baseline.   Consultants:   Cardiology-signed off.  Pulmonology  Procedures:   2-D echo 08/12/16: Study Conclusions  - Left ventricle: The cavity size was normal. Wall thickness was increased in a pattern of mild LVH. Systolic function was normal. The estimated ejection fraction was in the range of 55% to 60%. Wall motion was normal; there were no regional wall motion abnormalities. Features are consistent with a pseudonormal left ventricular filling pattern, with concomitant abnormal relaxation and increased filling pressure (grade 2 diastolic dysfunction). - Aortic valve: Valve mobility was restricted. There was mild stenosis. - Mitral valve: Calcified annulus. - Left atrium: The atrium was moderately dilated. - Right ventricle: Systolic function was mildly reduced. - Right atrium: The atrium was mildly dilated. - Pulmonary arteries: Systolic pressure was moderately increased.  Impressions:  - Normal LV systolic function; grade 2 diastolic dysfunction; calcified aortic valve with mild AS; biatrial enlargement; moderately elevated pulmonary pressure.   BiPAP when necessary-discontinued.  Foley 1/26>   Discharge Instructions  Discharge Instructions    Call MD for:    Complete by:  As directed    Worsening confusion.   Call MD for:  difficulty breathing, headache or visual disturbances    Complete by:  As directed    Call MD for:  extreme fatigue    Complete by:  As directed    Call MD for:  persistant dizziness or light-headedness    Complete by:  As directed    Call MD for:  persistant nausea and vomiting    Complete by:  As directed    Call MD for:  redness, tenderness, or signs of infection (pain,  swelling, redness, odor or green/yellow discharge around incision site)  Complete by:  As directed    Call MD for:  severe uncontrolled pain    Complete by:  As directed    Call MD for:  temperature >100.4    Complete by:  As directed    Diet - low sodium heart healthy    Complete by:  As directed    Diet Carb Modified    Complete by:  As directed    Increase activity slowly    Complete by:  As directed        Medication List    STOP taking these medications   hydrochlorothiazide 25 MG tablet Commonly known as:  HYDRODIURIL   naproxen sodium 220 MG tablet Commonly known as:  ANAPROX     TAKE these medications   albuterol 108 (90 Base) MCG/ACT inhaler Commonly known as:  PROVENTIL HFA;VENTOLIN HFA Inhale 1-2 puffs into the lungs every 4 (four) hours as needed for wheezing or shortness of breath.   aspirin 81 MG tablet Take 81 mg by mouth at bedtime.   atenolol 25 MG tablet Commonly known as:  TENORMIN Take 1 tablet (25 mg total) by mouth at bedtime.   atorvastatin 40 MG tablet Commonly known as:  LIPITOR TAKE 40mg  TABLET BY MOUTH at night What changed:  See the new instructions.   clopidogrel 75 MG tablet Commonly known as:  PLAVIX Take 75 mg by mouth at bedtime.   dutasteride 0.5 MG capsule Commonly known as:  AVODART Take 0.5 mg by mouth at bedtime.   glipiZIDE-metformin 5-500 MG tablet Commonly known as:  METAGLIP Take 1-1.5 tablets by mouth daily. Take 1 tablet in the morning and 1 and a half at night Start taking on:  08/21/2016   nitroGLYCERIN 0.4 MG SL tablet Commonly known as:  NITROSTAT Place 1 tablet (0.4 mg total) under the tongue as needed.   orphenadrine 100 MG tablet Commonly known as:  NORFLEX Take 100 mg by mouth 2 (two) times daily as needed for muscle spasms.   ramipril 10 MG capsule Commonly known as:  ALTACE TAKE 10mg  CAPSULE BY MOUTH at night What changed:  See the new instructions.   tamsulosin 0.4 MG Caps capsule Commonly  known as:  FLOMAX Take 0.4 mg by mouth at bedtime.      Follow-up Information    Tereso Newcomer, PA-C Follow up on 09/05/2016.   Specialties:  Cardiology, Physician Assistant Why:  at 3:15 for cardiology hospital follow up. Please bring all of your medications to this appointment.  Contact information: [address] [place] [address] [phone]        Jackie Plum, MD. Schedule an appointment as soon as possible for a visit in 5 day(s).   Specialty:  Internal Medicine Why:  To be seen with repeat labs (CBC & BMP). Contact information: [address] [place] [address] [phone]        Anner Crete, MD. Schedule an appointment as soon as possible for a visit in 1 week(s).   Specialty:  Urology Why:  Follow-up regarding urinary retention and management of indwelling urinary catheter. Contact information: [address] [place] [address] [phone]        Max Fickle, MD. Schedule an appointment as soon as possible for a visit in 2 week(s).   Specialty:  Pulmonary Disease Why:  To reassess lung status and may need further testing. Contact information: [address] [place] [address] [phone]  No Known Allergies   Procedures/Studies: Ct Chest W Contrast  Result Date: 08/11/2016 CLINICAL DATA:  Marked asymmetric elevation left hemidiaphragm. EXAM: CT CHEST WITH CONTRAST TECHNIQUE: Multidetector CT imaging of the chest was performed during intravenous contrast administration. CONTRAST:  1 ISOVUE-300 IOPAMIDOL (ISOVUE-300) INJECTION 61% COMPARISON:  Chest x-ray 08/11/2016. FINDINGS: Cardiovascular: The heart size is normal. No pericardial effusion. Coronary artery calcification is noted. Atherosclerotic calcification is noted in the wall of the thoracic aorta. Mediastinum/Nodes: Scattered small mediastinal lymph nodes noted without lymphadenopathy. There is no axillary lymphadenopathy. The esophagus has  normal imaging features. There is no hilar lymphadenopathy. Lungs/Pleura: Fine detail obscured by breathing motion during image acquisition. Centrilobular and paraseptal emphysema noted bilaterally. Abdominal fat and anatomy from the left upper quadrant projects up into the lower left hemithorax. No overlying diaphragmatic tissue can be identified in this may represent a large hernia. Although images are unavailable, the report for a CT scan 2003 documented "Markedly elevated left hemidiaphragm" suggesting this is a chronic process. Anterior left lung collapse/ scarring is evident. No suspicious pulmonary nodule or mass. No pulmonary edema or pleural effusion. Upper Abdomen: Visualized portions the upper abdomen are unremarkable. Musculoskeletal: Bone windows reveal no worrisome lytic or sclerotic osseous lesions. IMPRESSION: 1. Marked volume loss in the left hemithorax way elevation of left upper quadrant abdominal contents in the lower left chest. No left hemi diaphragmatic tissue can be seen overlying the abdominal contents in this may represent a large diaphragmatic hernia or marked attenuation and elevation of the left hemidiaphragm. Similar appearance was described on the CT scan from 14 years ago suggesting chronicity. 2. Coronary artery atherosclerosis. 3.  Emphysema. ([id number]) Electronically Signed   By: Kennith Center M.D.   On: 08/11/2016 14:24   Dg Chest Port 1 View  Result Date: 08/19/2016 CLINICAL DATA:  Hypoxia.  Shortness of breath. EXAM: PORTABLE CHEST 1 VIEW COMPARISON:  Radiographs 08/17/2016, CT 08/11/2016 FINDINGS: Lower lung volumes from prior exam. Stable elevation of the right hemidiaphragm and rightward mediastinal shift. Unchanged mild vascular congestion. No confluent airspace disease. Calcified granuloma at the left lung base. Chronic degenerative change of both shoulders. IMPRESSION: Lower lung volumes from prior exam with stable chronic left hemidiaphragm elevation. Stable  vascular congestion.  No new abnormality is seen. Electronically Signed   By: Rubye Oaks M.D.   On: 08/19/2016 02:46   Dg Chest Port 1 View  Result Date: 08/17/2016 CLINICAL DATA:  Acute on chronic respiratory failure EXAM: PORTABLE CHEST 1 VIEW COMPARISON:  08/13/2016 FINDINGS: Elevated left hemidiaphragm. Cardiomegaly. Mild vascular congestion. Probable atelectasis at the right base. No visible effusions or acute bony abnormality. IMPRESSION: No significant change. Electronically Signed   By: Charlett Nose M.D.   On: 08/17/2016 09:05   Dg Chest Port 1 View  Result Date: 08/13/2016 CLINICAL DATA:  Hypoxia. EXAM: PORTABLE CHEST 1 VIEW COMPARISON:  And 08/12/2016 FINDINGS: 1837 hours. Markedly low lung volumes, as before. Elevation left hemidiaphragm with displacement of cardiomediastinal anatomy into the right hemithorax. No evidence for overt airspace pulmonary edema. Heart size is stable. The visualized bony structures of the thorax are intact. Telemetry leads overlie the chest. IMPRESSION: Stable.  No acute findings. Electronically Signed   By: Kennith Center M.D.   On: 08/13/2016 19:22   Dg Chest Port 1 View  Result Date: 08/12/2016 CLINICAL DATA:  Respiratory failure EXAM: PORTABLE CHEST 1 VIEW COMPARISON:  08/11/2016 FINDINGS: Stable significant elevation of the left hemidiaphragm. Cardiomegaly, stable no definite acute airspace opacity or  effusion. No acute bony abnormality. IMPRESSION: Stable elevation of the left hemidiaphragm and cardiomegaly. No definite acute process. Electronically Signed   By: Charlett Nose M.D.   On: 08/12/2016 11:55   Dg Chest Port 1 View  Result Date: 08/11/2016 CLINICAL DATA:  Pt states that he has been coughing and having back pain/body aches for unknown length of time Went to the doctor today and was sent here because his 02 was 83%. Continues to be hypoxic. Hx CAD, DM, htn, pna EXAM: PORTABLE CHEST 1 VIEW COMPARISON:  05/18/2006 FINDINGS: Cardiac silhouette is  partly obscured by the elevated left hemidiaphragm, as well as being deviated to the right. Cardiac silhouette appears mildly enlarged, but stable. No mediastinal or hilar masses. There is crowding of the bronchovascular structures of the left lung base above the elevated hemidiaphragm. Lungs are otherwise clear with no evidence of pneumonia. No pleural effusion or pneumothorax. Skeletal structures are intact. IMPRESSION: 1. No acute cardiopulmonary disease. 2. Significant elevation of left hemidiaphragm and mild cardiomegaly, stable from the prior study. Electronically Signed   By: Amie Portland M.D.   On: 08/11/2016 12:28      Subjective: Denies complaints. Denies cough. Mild dyspnea on exertion. No wheezing or chest pain. As per spouse at bedside, mental status has significantly improved. Both of them are eager for him to be discharged today.  Discharge Exam:  Vitals:   08/19/16 2115 08/20/16 0544 08/20/16 0747 08/20/16 1109  BP: 120/68 121/68    Pulse: 82 73 70 71  Resp: (!) 22 20 18 20   Temp: 97.9 F (36.6 C) 97.8 F (36.6 C)    TempSrc: Oral Oral    SpO2: 94% 95% 97% 96%  Weight:  65.7 kg (144 lb 13.5 oz)    Height:        General exam: Pleasant elderly male sitting up comfortably in chair this morning.  Respiratory system: clear to auscultation without wheezing or rhonchi or crackles. No increased work of breathing. Cardiovascular system: S1 & S2 heard, RRR. No JVD, murmurs, rubs, gallops or clicks. No pedal edema. Telemetry: Sinus rhythm with BBB morphology. Gastrointestinal system: Abdomen is nondistended, soft and nontender. No organomegaly or masses felt. Normal bowel sounds heard. Foley catheter> straw color urine. Central nervous system: Alert and oriented 3. No focal neurological deficits. Extremities: Symmetric 5 x 5 power. Skin: No rashes, lesions or ulcers Psychiatry: Judgement and insight impaired. Confused and paranoid have resolved.    The results of  significant diagnostics from this hospitalization (including imaging, microbiology, ancillary and laboratory) are listed below for reference.     Microbiology: Recent Results (from the past 240 hour(s))  MRSA PCR Screening     Status: None   Collection Time: 08/11/16  6:07 PM  Result Value Ref Range Status   MRSA by PCR NEGATIVE NEGATIVE Final    Comment:        The GeneXpert MRSA Assay (FDA approved for NASAL specimens only), is one component of a comprehensive MRSA colonization surveillance program. It is not intended to diagnose MRSA infection nor to guide or monitor treatment for MRSA infections.      Labs: BNP (last 3 results)  Recent Labs  08/11/16 1147 08/12/16 0637 08/16/16 0346  BNP 174.6* 403.6* 156.8*   Basic Metabolic Panel:  Recent Labs Lab 08/16/16 0346 08/17/16 0345 08/17/16 2349 08/18/16 0336 08/19/16 0530  NA 135 134* 133* 133* 131*  K 4.3 3.6 3.0* 3.0* 3.8  CL 84* 78* 78* 78* 84*  CO2 45* 44* 46* 45* 37*  GLUCOSE 192* 132* 112* 102* 186*  BUN 35* 42* 43* 42* 28*  CREATININE 0.84 0.92 0.79 0.74 0.86  CALCIUM 9.1 9.1 8.8* 8.7* 8.6*  MG  --   --   --  1.8  --    Liver Function Tests:  Recent Labs Lab 08/17/16 2349  AST 22  ALT 21  ALKPHOS 77  BILITOT 1.2  PROT 6.1*  ALBUMIN 3.6   CBC:  Recent Labs Lab 08/14/16 0340 08/17/16 0345 08/17/16 2349 08/18/16 0336  WBC 18.5* 20.8* 15.5* 12.3*  HGB 12.5* 13.7 12.8* 12.8*  HCT 39.3 41.6 39.3 39.3  MCV 94.5 90.4 89.3 90.3  PLT 216 253 232 202   Cardiac Enzymes:  Recent Labs Lab 08/17/16 1436  TROPONINI 0.21*   BNP: Invalid input(s): POCBNP CBG:  Recent Labs Lab 08/19/16 1139 08/19/16 1649 08/19/16 2110 08/20/16 0806 08/20/16 1216  GLUCAP 156* 150* 116* 140* 127*   Urinalysis    Component Value Date/Time   COLORURINE YELLOW 08/15/2016 1124   APPEARANCEUR CLEAR 08/15/2016 1124   LABSPEC 1.018 08/15/2016 1124   PHURINE 5.0 08/15/2016 1124   GLUCOSEU >=500 (A)  08/15/2016 1124   HGBUR SMALL (A) 08/15/2016 1124   BILIRUBINUR NEGATIVE 08/15/2016 1124   KETONESUR NEGATIVE 08/15/2016 1124   PROTEINUR NEGATIVE 08/15/2016 1124   NITRITE NEGATIVE 08/15/2016 1124   LEUKOCYTESUR NEGATIVE 08/15/2016 1124   Discussed in detail with patient's spouse at bedside. Updated care and answered questions.   Time coordinating discharge: Over 30 minutes  SIGNED:  Marcellus Scott, MD, FACP, FHM. [facility] Pager [phone]  If 7PM-7AM, please contact night-coverage [email or web address] Password [email or web address] 08/20/2016, 1:08 PM

## 2016-08-20 NOTE — Progress Notes (Signed)
SATURATION QUALIFICATIONS: (This note is used to comply with regulatory documentation for home oxygen)  Patient Saturations on Room Air at Rest = 96%  Patient Saturations on Room Air while Ambulating = 91%    Please briefly explain why patient needs home oxygen: Pt doesn't need home oxygen

## 2016-08-20 NOTE — Progress Notes (Signed)
Progress Note  Patient Name: Angel Angel [name] Born Date of Encounter: 08/20/2016  Primary Cardiologist: Dr. Elease HashimotoNahser  Subjective   Pt is mildly anxious, wheezing and has just had Angel [name] coughing spell. He wants to go home, does not like it here.  Inpatient Medications    Scheduled Meds: . aspirin EC  81 mg Oral QHS  . atenolol  25 mg Oral QHS  . atorvastatin  40 mg Oral QHS  . clopidogrel  75 mg Oral QHS  . dutasteride  0.5 mg Oral QHS  . guaiFENesin  1,200 mg Oral BID  . insulin aspart  0-15 Units Subcutaneous TID WC  . insulin aspart  0-5 Units Subcutaneous QHS  . insulin glargine  10 Units Subcutaneous Daily  . ipratropium-albuterol  3 mL Nebulization QID  . ramipril  10 mg Oral QHS  . sodium chloride flush  3 mL Intravenous Q12H  . sodium chloride flush  3 mL Intravenous Q12H  . tamsulosin  0.4 mg Oral QHS   Continuous Infusions:  PRN Meds: acetaminophen, albuterol, hydrALAZINE, nitroGLYCERIN, ondansetron **OR** ondansetron (ZOFRAN) IV, orphenadrine, sodium chloride flush   Vital Signs    Vitals:   08/19/16 2042 08/19/16 2115 08/20/16 0544 08/20/16 0747  BP:  120/68 121/68   Pulse:  82 73 70  Resp:  (!) 22 20 18   Temp:  97.9 F (36.6 C) 97.8 F (36.6 C)   TempSrc:  Oral Oral   SpO2: 99% 94% 95% 97%  Weight:   144 lb 13.5 oz (65.7 kg)   Height:        Intake/Output Summary (Last 24 hours) at 08/20/16 0813 Last data filed at 08/20/16 0331  Gross per 24 hour  Intake              483 ml  Output             1400 ml  Net             -917 ml   Filed Weights   08/18/16 0442 08/19/16 0541 08/20/16 0544  Weight: 149 lb 0.5 oz (67.6 kg) 141 lb 5 oz (64.1 kg) 144 lb 13.5 oz (65.7 kg)    Telemetry    NSR in 70's-80's today - Personally Reviewed  ECG    08/11/16 1143: Sinus rhythm; Borderline left axis deviation  Physical Exam   General: Well developed, well nourished, male using accessory muscles to breathe, is mildy anxious Head: Normocephalic, atraumatic.  Neck:  Supple without bruits Lungs:  Coarse wheezes, supplemental oxygen via [place] in place Heart: RRR, S1, S2, holosystolic murmur grade 3/6 on left and right upper sternal boarders Abdomen: Soft, non-tender, non-distended with normoactive bowel sounds. No hepatomegaly. No rebound/guarding. No obvious abdominal masses. Extremities: Distal pedal pulses are 1+ bilaterally. Neuro: Alert and oriented X 3. Moves all extremities spontaneously. Psych: Normal affect currently mildly anxious, wanting to go home  Labs    Chemistry  Recent Labs Lab 08/17/16 2349 08/18/16 0336 08/19/16 0530  NA 133* 133* 131*  K 3.0* 3.0* 3.8  CL 78* 78* 84*  CO2 46* 45* 37*  GLUCOSE 112* 102* 186*  BUN 43* 42* 28*  CREATININE 0.79 0.74 0.86  CALCIUM 8.8* 8.7* 8.6*  PROT 6.1*  --   --   ALBUMIN 3.6  --   --   AST 22  --   --   ALT 21  --   --   ALKPHOS 77  --   --   BILITOT 1.2  --   --  GFRNONAA >60 >60 >60  GFRAA >60 >60 >60  ANIONGAP 9 10 10      Hematology  Recent Labs Lab 08/17/16 0345 08/17/16 2349 08/18/16 0336  WBC 20.8* 15.5* 12.3*  RBC 4.60 4.40 4.35  HGB 13.7 12.8* 12.8*  HCT 41.6 39.3 39.3  MCV 90.4 89.3 90.3  MCH 29.8 29.1 29.4  MCHC 32.9 32.6 32.6  RDW 13.2 13.0 13.2  PLT 253 232 202    Cardiac Enzymes  Recent Labs Lab 08/17/16 1436  TROPONINI 0.21*    No results for input(s): TROPIPOC in the last 168 hours.   BNP  Recent Labs Lab 08/16/16 0346  BNP 156.8*     DDimer No results for input(s): DDIMER in the last 168 hours.   Radiology    Dg Chest Port 1 View  Result Date: 08/19/2016 CLINICAL DATA:  Hypoxia.  Shortness of breath. EXAM: PORTABLE CHEST 1 VIEW COMPARISON:  Radiographs 08/17/2016, CT 08/11/2016 FINDINGS: Lower lung volumes from prior exam. Stable elevation of the right hemidiaphragm and rightward mediastinal shift. Unchanged mild vascular congestion. No confluent airspace disease. Calcified granuloma at the left lung base. Chronic degenerative change of  both shoulders. IMPRESSION: Lower lung volumes from prior exam with stable chronic left hemidiaphragm elevation. Stable vascular congestion.  No new abnormality is seen. Electronically Signed   By: Rubye Oaks M.D.   On: 08/19/2016 02:46    Cardiac Studies   Echocardiogram 1/223/18  Study Conclusions - Left ventricle: The cavity size was normal. Wall thickness was increased in Angel [name] pattern of mild LVH. Systolic function was normal. The estimated ejection fraction was in the range of 55% to 60%.  Wall motion was normal; there were no regional wall motion abnormalities. Features are consistent with Angel [name] pseudonormal left ventricular filling pattern, with concomitant abnormal relaxation and increased filling pressure (grade 2 diastolic dysfunction). - Aortic valve: Valve mobility was restricted. There was mild stenosis. - Mitral valve: Calcified annulus. - Left atrium: The atrium was moderately dilated. - Right ventricle: Systolic function was mildly reduced. - Right atrium: The atrium was mildly dilated. - Pulmonary arteries: Systolic pressure was moderately increased.  Impressions: - Normal LV systolic function; grade 2 diastolic dysfunction; calcified aortic valve with mild AS; biatrial enlargement; moderately elevated pulmonary pressure.    Left heart cath 10/22/02: CONCLUSIONS: 1. Primarily single-vessel coronary artery disease involving the left anterior descending artery and diagonal systems. 2. Successful percutaneous transluminal coronary angioplasty and stenting of the proximal and mid left anterior descending artery.  Patient Profile     81 y.o. male with Angel [name] PMH significant for CAD s/p PTCA and stenting of the LAD in 2004, paralyzed left hemidiaphragm, DM, HTN, and HLD. He presented to ED on 08/11/16 with worsening dyspnea, wheezing, and cough. He was admitted for management of SOB and found to be influenza positive. His Troponin was elevated- 0.02-.045-1.56-1.11-0.21. Cardiology  was consulted for elevated troponin. The plan was for OP f/u when he recovered from his URI. We signed off on the 24th but had been asked to re consult. Apparently the pt was nearing discharge but complained of chest pain and was transferred back to the ICU. This morning he was agitated and paranoid. He denies chest pain now.   Assessment & Plan    1. History of CAD s/p stenting of LAD in 2004 - Continue ASA, plavix, atenolol, statin - holding home HCTZ for marginal pressures, ramipril has been restarted and BP is stable  2. Elevated troponin this admission - troponin trending  down: pattern consistent with NSTEMI. - echo with normal LV function, without new wall motion abnormalities, grade 2 diastolic dysfunction, and mild AS - given his lack of symptoms, downward trending troponin, and normal echo in the presence of infection, recommend outpatient evaluation of ischemic disease via stress test  -Plan out patient Lexiscan Myoview.   -VS are stable   3. COPD exacerbation secondary to influenza - duonebs, prednisone, tamiflu -Still using supplemental oxygen - BiPAP and management per primary team - Pt with poor understanding of need for hospitalization and becomes anxious which escalates his breathing difficulty. Encouraged to be up in chair for better tolerance to his situation and use IS. Wife is present.   4. HTN - controlled - management per primary team   5. Moderate Aortic Stenosis - noted on echo on 02/04/16 - echo 08/12/16 with mild AS-normal LVF - will follow as OP in clinic   6. DM - lisproSS, per primary team   7. BPH - continue avodart and flomax - management per primary team   Signed, Berton Bon , PA-C 8:13 AM 08/20/2016 Pager: [phone]

## 2016-08-20 NOTE — Discharge Instructions (Addendum)
Influenza, Adult Influenza, more commonly known as the flu, is a viral infection that primarily affects the respiratory tract. The respiratory tract includes organs that help you breathe, such as the lungs, nose, and throat. The flu causes many common cold symptoms, as well as a high fever and body aches. The flu spreads easily from person to person (is contagious). Getting a flu shot (influenza vaccination) every year is the best way to prevent influenza. What are the causes? Influenza is caused by a virus. You can catch the virus by:  Breathing in droplets from an infected person's cough or sneeze.  Touching something that was recently contaminated with the virus and then touching your mouth, nose, or eyes. What increases the risk? The following factors may make you more likely to get the flu:  Not cleaning your hands frequently with soap and water or alcohol-based hand sanitizer.  Having close contact with many people during cold and flu season.  Touching your mouth, eyes, or nose without washing or sanitizing your hands first.  Not drinking enough fluids or not eating a healthy diet.  Not getting enough sleep or exercise.  Being under a high amount of stress.  Not getting a yearly (annual) flu shot. You may be at a higher risk of complications from the flu, such as a severe lung infection (pneumonia), if you:  Are over the age of 49.  Are pregnant.  Have a weakened disease-fighting system (immune system). You may have a weakened immune system if you:  Have HIV or AIDS.  Are undergoing chemotherapy.  Aretaking medicines that reduce the activity of (suppress) the immune system.  Have a long-term (chronic) illness, such as heart disease, kidney disease, diabetes, or lung disease.  Have a liver disorder.  Are obese.  Have anemia. What are the signs or symptoms? Symptoms of this condition typically last 4-10 days and may include:  Fever.  Chills.  Headache, body  aches, or muscle aches.  Sore throat.  Cough.  Runny or congested nose.  Chest discomfort and cough.  Poor appetite.  Weakness or tiredness (fatigue).  Dizziness.  Nausea or vomiting. How is this diagnosed? This condition may be diagnosed based on your medical history and a physical exam. Your health care provider may do a nose or throat swab test to confirm the diagnosis. How is this treated? If influenza is detected early, you can be treated with antiviral medicine that can reduce the length of your illness and the severity of your symptoms. This medicine may be given by mouth (orally) or through an IV tube that is inserted in one of your veins. The goal of treatment is to relieve symptoms by taking care of yourself at home. This may include taking over-the-counter medicines, drinking plenty of fluids, and adding humidity to the air in your home. In some cases, influenza goes away on its own. Severe influenza or complications from influenza may be treated in a hospital. Follow these instructions at home:  Take over-the-counter and prescription medicines only as told by your health care provider.  Use a cool mist humidifier to add humidity to the air in your home. This can make breathing easier.  Rest as needed.  Drink enough fluid to keep your urine clear or pale yellow.  Cover your mouth and nose when you cough or sneeze.  Wash your hands with soap and water often, especially after you cough or sneeze. If soap and water are not available, use hand sanitizer.  Stay home from  work or school as told by your health care provider. Unless you are visiting your health care provider, try to avoid leaving home until your fever has been gone for 24 hours without the use of medicine.  Keep all follow-up visits as told by your health care provider. This is important. How is this prevented?  Getting an annual flu shot is the best way to avoid getting the flu. You may get the flu shot  in late summer, fall, or winter. Ask your health care provider when you should get your flu shot.  Wash your hands often or use hand sanitizer often.  Avoid contact with people who are sick during cold and flu season.  Eat a healthy diet, drink plenty of fluids, get enough sleep, and exercise regularly. Contact a health care provider if:  You develop new symptoms.  You have:  Chest pain.  Diarrhea.  A fever.  Your cough gets worse.  You produce more mucus.  You feel nauseous or you vomit. Get help right away if:  You develop shortness of breath or difficulty breathing.  Your skin or nails turn a bluish color.  You have severe pain or stiffness in your neck.  You develop a sudden headache or sudden pain in your face or ear.  You cannot stop vomiting. This information is not intended to replace advice given to you by your health care provider. Make sure you discuss any questions you have with your health care provider. Document Released: 07/04/2000 Document Revised: 12/13/2015 Document Reviewed: 05/01/2015 Elsevier Interactive Patient Education  2017 [employer]   Acute Respiratory Failure, Adult Acute respiratory failure occurs when there is not enough oxygen passing from your lungs to your body. When this happens, your lungs have trouble removing carbon dioxide from the blood. This causes your blood oxygen level to drop too low as carbon dioxide builds up. Acute respiratory failure is a medical emergency. It can develop quickly, but it is temporary if treated promptly. Your lung capacity, or how much air your lungs can hold, may improve with time, exercise, and treatment. What are the causes? There are many possible causes of acute respiratory failure, including:  Lung injury.  Chest injury or damage to the ribs or tissues near the lungs.  Lung conditions that affect the flow of air and blood into and out of the lungs, such as pneumonia, acute respiratory distress  syndrome, and cystic fibrosis.  Medical conditions, such as strokes or spinal cord injuries, that affect the muscles and nerves that control breathing.  Blood infection (sepsis).  Inflammation of the pancreas (pancreatitis).  A blood clot in the lungs (pulmonary embolism).  A large-volume blood transfusion.  Burns.  Near-drowning.  Seizure.  Smoke inhalation.  Reaction to medicines.  Alcohol or drug overdose. What increases the risk? This condition is more likely to develop in people who have:  A blocked airway.  Asthma.  A condition or disease that damages or weakens the muscles, nerves, bones, or tissues that are involved in breathing.  A serious infection.  A health problem that blocks the unconscious reflex that is involved in breathing, such as hypothyroidism or sleep apnea.  A lung injury or trauma. What are the signs or symptoms? Trouble breathing is the main symptom of acute respiratory failure. Symptoms may also include:  Rapid breathing.  Restlessness or anxiety.  Skin, lips, or fingernails that appear blue (cyanosis).  Rapid heart rate.  Abnormal heart rhythms (arrhythmias).  Confusion or changes in behavior.  Tiredness or loss of energy.  Feeling sleepy or having a loss of consciousness. How is this diagnosed? Your health care provider can diagnose acute respiratory failure with a medical history and physical exam. During the exam, your health care provider will listen to your heart and check for crackling or wheezing sounds in your lungs. Your may also have tests to confirm the diagnosis and determine what is causing respiratory failure. These tests may include:  Measuring the amount of oxygen in your blood (pulse oximetry). The measurement comes from a small device that is placed on your finger, earlobe, or toe.  Other blood tests to measure blood gases and to look for signs of infection.  Sampling your cerebral spinal fluid or tracheal  fluid to check for infections.  Chest X-ray to look for fluid in spaces that should be filled with air.  Electrocardiogram (ECG) to look at the heart's electrical activity. How is this treated? Treatment for this condition usually takes places in a hospital intensive care unit (ICU). Treatment depends on what is causing the condition. It may include one or more treatments until your symptoms improve. Treatment may include:  Supplemental oxygen. Extra oxygen is given through a tube in the nose, a face mask, or a hood.  A device such as a continuous positive airway pressure (CPAP) or bi-level positive airway pressure (BiPAP or BPAP) machine. This treatment uses mild air pressure to keep the airways open. A mask or other device will be placed over your nose or mouth. A tube that is connected to a motor will deliver oxygen through the mask.  Ventilator. This treatment helps move air into and out of the lungs. This may be done with a bag and mask or a machine. For this treatment, a tube is placed in your windpipe (trachea) so air and oxygen can flow to the lungs.  Extracorporeal membrane oxygenation (ECMO). This treatment temporarily takes over the function of the heart and lungs, supplying oxygen and removing carbon dioxide. ECMO gives the lungs a chance to recover. It may be used if a ventilator is not effective.  Tracheostomy. This is a procedure that creates a hole in the neck to insert a breathing tube.  Receiving fluids and medicines.  Rocking the bed to help breathing. Follow these instructions at home:  Take over-the-counter and prescription medicines only as told by your health care provider.  Return to normal activities as told by your health care provider. Ask your health care provider what activities are safe for you.  Keep all follow-up visits as told by your health care provider. This is important. How is this prevented? Treating infections and medical conditions that may lead  to acute respiratory failure can help prevent the condition from developing. Contact a health care provider if:  You have a fever.  Your symptoms do not improve or they get worse. Get help right away if:  You are having trouble breathing.  You lose consciousness.  Your have cyanosis or turn blue.  You develop a rapid heart rate.  You are confused. These symptoms may represent a serious problem that is an emergency. Do not wait to see if the symptoms will go away. Get medical help right away. Call your local emergency services (911 in the [place] Do not drive yourself to the hospital.  This information is not intended to replace advice given to you by your health care provider. Make sure you discuss any questions you have with your health care provider. Document Released:  07/12/2013 Document Revised: 02/02/2016 Document Reviewed: 01/23/2016 Elsevier Interactive Patient Education  2017 [employer]   Acute Urinary Retention, Male Acute urinary retention is the temporary inability to urinate. This is a common problem in older men. As men age their prostates become larger and block the flow of urine from the bladder. This is usually a problem that has come on gradually. Follow these instructions at home: If you are sent home with a Foley catheter and a drainage system, you will need to discuss the best course of action with your health care provider. While the catheter is in, maintain a good intake of fluids. Keep the drainage bag emptied and lower than your catheter. This is so that contaminated urine will not flow back into your bladder, which could lead to a urinary tract infection. There are two main types of drainage bags. One is a large bag that usually is used at night. It has a good capacity that will allow you to sleep through the night without having to empty it. The second type is called a leg bag. It has a smaller capacity, so it needs to be emptied more frequently. However, the  main advantage is that it can be attached by a leg strap and can go underneath your clothing, allowing you the freedom to move about or leave your home. Only take over-the-counter or prescription medicines for pain, discomfort, or fever as directed by your health care provider. Contact a health care provider if:  You develop a low-grade fever.  You experience spasms or leakage of urine with the spasms. Get help right away if:  You develop chills or fever.  Your catheter stops draining urine.  Your catheter falls out.  You start to develop increased bleeding that does not respond to rest and increased fluid intake. This information is not intended to replace advice given to you by your health care provider. Make sure you discuss any questions you have with your health care provider. Document Released: 10/13/2000 Document Revised: 12/19/2015 Document Reviewed: 12/16/2012 Elsevier Interactive Patient Education  2017 [employer].   Additional discharge instructions:  Please get your medications reviewed and adjusted by your Primary MD.  Please request your Primary MD to go over all Hospital Tests and Procedure/Radiological results at the follow up, please get all Hospital records sent to your Prim MD by signing hospital release before you go home.  If you had Pneumonia of Lung problems at the Hospital: Please get a 2 view Chest X ray done in 6-8 weeks after hospital discharge or sooner if instructed by your Primary MD.  If you have Congestive Heart Failure: Please call your Cardiologist or Primary MD anytime you have any of the following symptoms:  1) 3 pound weight gain in 24 hours or 5 pounds in 1 week  2) shortness of breath, with or without a dry hacking cough  3) swelling in the hands, feet or stomach  4) if you have to sleep on extra pillows at night in order to breathe  Follow cardiac low salt diet and 1.5 lit/day fluid restriction.  If you have diabetes Accuchecks 4  times/day, Once in AM empty stomach and then before each meal. Log in all results and show them to your primary doctor at your next visit. If any glucose reading is under 80 or above 300 call your primary MD immediately.  If you have Seizure/Convulsions/Epilepsy: Please do not drive, operate heavy machinery, participate in activities at heights or participate in high speed  sports until you have seen by Primary MD or a Neurologist and advised to do so again.  If you had Gastrointestinal Bleeding: Please ask your Primary MD to check a complete blood count within one week of discharge or at your next visit. Your endoscopic/colonoscopic biopsies that are pending at the time of discharge, will also need to followed by your Primary MD.  Get Medicines reviewed and adjusted. Please take all your medications with you for your next visit with your Primary MD  Please request your Primary MD to go over all hospital tests and procedure/radiological results at the follow up, please ask your Primary MD to get all Hospital records sent to his/her office.  If you experience worsening of your admission symptoms, develop shortness of breath, life threatening emergency, suicidal or homicidal thoughts you must seek medical attention immediately by calling 911 or calling your MD immediately  if symptoms less severe.  You must read complete instructions/literature along with all the possible adverse reactions/side effects for all the Medicines you take and that have been prescribed to you. Take any new Medicines after you have completely understood and accpet all the possible adverse reactions/side effects.   Do not drive or operate heavy machinery when taking Pain medications.   Do not take more than prescribed Pain, Sleep and Anxiety Medications  Special Instructions: If you have smoked or chewed Tobacco  in the last 2 yrs please stop smoking, stop any regular Alcohol  and or any Recreational drug use.  Wear  Seat belts while driving.  Please note You were cared for by a hospitalist during your hospital stay. If you have any questions about your discharge medications or the care you received while you were in the hospital after you are discharged, you can call the unit and asked to speak with the hospitalist on call if the hospitalist that took care of you is not available. Once you are discharged, your primary care physician will handle any further medical issues. Please note that NO REFILLS for any discharge medications will be authorized once you are discharged, as it is imperative that you return to your primary care physician (or establish a relationship with a primary care physician if you do not have one) for your aftercare needs so that they can reassess your need for medications and monitor your lab values.  You can reach the hospitalist office at phone [phone] or fax [phone]   If you do not have a primary care physician, you can call [phone] for a physician referral.

## 2016-08-22 NOTE — Telephone Encounter (Signed)
Spoke with pt's daughter, Arline AspCindy. Pt has been scheduled to see TP on 09/04/16 at 3pm. Nothing further was needed.

## 2016-08-25 NOTE — Telephone Encounter (Signed)
OK 

## 2016-08-26 DIAGNOSIS — R338 Other retention of urine: Secondary | ICD-10-CM | POA: Diagnosis not present

## 2016-08-26 DIAGNOSIS — R351 Nocturia: Secondary | ICD-10-CM | POA: Diagnosis not present

## 2016-08-26 DIAGNOSIS — N401 Enlarged prostate with lower urinary tract symptoms: Secondary | ICD-10-CM | POA: Diagnosis not present

## 2016-09-04 ENCOUNTER — Ambulatory Visit (INDEPENDENT_AMBULATORY_CARE_PROVIDER_SITE_OTHER): Payer: Commercial Managed Care - HMO | Admitting: Adult Health

## 2016-09-04 ENCOUNTER — Encounter: Payer: Self-pay | Admitting: Adult Health

## 2016-09-04 DIAGNOSIS — J986 Disorders of diaphragm: Secondary | ICD-10-CM | POA: Diagnosis not present

## 2016-09-04 DIAGNOSIS — J1100 Influenza due to unidentified influenza virus with unspecified type of pneumonia: Secondary | ICD-10-CM | POA: Diagnosis not present

## 2016-09-04 DIAGNOSIS — I2510 Atherosclerotic heart disease of native coronary artery without angina pectoris: Secondary | ICD-10-CM

## 2016-09-04 DIAGNOSIS — J9601 Acute respiratory failure with hypoxia: Secondary | ICD-10-CM

## 2016-09-04 DIAGNOSIS — J111 Influenza due to unidentified influenza virus with other respiratory manifestations: Secondary | ICD-10-CM | POA: Diagnosis not present

## 2016-09-04 NOTE — Progress Notes (Signed)
 @Patient  ID: Angel MarketLuis A [name], male    DOB: May 20, 1936, 81 y.o.   MRN: [id number]  Chief Complaint  Patient presents with  . Follow-up    flu     Referring provider: Jackie Plumsei-Bonsu, George, MD  HPI: 81 yo male never smoker with h/o of paralyzed HD . Seen for pulmonary consult during hospitalization for Influenza A with acute hypoxic resp failure   TEST   STUDIES:  CT Chest 1/22 >> marked volume loss in the left hemithorax, elevation of left upper quadrant abdominal contents in the lower left chest, no left hemi-diaphragmatic tissue seen overlying the abdominal contents in this may represent a large diaphragmatic hernia (similar to 14 years ago), CAD, emphysema   ECHO 1/23 - Normal LV systolic function; grade 2 diastolic dysfunction; calcified aortic valve with mild AS; biatrial enlargement; moderately elevated pulmonary pressure. - Left atrium: The atrium was moderately dilated. - Right ventricle: Systolic function was mildly reduced. - Right atrium: The atrium was mildly dilated. - Pulmonary arteries: Systolic pressure was moderately increased. CT chest 1/22: marked volume loss on left. No edema or infiltrate    09/04/2016 Follow up : Post hospital follow up  Pt returns for a 2 week follow up from recent hospitalization for respiratory distress after influenza. He was treated w/ initial BIPAP support. He was treated with Tamiflu and steroids .   he was diuresied due diastolic CHF decompensation. Echo showed gr 2 DD . EF 55%. Mod PAP . Says he is doing so much better now. Not quite back to his baseline, getting PT at home. Marland Kitchen. Appetite is good w/ no/v Prior to admission was doing well . No lung issues except for left paralyzed diaphragm .  Works Community education officerfulltime. Denies cough or wheezing .    No Known Allergies  Immunization History  Administered Date(s) Administered  . Influenza, High Dose Seasonal PF 05/21/2016  . Tdap 01/28/2015    Past Medical History:  Diagnosis Date  .  Benign prostatic hypertrophy   . Coronary artery disease    status post PTCA and stenting of the left anterior descending artery  . Diabetes mellitus   . Hyperlipidemia   . Hypertension   . Paralyzed hemidiaphragm    Left  . Pneumonia    50 years ago    Tobacco History: History  Smoking Status  . Never Smoker  Smokeless Tobacco  . Never Used   Counseling given: Not Answered   Outpatient Encounter Prescriptions as of 09/04/2016  Medication Sig  . albuterol (PROVENTIL HFA;VENTOLIN HFA) 108 (90 Base) MCG/ACT inhaler Inhale 1-2 puffs into the lungs every 4 (four) hours as needed for wheezing or shortness of breath.  Marland Kitchen. aspirin 81 MG tablet Take 81 mg by mouth at bedtime.   Marland Kitchen. atenolol (TENORMIN) 25 MG tablet Take 1 tablet (25 mg total) by mouth at bedtime.  Marland Kitchen. atorvastatin (LIPITOR) 40 MG tablet TAKE 40mg  TABLET BY MOUTH at night  . clopidogrel (PLAVIX) 75 MG tablet Take 75 mg by mouth at bedtime.  . dutasteride (AVODART) 0.5 MG capsule Take 0.5 mg by mouth at bedtime.   Marland Kitchen. glipiZIDE-metformin (METAGLIP) 5-500 MG tablet Take 1-1.5 tablets by mouth daily. Take 1 tablet in the morning and 1 and a half at night  . nitroGLYCERIN (NITROSTAT) 0.4 MG SL tablet Place 1 tablet (0.4 mg total) under the tongue as needed.  . orphenadrine (NORFLEX) 100 MG tablet Take 100 mg by mouth 2 (two) times daily as needed for muscle spasms.   .Marland Kitchen  ramipril (ALTACE) 5 MG capsule Take 5 mg by mouth daily.  . Tamsulosin HCl (FLOMAX) 0.4 MG CAPS Take 0.4 mg by mouth at bedtime.   . [DISCONTINUED] ramipril (ALTACE) 10 MG capsule TAKE 10mg  CAPSULE BY MOUTH at night (Patient not taking: Reported on 09/04/2016)   No facility-administered encounter medications on file as of 09/04/2016.      Review of Systems  Constitutional:   No  weight loss, night sweats,  Fevers, chills, fatigue, or  lassitude.  HEENT:   No headaches,  Difficulty swallowing,  Tooth/dental problems, or  Sore throat,                No sneezing,  itching, ear ache, nasal congestion, post nasal drip,   CV:  No chest pain,  Orthopnea, PND, swelling in lower extremities, anasarca, dizziness, palpitations, syncope.   GI  No heartburn, indigestion, abdominal pain, nausea, vomiting, diarrhea, change in bowel habits, loss of appetite, bloody stools.   Resp: No shortness of breath with exertion or at rest.  No excess mucus, no productive cough,  No non-productive cough,  No coughing up of blood.  No change in color of mucus.  No wheezing.  No chest wall deformity  Skin: no rash or lesions.  GU: no dysuria, change in color of urine, no urgency or frequency.  No flank pain, no hematuria   MS:  No joint pain or swelling.  No decreased range of motion.  No back pain.    Physical Exam  BP 114/62 (BP Location: Left Arm, Cuff Size: Normal)   Pulse (!) 107   Ht 5\' 6"  (1.676 m)   Wt 157 lb 9.6 oz (71.5 kg)   SpO2 96%   BMI 25.44 kg/m   GEN: A/Ox3; pleasant , NAD, elderly    HEENT:  Bond/AT,  EACs-clear, TMs-wnl, NOSE-clear, THROAT-clear, no lesions, no postnasal drip or exudate noted.   NECK:  Supple w/ fair ROM; no JVD; normal carotid impulses w/o bruits; no thyromegaly or nodules palpated; no lymphadenopathy.    RESP  Clear  P & A; w/o, wheezes/ rales/ or rhonchi. no accessory muscle use, no dullness to percussion  CARD:  RRR, no m/r/g, tr -1 + peripheral edema, pulses intact, no cyanosis or clubbing.  GI:   Soft & nt; nml bowel sounds; no organomegaly or masses detected.   Musco: Warm bil, no deformities or joint swelling noted.   Neuro: alert, no focal deficits noted.    Skin: Warm, no lesions or rashes  Psych:  No change in mood or affect. No depression or anxiety.  No memory loss.  Lab Results:  ProBNP No results found for: PROBNP  Imaging: Ct Chest W Contrast  Result Date: 08/11/2016 CLINICAL DATA:  Marked asymmetric elevation left hemidiaphragm. EXAM: CT CHEST WITH CONTRAST TECHNIQUE: Multidetector CT imaging of the  chest was performed during intravenous contrast administration. CONTRAST:  1 ISOVUE-300 IOPAMIDOL (ISOVUE-300) INJECTION 61% COMPARISON:  Chest x-ray 08/11/2016. FINDINGS: Cardiovascular: The heart size is normal. No pericardial effusion. Coronary artery calcification is noted. Atherosclerotic calcification is noted in the wall of the thoracic aorta. Mediastinum/Nodes: Scattered small mediastinal lymph nodes noted without lymphadenopathy. There is no axillary lymphadenopathy. The esophagus has normal imaging features. There is no hilar lymphadenopathy. Lungs/Pleura: Fine detail obscured by breathing motion during image acquisition. Centrilobular and paraseptal emphysema noted bilaterally. Abdominal fat and anatomy from the left upper quadrant projects up into the lower left hemithorax. No overlying diaphragmatic tissue can be identified in this may  represent a large hernia. Although images are unavailable, the report for a CT scan 2003 documented "Markedly elevated left hemidiaphragm" suggesting this is a chronic process. Anterior left lung collapse/ scarring is evident. No suspicious pulmonary nodule or mass. No pulmonary edema or pleural effusion. Upper Abdomen: Visualized portions the upper abdomen are unremarkable. Musculoskeletal: Bone windows reveal no worrisome lytic or sclerotic osseous lesions. IMPRESSION: 1. Marked volume loss in the left hemithorax way elevation of left upper quadrant abdominal contents in the lower left chest. No left hemi diaphragmatic tissue can be seen overlying the abdominal contents in this may represent a large diaphragmatic hernia or marked attenuation and elevation of the left hemidiaphragm. Similar appearance was described on the CT scan from 14 years ago suggesting chronicity. 2. Coronary artery atherosclerosis. 3.  Emphysema. ([id number]) Electronically Signed   By: Kennith Center M.D.   On: 08/11/2016 14:24   Dg Chest Port 1 View  Result Date: 08/19/2016 CLINICAL DATA:   Hypoxia.  Shortness of breath. EXAM: PORTABLE CHEST 1 VIEW COMPARISON:  Radiographs 08/17/2016, CT 08/11/2016 FINDINGS: Lower lung volumes from prior exam. Stable elevation of the right hemidiaphragm and rightward mediastinal shift. Unchanged mild vascular congestion. No confluent airspace disease. Calcified granuloma at the left lung base. Chronic degenerative change of both shoulders. IMPRESSION: Lower lung volumes from prior exam with stable chronic left hemidiaphragm elevation. Stable vascular congestion.  No new abnormality is seen. Electronically Signed   By: Rubye Oaks M.D.   On: 08/19/2016 02:46   Dg Chest Port 1 View  Result Date: 08/17/2016 CLINICAL DATA:  Acute on chronic respiratory failure EXAM: PORTABLE CHEST 1 VIEW COMPARISON:  08/13/2016 FINDINGS: Elevated left hemidiaphragm. Cardiomegaly. Mild vascular congestion. Probable atelectasis at the right base. No visible effusions or acute bony abnormality. IMPRESSION: No significant change. Electronically Signed   By: Charlett Nose M.D.   On: 08/17/2016 09:05   Dg Chest Port 1 View  Result Date: 08/13/2016 CLINICAL DATA:  Hypoxia. EXAM: PORTABLE CHEST 1 VIEW COMPARISON:  And 08/12/2016 FINDINGS: 1837 hours. Markedly low lung volumes, as before. Elevation left hemidiaphragm with displacement of cardiomediastinal anatomy into the right hemithorax. No evidence for overt airspace pulmonary edema. Heart size is stable. The visualized bony structures of the thorax are intact. Telemetry leads overlie the chest. IMPRESSION: Stable.  No acute findings. Electronically Signed   By: Kennith Center M.D.   On: 08/13/2016 19:22   Dg Chest Port 1 View  Result Date: 08/12/2016 CLINICAL DATA:  Respiratory failure EXAM: PORTABLE CHEST 1 VIEW COMPARISON:  08/11/2016 FINDINGS: Stable significant elevation of the left hemidiaphragm. Cardiomegaly, stable no definite acute airspace opacity or effusion. No acute bony abnormality. IMPRESSION: Stable elevation of  the left hemidiaphragm and cardiomegaly. No definite acute process. Electronically Signed   By: Charlett Nose M.D.   On: 08/12/2016 11:55   Dg Chest Port 1 View  Result Date: 08/11/2016 CLINICAL DATA:  Pt states that he has been coughing and having back pain/body aches for unknown length of time Went to the doctor today and was sent here because his 02 was 83%. Continues to be hypoxic. Hx CAD, DM, htn, pna EXAM: PORTABLE CHEST 1 VIEW COMPARISON:  05/18/2006 FINDINGS: Cardiac silhouette is partly obscured by the elevated left hemidiaphragm, as well as being deviated to the right. Cardiac silhouette appears mildly enlarged, but stable. No mediastinal or hilar masses. There is crowding of the bronchovascular structures of the left lung base above the elevated hemidiaphragm. Lungs are otherwise clear with  no evidence of pneumonia. No pleural effusion or pneumothorax. Skeletal structures are intact. IMPRESSION: 1. No acute cardiopulmonary disease. 2. Significant elevation of left hemidiaphragm and mild cardiomegaly, stable from the prior study. Electronically Signed   By: Amie Portland M.D.   On: 08/11/2016 12:28     Assessment & Plan:   Influenza Influenza with asthma bronchitis improved with Tamiflu, steroids and supportive care .  Appears to be clinically improving .  CXR w/ no sign of PNA    Acute respiratory failure with hypoxia (HCC) Resolved, associated with Influenza  He did not require oxygen at discharge.   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 Discussed PFT to evaluate for possible asthma or restrictive lung dz . They decline further testing at this time  . Says he is doing much better and does not need any lung tests.  Advised to follow up with our office As needed       Rubye Oaks, NP 09/04/2016

## 2016-09-04 NOTE — Assessment & Plan Note (Signed)
Resolved, associated with Influenza  He did not require oxygen at discharge.

## 2016-09-04 NOTE — Assessment & Plan Note (Signed)
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 Discussed PFT to evaluate for possible asthma or restrictive lung dz . They decline further testing at this time  . Says he is doing much better and does not need any lung tests.  Advised to follow up with our office As needed

## 2016-09-04 NOTE — Assessment & Plan Note (Signed)
Influenza with asthma bronchitis improved with Tamiflu, steroids and supportive care .  Appears to be clinically improving .  CXR w/ no sign of PNA

## 2016-09-04 NOTE — Patient Instructions (Signed)
Continue on current regimen  Follow up with Pulmonary As needed

## 2016-09-04 NOTE — Assessment & Plan Note (Deleted)
Influenza with asthma bronchitis improved with Tamiflu, steroids and supportive care .  Appears to be clinically improving .  CXR w/ no sign of PNA   

## 2016-09-05 ENCOUNTER — Ambulatory Visit: Payer: Commercial Managed Care - HMO | Admitting: Physician Assistant

## 2016-09-05 NOTE — Progress Notes (Signed)
Reviewed, agree 

## 2016-09-08 DIAGNOSIS — R338 Other retention of urine: Secondary | ICD-10-CM | POA: Diagnosis not present

## 2016-09-09 ENCOUNTER — Encounter (HOSPITAL_COMMUNITY): Payer: Self-pay | Admitting: Emergency Medicine

## 2016-09-09 ENCOUNTER — Emergency Department (HOSPITAL_COMMUNITY)
Admission: EM | Admit: 2016-09-09 | Discharge: 2016-09-10 | Disposition: A | Discharge status: Home / Self Care | Payer: Commercial Managed Care - HMO | Attending: Emergency Medicine | Admitting: Emergency Medicine

## 2016-09-09 DIAGNOSIS — R339 Retention of urine, unspecified: Secondary | ICD-10-CM | POA: Diagnosis not present

## 2016-09-09 DIAGNOSIS — I10 Essential (primary) hypertension: Secondary | ICD-10-CM | POA: Diagnosis not present

## 2016-09-09 DIAGNOSIS — Z79899 Other long term (current) drug therapy: Secondary | ICD-10-CM | POA: Diagnosis not present

## 2016-09-09 DIAGNOSIS — Z7982 Long term (current) use of aspirin: Secondary | ICD-10-CM | POA: Insufficient documentation

## 2016-09-09 DIAGNOSIS — I2510 Atherosclerotic heart disease of native coronary artery without angina pectoris: Secondary | ICD-10-CM | POA: Insufficient documentation

## 2016-09-09 DIAGNOSIS — R319 Hematuria, unspecified: Secondary | ICD-10-CM

## 2016-09-09 DIAGNOSIS — N390 Urinary tract infection, site not specified: Secondary | ICD-10-CM | POA: Diagnosis not present

## 2016-09-09 DIAGNOSIS — R3914 Feeling of incomplete bladder emptying: Secondary | ICD-10-CM | POA: Diagnosis not present

## 2016-09-09 DIAGNOSIS — E119 Type 2 diabetes mellitus without complications: Secondary | ICD-10-CM | POA: Diagnosis not present

## 2016-09-09 DIAGNOSIS — J449 Chronic obstructive pulmonary disease, unspecified: Secondary | ICD-10-CM | POA: Diagnosis not present

## 2016-09-09 LAB — URINALYSIS, ROUTINE W REFLEX MICROSCOPIC
Bilirubin Urine: NEGATIVE
Glucose, UA: >=500 mg/dL — AB
Ketones, ur: NEGATIVE mg/dL
Nitrite: NEGATIVE
Protein, ur: 100 mg/dL — AB
Specific Gravity, Urine: 1.012 (ref 1.005–1.030)
Squamous Epithelial / LPF: NONE SEEN
pH: 5 (ref 5.0–8.0)

## 2016-09-09 MED ORDER — CEPHALEXIN 500 MG PO CAPS: 500.0000 mg | ORAL_CAPSULE | Freq: Three times a day (TID) | ORAL | 0 refills | Status: AC

## 2016-09-09 MED ORDER — CEPHALEXIN 500 MG PO CAPS
500.0000 mg | ORAL_CAPSULE | Freq: Once | ORAL | Status: AC
  Administered 2016-09-09: 500 mg via ORAL
  Filled 2016-09-09: qty 1

## 2016-09-09 NOTE — ED Triage Notes (Signed)
Pt is c/o urinary retention  Pt states he had a catheter removed 2 days ago and today he has not been able to urinate since 4pm and then it was only a small amount  Pt is c/o pain in his bladder area

## 2016-09-09 NOTE — ED Provider Notes (Signed)
[facility]-EMERGENCY DEPT Provider Note   CSN: [id number] Arrival date & time: 09/09/16  1959     History   Chief Complaint Chief Complaint  Patient presents with  . Urinary Retention    HPI Angel [name] is a 81 y.o. male.  The history is provided by the patient.  Male GU Problem  Primary symptoms comment: urinary retention. This is a recurrent problem. The current episode started 12 to 24 hours ago. The problem occurs constantly. The problem has been gradually worsening. Context: foley removal 2 days ago. Associated symptoms include nausea, abdominal pain and abdominal swelling. There has been no fever. He has tried nothing for the symptoms.    Past Medical History:  Diagnosis Date  . Benign prostatic hypertrophy   . Coronary artery disease    status post PTCA and stenting of the left anterior descending artery  . Diabetes mellitus   . Hyperlipidemia   . Hypertension   . Paralyzed hemidiaphragm    Left  . Pneumonia    50 years ago    Patient Active Problem List   Diagnosis Date Noted  . Acute bronchitis   . Diaphragm paralysis   . Acute encephalopathy   . Influenza with pneumonia   . Acute urinary retention   . Chest pain on breathing   . Acute on chronic respiratory failure with hypoxemia (HCC)   . Acute respiratory failure with hypoxia (HCC) 08/12/2016  . Influenza 08/12/2016  . Hypertensive heart disease without heart failure   . Demand ischemia (HCC)   . S/P coronary artery stent placement   . COPD exacerbation (HCC) 08/11/2016  . Diabetes mellitus (HCC) 10/06/2011  . HTN (hypertension) 10/06/2011  . Hyperlipidemia 10/06/2011  . CAD (coronary artery disease) 04/07/2011    History reviewed. No pertinent surgical history.     Home Medications    Prior to Admission medications   Medication Sig Start Date End Date Taking? Authorizing Provider  albuterol (PROVENTIL HFA;VENTOLIN HFA) 108 (90 Base) MCG/ACT inhaler Inhale 1-2 puffs into the lungs every 4  (four) hours as needed for wheezing or shortness of breath. 08/20/16   Elease Etienne, MD  aspirin 81 MG tablet Take 81 mg by mouth at bedtime.     Historical Provider, MD  atenolol (TENORMIN) 25 MG tablet Take 1 tablet (25 mg total) by mouth at bedtime. 08/20/16   Elease Etienne, MD  atorvastatin (LIPITOR) 40 MG tablet TAKE 40mg  TABLET BY MOUTH at night 08/20/16   Elease Etienne, MD  clopidogrel (PLAVIX) 75 MG tablet Take 75 mg by mouth at bedtime. 07/28/16   Historical Provider, MD  dutasteride (AVODART) 0.5 MG capsule Take 0.5 mg by mouth at bedtime.     Historical Provider, MD  glipiZIDE-metformin (METAGLIP) 5-500 MG tablet Take 1-1.5 tablets by mouth daily. Take 1 tablet in the morning and 1 and a half at night 08/21/16   Elease Etienne, MD  nitroGLYCERIN (NITROSTAT) 0.4 MG SL tablet Place 1 tablet (0.4 mg total) under the tongue as needed. 07/09/12   Vesta Mixer, MD  orphenadrine (NORFLEX) 100 MG tablet Take 100 mg by mouth 2 (two) times daily as needed for muscle spasms.  11/28/13   Historical Provider, MD  ramipril (ALTACE) 5 MG capsule Take 5 mg by mouth daily.    Historical Provider, MD  Tamsulosin HCl (FLOMAX) 0.4 MG CAPS Take 0.4 mg by mouth at bedtime.     Historical Provider, MD    Family History Family History  Problem Relation Age of Onset  . Cancer Father   . Cancer Brother     Social History Social History  Substance Use Topics  . Smoking status: Never Smoker  . Smokeless tobacco: Never Used  . Alcohol use Yes     Allergies   Patient has no known allergies.   Review of Systems Review of Systems  Gastrointestinal: Positive for abdominal pain and nausea.  All other systems reviewed and are negative.    Physical Exam Updated Vital Signs BP (!) 204/185 (BP Location: Right Arm)   Pulse 115   Temp 98.2 F (36.8 C) (Oral)   Resp (!) 28   SpO2 90%   Physical Exam  Constitutional: He is oriented to person, place, and time. He appears well-developed and  well-nourished. He appears distressed.  HENT:  Head: Normocephalic and atraumatic.  Nose: Nose normal.  Eyes: Conjunctivae are normal.  Neck: Neck supple. No tracheal deviation present.  Cardiovascular: Normal rate and regular rhythm.   Pulmonary/Chest: Effort normal. No respiratory distress.  Abdominal: Soft. He exhibits no distension. There is tenderness (suprapubic with fullness).  Neurological: He is alert and oriented to person, place, and time.  Skin: Skin is warm. He is diaphoretic (and uncomfortable d/t pain).  Psychiatric: He has a normal mood and affect.     ED Treatments / Results  Labs (all labs ordered are listed, but only abnormal results are displayed) Labs Reviewed  URINALYSIS, ROUTINE W REFLEX MICROSCOPIC - Abnormal; Notable for the following:       Result Value   APPearance CLOUDY (*)    Glucose, UA >=500 (*)    Hgb urine dipstick MODERATE (*)    Protein, ur 100 (*)    Leukocytes, UA LARGE (*)    Bacteria, UA MANY (*)    All other components within normal limits  URINE CULTURE    EKG  EKG Interpretation None       Radiology No results found.  Procedures Procedures (including critical care time)  Medications Ordered in ED Medications  cephALEXin (KEFLEX) capsule 500 mg (500 mg Oral Given 09/09/16 2252)     Initial Impression / Assessment and Plan / ED Course  I have reviewed the triage vital signs and the nursing notes.  Pertinent labs & imaging results that were available during my care of the patient were reviewed by me and considered in my medical decision making (see chart for details).     81 year old male with history of BPH and previous Foley placement for acute urinary retention which was removed yesterday presents with acute urinary retention. He has not. Over the course of bands developed lower abdominal pain. Foley was placed by myself with good relief of symptoms. Urine appeared cloudy and had heavy pyuria with bacteria and  leukocytes noted so we'll cover for infection with Keflex pending culture. No evidence of developing sepsis. Symptoms adequately controlled and feels much better after Foley placement. Continue home medications and follow-up with urology for removal and reassessment at a later date.  Final Clinical Impressions(s) / ED Diagnoses   Final diagnoses:  Urinary retention  Urinary tract infection with hematuria, site unspecified    New Prescriptions Discharge Medication List as of 09/09/2016 11:46 PM    START taking these medications   Details  cephALEXin (KEFLEX) 500 MG capsule Take 1 capsule (500 mg total) by mouth 3 (three) times daily., Starting Tue 09/09/2016, Until Fri 09/19/2016, Print         Lyndal Pulleyaniel Soni Kegel, MD 09/10/16  1343  

## 2016-09-12 LAB — URINE CULTURE: Culture: >=100000 — AB

## 2016-09-13 ENCOUNTER — Telehealth: Payer: Self-pay

## 2016-09-13 NOTE — Progress Notes (Signed)
ED Antimicrobial Stewardship Positive Culture Follow Up   Angel [name] is an 81 y.o. male [facility] with a chief complaint of  Chief Complaint  Patient presents with  . Urinary Retention    Recent Results (from the past 720 hour(s))  Urine culture     Status: Abnormal   Collection Time: 09/09/16  9:31 PM  Result Value Ref Range Status   Specimen Description URINE, RANDOM  Final   Special Requests NONE  Final   Culture (A)  Final    >=100,000 COLONIES/mL ENTEROCOCCUS FAECALIS >=100,000 COLONIES/mL YEAST    Report Status 09/12/2016 FINAL  Final   Organism ID, Bacteria ENTEROCOCCUS FAECALIS (A)  Final      Susceptibility   Enterococcus faecalis - MIC*    AMPICILLIN <=2 SENSITIVE Sensitive     LEVOFLOXACIN 2 SENSITIVE Sensitive     NITROFURANTOIN <=16 SENSITIVE Sensitive     VANCOMYCIN 1 SENSITIVE Sensitive     * >=100,000 COLONIES/mL ENTEROCOCCUS FAECALIS    [x]  Treated with cephalexin, organism resistant to prescribed antimicrobial []  Patient discharged originally without antimicrobial agent and treatment is now indicated  New antibiotic prescription: Amoxicillin 500 mg po bid x 7days  ED Provider: B. Ardelle Parkran, PA-C   Malita Ignasiak, Jill Sidelison M 09/13/2016, 9:06 AM

## 2016-09-13 NOTE — Telephone Encounter (Signed)
Post ED Visit - Positive Culture Follow-up: Successful Patient Follow-Up  Culture assessed and recommendations reviewed by: []  Enzo BiNathan Batchelder, Pharm.D. []  Celedonio MiyamotoJeremy Frens, Pharm.D., BCPS []  Garvin FilaMike Maccia, Pharm.D. []  Georgina PillionElizabeth Martin, Pharm.D., BCPS []  [place] RapidsMinh Pham, [address]Pharm[address]D., BCPS, AAHIVP []  Estella HuskMichelle Turner, Pharm.D., BCPS, AAHIVP []  Tennis Mustassie Stewart, Pharm.D. []  Sherle Poeob Vincent, [address]Pharm[address] Revonda StandardAllison Masters Pharm D Positive urine culture  []  Patient discharged without antimicrobial prescription and treatment is now indicated [x]  Organism is resistant to prescribed ED discharge antimicrobial []  Patient with positive blood cultures  Changes discussed with ED provider: Fayrene HelperBowie Tran [facility] New antibiotic prescription Amoxicillin 500 mg PO BID x 7 days [facility] [place] [phone]  Contacted patient, date 09/13/16, time 0957   Angel CarasCullom, Angel [name] 09/13/2016, 9:55 AM

## 2016-09-22 DIAGNOSIS — R338 Other retention of urine: Secondary | ICD-10-CM | POA: Diagnosis not present

## 2016-10-02 ENCOUNTER — Telehealth: Payer: Self-pay | Admitting: Cardiovascular Disease

## 2016-10-02 DIAGNOSIS — R338 Other retention of urine: Secondary | ICD-10-CM | POA: Diagnosis not present

## 2016-10-02 DIAGNOSIS — N401 Enlarged prostate with lower urinary tract symptoms: Secondary | ICD-10-CM | POA: Diagnosis not present

## 2016-10-02 NOTE — Telephone Encounter (Signed)
Pt may hold Plavix for 5 days prior to surgery

## 2016-10-02 NOTE — Telephone Encounter (Signed)
New Message    Request for surgical clearance:  1. What type of surgery is being performed? Laser of the prostate   2. When is this surgery scheduled? Not yet   3. Are there any medications that need to be held prior to surgery and how long? Plavix , how long to hold and surgical clearance   4. Name of physician performing surgery? Dr Annabell HowellsWrenn  5. What is your office phone and fax number? Fax [phone]

## 2016-10-03 ENCOUNTER — Ambulatory Visit (INDEPENDENT_AMBULATORY_CARE_PROVIDER_SITE_OTHER): Payer: Commercial Managed Care - HMO | Admitting: Cardiovascular Disease

## 2016-10-03 ENCOUNTER — Encounter (INDEPENDENT_AMBULATORY_CARE_PROVIDER_SITE_OTHER): Payer: Self-pay

## 2016-10-03 ENCOUNTER — Encounter: Payer: Self-pay | Admitting: Cardiovascular Disease

## 2016-10-03 VITALS — BP 120/58 | HR 94 | Ht 66.0 in | Wt 155.4 lb

## 2016-10-03 DIAGNOSIS — I2510 Atherosclerotic heart disease of native coronary artery without angina pectoris: Secondary | ICD-10-CM | POA: Diagnosis not present

## 2016-10-03 DIAGNOSIS — I350 Nonrheumatic aortic (valve) stenosis: Secondary | ICD-10-CM | POA: Diagnosis not present

## 2016-10-03 DIAGNOSIS — I5032 Chronic diastolic (congestive) heart failure: Secondary | ICD-10-CM

## 2016-10-03 HISTORY — DX: Nonrheumatic aortic (valve) stenosis: I35.0

## 2016-10-03 HISTORY — DX: Chronic diastolic (congestive) heart failure: I50.32

## 2016-10-03 MED ORDER — FUROSEMIDE 40 MG PO TABS: 40.0000 mg | ORAL_TABLET | Freq: Every day | ORAL | 3 refills | Status: DC

## 2016-10-03 MED ORDER — POTASSIUM CHLORIDE CRYS ER 20 MEQ PO TBCR: 20.0000 meq | EXTENDED_RELEASE_TABLET | Freq: Every day | ORAL | 3 refills | Status: DC

## 2016-10-03 NOTE — Telephone Encounter (Signed)
Clearance placed in HIM to be faxed to Dr. Belva CromeWrenn's office

## 2016-10-03 NOTE — Progress Notes (Signed)
Angel MarketLuis A [name] Date of Birth  1935-09-24    [address]    [address]    [place], [place]  [id number]       Problems: 1. Coronary artery disease-status post PTCA and stenting of the LAD 2. Paralyzed left hemidiaphragm 3. Diabetes mellitus 4. Hypertension 5. Hyperlipidemia  History of Present Illness:  Angel SpeakLuis is a 81 yo with the above noted hx.  No chest pain.  He remains active .  He's not had any episodes of chest pain or shortness breath. He still exercises on an intermittent basis.  Nov 29, 2012:  Angel SpeakLuis is doing well. No CP,  Breathing is the same- slightly short of breath.  He is still working 4 days a week.  He has put in a garden this spring - has tomatoes, peppers, etc.   Nov. 10, 2014:  Angel SpeakLuis is doing well.  No angina.    Dec 05, 2013:  Angel SpeakLuis is doing well.  No angina.  Staying active.    January 09, 2015:  Doing well, no episodes of angina. He does all of his normal activities.  Still gardening .    February 04, 2016:  Still working,   Still has his garden . No CP , Breathing is stable   October 03, 2016:  Angel SpeakLuis is seen today for follow up visit .  Just getting over the flu and pneumonia. Spent 10 days in hospital.   He has a paralized left hemi - diaphragm and feels about like his normal state.   Saw pulmonary a month ago  - O2 sats were 96% at that time .   No angina .   Back doing his normal activities     Current Outpatient Prescriptions on File Prior to Visit  Medication Sig Dispense Refill  . albuterol (PROVENTIL HFA;VENTOLIN HFA) 108 (90 Base) MCG/ACT inhaler Inhale 1-2 puffs into the lungs every 4 (four) hours as needed for wheezing or shortness of breath. 1 Inhaler 0  . aspirin 81 MG tablet Take 81 mg by mouth at bedtime.     Marland Kitchen. atenolol (TENORMIN) 25 MG tablet Take 1 tablet (25 mg total) by mouth at bedtime.    Marland Kitchen. atorvastatin (LIPITOR) 40 MG tablet TAKE 40mg  TABLET BY MOUTH at night    . clopidogrel (PLAVIX) 75 MG tablet Take 75 mg by mouth at  bedtime.    . dutasteride (AVODART) 0.5 MG capsule Take 0.5 mg by mouth daily.     Marland Kitchen. glipiZIDE-metformin (METAGLIP) 5-500 MG tablet Take 1-1.5 tablets by mouth daily. Take 1 tablet in the morning and 1 and a half at night (Patient taking differently: Take 2 tablets by mouth 2 (two) times daily before a meal. Take 1 tablet in the morning and 1 and a half at night)    . nitroGLYCERIN (NITROSTAT) 0.4 MG SL tablet Place 1 tablet (0.4 mg total) under the tongue as needed. 100 tablet 3  . orphenadrine (NORFLEX) 100 MG tablet Take 100 mg by mouth daily.     . ramipril (ALTACE) 5 MG capsule Take 5 mg by mouth daily.    . Tamsulosin HCl (FLOMAX) 0.4 MG CAPS Take 0.4 mg by mouth daily.      No current facility-administered medications on file prior to visit.     No Known Allergies  Past Medical History:  Diagnosis Date  . Benign prostatic hypertrophy   . Coronary artery disease    status post PTCA and stenting of  the left anterior descending artery  . Diabetes mellitus   . Hyperlipidemia   . Hypertension   . Paralyzed hemidiaphragm    Left  . Pneumonia    50 years ago    No past surgical history on file.  History  Smoking Status  . Never Smoker  Smokeless Tobacco  . Never Used    History  Alcohol Use  . Yes    Family History  Problem Relation Age of Onset  . Cancer Father   . Cancer Brother     Reviw of Systems:  Reviewed in the HPI.  All other systems are negative.  Physical Exam: Blood pressure (!) 120/58, pulse 94, height 5\' 6"  (1.676 m), weight 155 lb 6.4 oz (70.5 kg), SpO2 (!) 89 %. General: Well developed, well nourished, in no acute distress.  Head: Normocephalic, atraumatic, sclera non-icteric, mucus membranes are moist,   Neck: Supple. Carotids are 2 + without bruits. No JVD  Lungs:  He has faint breath sounds in the left upper lung field.  No breath sounds in lower left lung field.   Right is clear  Heart: regular rate.   Hyperdynamic heart sonds.  1-2/6  systolic murmur  Abdomen: Soft, non-tender, non-distended with normal bowel sounds. No hepatomegaly. No rebound/guarding. No masses.  Msk:  Strength and tone are normal  Extremities: No clubbing or cyanosis. No edema.  Distal pedal pulses are 2+ and equal bilaterally.  Neuro: Alert and oriented X 3. Moves all extremities spontaneously.  Psych:  Responds to questions appropriately with a normal affect.  ECG:     Assessment / Plan:   1. Coronary artery disease-status post PTCA and stenting of the LAD -  patient is doing well. No changes in medications.  2. Paralyzed left hemidiaphragm 3. Diabetes mellitus 4. Hypertension - BP is well controlled. Slightly low today   5. Hyperlipidemia   - check fasting lipids today. I'll see him again in one year.  6. s/p pneumonia :  He is hypoxic in the office today  O2 sats 89% on room air.  At times 87%  Increases up to 92 with deep breaths .  He will be seeing his primary MD  Will give him lasix 40 and Kdur 20 a day  He has grade 2 diastolic dysfunction function .   7. Chronic diastolic CHF :   Will see him in 3 months    Kristeen Miss, MD  10/03/2016 10:16 AM    [facility] [address] [place],  [address] [place], [place]  [address] Pager [phone] Phone: [phone]; Fax: [phone]

## 2016-10-03 NOTE — Patient Instructions (Signed)
Medication Instructions:  START Lasix (Furosemide) 40 mg once every morning START Kdur (Potassium) 20 meq once every morning   Labwork: Your physician recommends that you return for lab work in: 3 weeks for basic metabolic panel   Testing/Procedures: None Ordered   Follow-Up: Your physician recommends that you schedule a follow-up appointment in: 3 months with Dr. Elease HashimotoNahser   If you need a refill on your cardiac medications before your next appointment, please call your pharmacy.   Thank you for choosing [facility]! Eligha BridegroomMichelle Swinyer, RN [phone]

## 2016-10-09 ENCOUNTER — Other Ambulatory Visit: Payer: Self-pay | Admitting: Urology

## 2016-10-20 ENCOUNTER — Other Ambulatory Visit: Payer: Commercial Managed Care - HMO | Admitting: *Deleted

## 2016-10-20 DIAGNOSIS — I5032 Chronic diastolic (congestive) heart failure: Secondary | ICD-10-CM

## 2016-10-20 DIAGNOSIS — I350 Nonrheumatic aortic (valve) stenosis: Secondary | ICD-10-CM

## 2016-10-20 LAB — BASIC METABOLIC PANEL
BUN/Creatinine Ratio: 27 — ABNORMAL HIGH (ref 10–24)
BUN: 27 mg/dL (ref 8–27)
CO2: 30 mmol/L — ABNORMAL HIGH (ref 18–29)
Calcium: 10.1 mg/dL (ref 8.6–10.2)
Chloride: 89 mmol/L — ABNORMAL LOW (ref 96–106)
Creatinine, Ser: 1.01 mg/dL (ref 0.76–1.27)
GFR calc Af Amer: 82 mL/min/{1.73_m2} (ref >59)
GFR calc non Af Amer: 71 mL/min/{1.73_m2} (ref >59)
Glucose: 203 mg/dL — ABNORMAL HIGH (ref 65–99)
Potassium: 4.1 mmol/L (ref 3.5–5.2)
Sodium: 138 mmol/L (ref 134–144)

## 2016-10-21 DIAGNOSIS — R300 Dysuria: Secondary | ICD-10-CM | POA: Diagnosis not present

## 2016-10-22 ENCOUNTER — Telehealth: Payer: Self-pay | Admitting: Cardiovascular Disease

## 2016-10-22 NOTE — Telephone Encounter (Signed)
New message   Pt wife is calling about lab results

## 2016-10-22 NOTE — Telephone Encounter (Signed)
Reviewed lab results with patient's wife who verbalized understanding and thanked me for the call

## 2016-10-23 NOTE — Progress Notes (Signed)
BMP results routed via epic to Dr Annabell Howells

## 2016-10-23 NOTE — Progress Notes (Signed)
Cardiology clearance Dr Elease Hashimoto 3-15- epic/on chart LOV Cardio Nahser 10-03-16 epic LOV PCP Dr Duffy Bruce 10-13-16 epic BMP 10-20-16 epic CXR 08-19-16 epic EKG 08-13-16 epic ECHO 08-12-16 epic

## 2016-10-23 NOTE — Patient Instructions (Addendum)
Angel [name]  10/23/2016   Your procedure is scheduled on: 10-30-16  Report to [facility] Main  Entrance  Take [place]  elevators to 3rd floor to  Short Stay Center at 730AM.    Call this number if you have problems the morning of surgery [phone]    Remember: ONLY 1 PERSON MAY GO WITH YOU TO SHORT STAY TO GET  READY MORNING OF YOUR SURGERY.  Do not eat food or drink liquids :After Midnight.     Take these medicines the morning of surgery with A SIP OF WATER: avodart, inhaler as needed(may bring to hospital if needed)  DO NOT TAKE ANY DIABETIC MEDICATIONS DAY OF YOUR SURGERY                               You may not have any metal on your body including hair pins and              piercings  Do not wear jewelry, make-up, lotions, powders or perfumes, deodorant              Men may shave face and neck.   Do not bring valuables to the hospital. [facility] IS NOT             RESPONSIBLE   FOR VALUABLES.  Contacts, dentures or bridgework may not be worn into surgery.       Patients discharged the day of surgery will not be allowed to drive home.  Name and phone number of your driver:  Special Instructions: N/A              Please read over the following fact sheets you were given: _____________________________________________________________________             How to Manage Your Diabetes Before and After Surgery  Why is it important to control my blood sugar before and after surgery? . Improving blood sugar levels before and after surgery helps healing and can limit problems. . A way of improving blood sugar control is eating a healthy diet by: o  Eating less sugar and carbohydrates o  Increasing activity/exercise o  Talking with your doctor about reaching your blood sugar goals . High blood sugars (greater than 180 mg/dL) can raise your risk of infections and slow your recovery, so you will need to focus on controlling your diabetes during the  weeks before surgery. . Make sure that the doctor who takes care of your diabetes knows about your planned surgery including the date and location.  How do I manage my blood sugar before surgery? . Check your blood sugar at least 4 times a day, starting 2 days before surgery, to make sure that the level is not too high or low. o Check your blood sugar the morning of your surgery when you wake up and every 2 hours until you get to the Short Stay unit. . If your blood sugar is less than 70 mg/dL, you will need to treat for low blood sugar: o Do not take insulin. o Treat a low blood sugar (less than 70 mg/dL) with  cup of clear juice (cranberry or apple), 4 glucose tablets, OR glucose gel. o Recheck blood sugar in 15 minutes after treatment (to make sure it is greater than 70 mg/dL). If your blood sugar is not greater than 70  mg/dL on recheck, call [phone] for further instructions. . Report your blood sugar to the short stay nurse when you get to Short Stay.  . If you are admitted to the hospital after surgery: o Your blood sugar will be checked by the staff and you will probably be given insulin after surgery (instead of oral diabetes medicines) to make sure you have good blood sugar levels. o The goal for blood sugar control after surgery is 80-180 mg/dL.   WHAT DO I DO ABOUT MY DIABETES MEDICATION?   . THE DAY BEFORE SURGERY 10-29-16, take  Only morning and/or lunch dose of glipizide-metformin(MetaGlip). Do not take evening dose!!     . THE MORNING OF SURGERY 10-30-16, Do not take oral diabetes medicines (pills) the morning of surgery.   Patient Signature:  Date:   Nurse Signature:  Date:   Reviewed and Endorsed by [facility] Patient Education Committee, August 2015  [facility] - Preparing for Surgery Before surgery, you can play an important role.  Because skin is not sterile, your skin needs to be as free of germs as possible.  You can reduce the number of germs on your skin  by washing with CHG (chlorahexidine gluconate) soap before surgery.  CHG is an antiseptic cleaner which kills germs and bonds with the skin to continue killing germs even after washing. Please DO NOT use if you have an allergy to CHG or antibacterial soaps.  If your skin becomes reddened/irritated stop using the CHG and inform your nurse when you arrive at Short Stay. Do not shave (including legs and underarms) for at least 48 hours prior to the first CHG shower.  You may shave your face/neck. Please follow these instructions carefully:  1.  Shower with CHG Soap the night before surgery and the  morning of Surgery.  2.  If you choose to wash your hair, wash your hair first as usual with your  normal  shampoo.  3.  After you shampoo, rinse your hair and body thoroughly to remove the  shampoo.                           4.  Use CHG as you would any other liquid soap.  You can apply chg directly  to the skin and wash                       Gently with a scrungie or clean washcloth.  5.  Apply the CHG Soap to your body ONLY FROM THE NECK DOWN.   Do not use on face/ open                           Wound or open sores. Avoid contact with eyes, ears mouth and genitals (private parts).                       Wash face,  Genitals (private parts) with your normal soap.             6.  Wash thoroughly, paying special attention to the area where your surgery  will be performed.  7.  Thoroughly rinse your body with warm water from the neck down.  8.  DO NOT shower/wash with your normal soap after using and rinsing off  the CHG Soap.  9.  Pat yourself dry with a clean towel.            10.  Wear clean pajamas.            11.  Place clean sheets on your bed the night of your first shower and do not  sleep with pets. Day of Surgery : Do not apply any lotions/deodorants the morning of surgery.  Please wear clean clothes to the hospital/surgery center.  FAILURE TO FOLLOW THESE INSTRUCTIONS MAY RESULT IN  THE CANCELLATION OF YOUR SURGERY

## 2016-10-27 ENCOUNTER — Encounter (HOSPITAL_COMMUNITY)
Admission: RE | Admit: 2016-10-27 | Discharge: 2016-10-27 | Disposition: A | Discharge status: Home / Self Care | Payer: Commercial Managed Care - HMO | Source: Ambulatory Visit | Attending: Urology | Admitting: Urology

## 2016-10-27 ENCOUNTER — Encounter (HOSPITAL_COMMUNITY): Payer: Self-pay

## 2016-10-27 DIAGNOSIS — N138 Other obstructive and reflux uropathy: Secondary | ICD-10-CM | POA: Diagnosis not present

## 2016-10-27 DIAGNOSIS — Z7902 Long term (current) use of antithrombotics/antiplatelets: Secondary | ICD-10-CM | POA: Diagnosis not present

## 2016-10-27 DIAGNOSIS — I10 Essential (primary) hypertension: Secondary | ICD-10-CM | POA: Diagnosis not present

## 2016-10-27 DIAGNOSIS — Z01810 Encounter for preprocedural cardiovascular examination: Secondary | ICD-10-CM

## 2016-10-27 DIAGNOSIS — N401 Enlarged prostate with lower urinary tract symptoms: Secondary | ICD-10-CM | POA: Diagnosis not present

## 2016-10-27 DIAGNOSIS — Z955 Presence of coronary angioplasty implant and graft: Secondary | ICD-10-CM | POA: Diagnosis not present

## 2016-10-27 DIAGNOSIS — E7800 Pure hypercholesterolemia, unspecified: Secondary | ICD-10-CM | POA: Diagnosis not present

## 2016-10-27 DIAGNOSIS — N390 Urinary tract infection, site not specified: Secondary | ICD-10-CM | POA: Diagnosis not present

## 2016-10-27 DIAGNOSIS — Z79899 Other long term (current) drug therapy: Secondary | ICD-10-CM | POA: Diagnosis not present

## 2016-10-27 DIAGNOSIS — E119 Type 2 diabetes mellitus without complications: Secondary | ICD-10-CM | POA: Diagnosis not present

## 2016-10-27 DIAGNOSIS — R338 Other retention of urine: Secondary | ICD-10-CM | POA: Diagnosis not present

## 2016-10-27 DIAGNOSIS — Z01812 Encounter for preprocedural laboratory examination: Secondary | ICD-10-CM | POA: Insufficient documentation

## 2016-10-27 DIAGNOSIS — Z7982 Long term (current) use of aspirin: Secondary | ICD-10-CM | POA: Diagnosis not present

## 2016-10-27 DIAGNOSIS — Z7984 Long term (current) use of oral hypoglycemic drugs: Secondary | ICD-10-CM | POA: Diagnosis not present

## 2016-10-27 DIAGNOSIS — I2510 Atherosclerotic heart disease of native coronary artery without angina pectoris: Secondary | ICD-10-CM | POA: Diagnosis not present

## 2016-10-27 HISTORY — DX: Dyspnea, unspecified: R06.00

## 2016-10-27 LAB — CBC
HCT: 31.7 % — ABNORMAL LOW (ref 39.0–52.0)
Hemoglobin: 10.2 g/dL — ABNORMAL LOW (ref 13.0–17.0)
MCH: 26.5 pg (ref 26.0–34.0)
MCHC: 32.2 g/dL (ref 30.0–36.0)
MCV: 82.3 fL (ref 78.0–100.0)
Platelets: 266 10*3/uL (ref 150–400)
RBC: 3.85 MIL/uL — ABNORMAL LOW (ref 4.22–5.81)
RDW: 15.2 % (ref 11.5–15.5)
WBC: 11 10*3/uL — ABNORMAL HIGH (ref 4.0–10.5)

## 2016-10-27 LAB — GLUCOSE, CAPILLARY: Glucose-Capillary: 365 mg/dL — ABNORMAL HIGH (ref 65–99)

## 2016-10-28 LAB — HEMOGLOBIN A1C
Hgb A1c MFr Bld: 7.8 % — ABNORMAL HIGH (ref 4.8–5.6)
Mean Plasma Glucose: 177 mg/dL

## 2016-10-28 NOTE — Progress Notes (Signed)
CBC, HgA1C results routed via epic to Dr Bjorn Pippin

## 2016-10-30 ENCOUNTER — Ambulatory Visit (HOSPITAL_COMMUNITY): Payer: Commercial Managed Care - HMO | Admitting: Anesthesiology

## 2016-10-30 ENCOUNTER — Encounter (HOSPITAL_COMMUNITY)
Admission: RE | Disposition: A | Discharge status: Home / Self Care | Payer: Self-pay | Source: Ambulatory Visit | Attending: Urology

## 2016-10-30 ENCOUNTER — Encounter (HOSPITAL_COMMUNITY): Payer: Self-pay | Admitting: *Deleted

## 2016-10-30 ENCOUNTER — Ambulatory Visit (HOSPITAL_COMMUNITY)
Admission: RE | Admit: 2016-10-30 | Discharge: 2016-10-30 | Disposition: A | Discharge status: Home / Self Care | Payer: Commercial Managed Care - HMO | Source: Ambulatory Visit | Attending: Urology | Admitting: Urology

## 2016-10-30 DIAGNOSIS — R338 Other retention of urine: Secondary | ICD-10-CM | POA: Diagnosis not present

## 2016-10-30 DIAGNOSIS — Z7984 Long term (current) use of oral hypoglycemic drugs: Secondary | ICD-10-CM | POA: Insufficient documentation

## 2016-10-30 DIAGNOSIS — N390 Urinary tract infection, site not specified: Secondary | ICD-10-CM | POA: Insufficient documentation

## 2016-10-30 DIAGNOSIS — Z79899 Other long term (current) drug therapy: Secondary | ICD-10-CM | POA: Insufficient documentation

## 2016-10-30 DIAGNOSIS — I2510 Atherosclerotic heart disease of native coronary artery without angina pectoris: Secondary | ICD-10-CM | POA: Insufficient documentation

## 2016-10-30 DIAGNOSIS — N138 Other obstructive and reflux uropathy: Secondary | ICD-10-CM | POA: Insufficient documentation

## 2016-10-30 DIAGNOSIS — N401 Enlarged prostate with lower urinary tract symptoms: Secondary | ICD-10-CM | POA: Diagnosis not present

## 2016-10-30 DIAGNOSIS — Z955 Presence of coronary angioplasty implant and graft: Secondary | ICD-10-CM | POA: Insufficient documentation

## 2016-10-30 DIAGNOSIS — Z7902 Long term (current) use of antithrombotics/antiplatelets: Secondary | ICD-10-CM | POA: Insufficient documentation

## 2016-10-30 DIAGNOSIS — Z7982 Long term (current) use of aspirin: Secondary | ICD-10-CM | POA: Insufficient documentation

## 2016-10-30 DIAGNOSIS — E7800 Pure hypercholesterolemia, unspecified: Secondary | ICD-10-CM | POA: Insufficient documentation

## 2016-10-30 DIAGNOSIS — E119 Type 2 diabetes mellitus without complications: Secondary | ICD-10-CM | POA: Insufficient documentation

## 2016-10-30 DIAGNOSIS — I10 Essential (primary) hypertension: Secondary | ICD-10-CM | POA: Insufficient documentation

## 2016-10-30 HISTORY — PX: THULIUM LASER TURP (TRANSURETHRAL RESECTION OF PROSTATE): SHX6744

## 2016-10-30 LAB — GLUCOSE, CAPILLARY
Glucose-Capillary: 105 mg/dL — ABNORMAL HIGH (ref 65–99)
Glucose-Capillary: 152 mg/dL — ABNORMAL HIGH (ref 65–99)

## 2016-10-30 SURGERY — THULIUM LASER TURP (TRANSURETHRAL RESECTION OF PROSTATE)
Anesthesia: General

## 2016-10-30 MED ORDER — LIDOCAINE 2% (20 MG/ML) 5 ML SYRINGE
INTRAMUSCULAR | Status: AC
  Filled 2016-10-30: qty 5

## 2016-10-30 MED ORDER — FENTANYL CITRATE (PF) 100 MCG/2ML IJ SOLN
INTRAMUSCULAR | Status: AC
  Filled 2016-10-30: qty 2

## 2016-10-30 MED ORDER — ONDANSETRON HCL 4 MG/2ML IJ SOLN
INTRAMUSCULAR | Status: DC | PRN
  Administered 2016-10-30: 4 mg via INTRAVENOUS

## 2016-10-30 MED ORDER — CEFAZOLIN SODIUM-DEXTROSE 2-4 GM/100ML-% IV SOLN
2.0000 g | INTRAVENOUS | Status: AC
  Administered 2016-10-30: 2 g via INTRAVENOUS
  Filled 2016-10-30: qty 100

## 2016-10-30 MED ORDER — MEPERIDINE HCL 50 MG/ML IJ SOLN: 6.2500 mg | INTRAMUSCULAR | Status: DC | PRN

## 2016-10-30 MED ORDER — OXYCODONE HCL 5 MG PO TABS: 5.0000 mg | ORAL_TABLET | Freq: Once | ORAL | Status: DC | PRN

## 2016-10-30 MED ORDER — ACETAMINOPHEN 325 MG PO TABS: 325.0000 mg | ORAL_TABLET | ORAL | Status: DC | PRN

## 2016-10-30 MED ORDER — PHENYLEPHRINE 40 MCG/ML (10ML) SYRINGE FOR IV PUSH (FOR BLOOD PRESSURE SUPPORT)
PREFILLED_SYRINGE | INTRAVENOUS | Status: AC
  Filled 2016-10-30: qty 10

## 2016-10-30 MED ORDER — SODIUM CHLORIDE 0.9 % IR SOLN
Status: DC | PRN
  Administered 2016-10-30: 21000 mL via INTRAVESICAL

## 2016-10-30 MED ORDER — LACTATED RINGERS IV SOLN
INTRAVENOUS | Status: DC
  Administered 2016-10-30 (×2): via INTRAVENOUS

## 2016-10-30 MED ORDER — EPHEDRINE 5 MG/ML INJ
INTRAVENOUS | Status: AC
  Filled 2016-10-30: qty 10

## 2016-10-30 MED ORDER — LIDOCAINE 2% (20 MG/ML) 5 ML SYRINGE
INTRAMUSCULAR | Status: DC | PRN
  Administered 2016-10-30: 100 mg via INTRAVENOUS

## 2016-10-30 MED ORDER — EPHEDRINE SULFATE-NACL 50-0.9 MG/10ML-% IV SOSY
PREFILLED_SYRINGE | INTRAVENOUS | Status: DC | PRN
  Administered 2016-10-30: 5 mg via INTRAVENOUS

## 2016-10-30 MED ORDER — PROPOFOL 10 MG/ML IV BOLUS
INTRAVENOUS | Status: AC
  Filled 2016-10-30: qty 20

## 2016-10-30 MED ORDER — ONDANSETRON HCL 4 MG/2ML IJ SOLN
INTRAMUSCULAR | Status: AC
  Filled 2016-10-30: qty 2

## 2016-10-30 MED ORDER — DEXAMETHASONE SODIUM PHOSPHATE 10 MG/ML IJ SOLN
INTRAMUSCULAR | Status: AC
  Filled 2016-10-30: qty 1

## 2016-10-30 MED ORDER — PROPOFOL 10 MG/ML IV BOLUS
INTRAVENOUS | Status: DC | PRN
  Administered 2016-10-30: 60 mg via INTRAVENOUS

## 2016-10-30 MED ORDER — DEXAMETHASONE SODIUM PHOSPHATE 10 MG/ML IJ SOLN
INTRAMUSCULAR | Status: DC | PRN
  Administered 2016-10-30: 10 mg via INTRAVENOUS

## 2016-10-30 MED ORDER — TRAMADOL HCL 50 MG PO TABS: 50.0000 mg | ORAL_TABLET | Freq: Four times a day (QID) | ORAL | 0 refills | Status: DC | PRN

## 2016-10-30 MED ORDER — ONDANSETRON HCL 4 MG/2ML IJ SOLN: 4.0000 mg | Freq: Once | INTRAMUSCULAR | Status: DC | PRN

## 2016-10-30 MED ORDER — FENTANYL CITRATE (PF) 100 MCG/2ML IJ SOLN
INTRAMUSCULAR | Status: DC | PRN
  Administered 2016-10-30: 25 ug via INTRAVENOUS
  Administered 2016-10-30: 50 ug via INTRAVENOUS
  Administered 2016-10-30: 25 ug via INTRAVENOUS

## 2016-10-30 MED ORDER — OXYCODONE HCL 5 MG/5ML PO SOLN: 5.0000 mg | Freq: Once | ORAL | Status: DC | PRN

## 2016-10-30 MED ORDER — ACETAMINOPHEN 160 MG/5ML PO SOLN: 325.0000 mg | ORAL | Status: DC | PRN

## 2016-10-30 MED ORDER — PHENYLEPHRINE 40 MCG/ML (10ML) SYRINGE FOR IV PUSH (FOR BLOOD PRESSURE SUPPORT)
PREFILLED_SYRINGE | INTRAVENOUS | Status: DC | PRN
  Administered 2016-10-30 (×6): 80 ug via INTRAVENOUS

## 2016-10-30 MED ORDER — FENTANYL CITRATE (PF) 100 MCG/2ML IJ SOLN: 25.0000 ug | INTRAMUSCULAR | Status: DC | PRN

## 2016-10-30 MED ORDER — CEPHALEXIN 250 MG PO CAPS: 250.0000 mg | ORAL_CAPSULE | Freq: Four times a day (QID) | ORAL | 0 refills | Status: DC

## 2016-10-30 SURGICAL SUPPLY — 15 items
BAG URINE DRAINAGE (UROLOGICAL SUPPLIES) ×2 IMPLANT
BAG URO CATCHER STRL LF (MISCELLANEOUS) ×2 IMPLANT
CATH FOLEY 2WAY SLVR 30CC 22FR (CATHETERS) ×1 IMPLANT
CATH HEMA 3WAY 30CC 22FR COUDE (CATHETERS) ×1 IMPLANT
COVER SURGICAL LIGHT HANDLE (MISCELLANEOUS) ×2 IMPLANT
GLOVE SURG SS PI 8.0 STRL IVOR (GLOVE) ×2 IMPLANT
GOWN STRL REUS W/TWL XL LVL3 (GOWN DISPOSABLE) ×2 IMPLANT
HOLDER FOLEY CATH W/STRAP (MISCELLANEOUS) ×1 IMPLANT
LASER REVOLIX PROCEDURE (MISCELLANEOUS) ×2 IMPLANT
MANIFOLD NEPTUNE II (INSTRUMENTS) ×2 IMPLANT
PACK CYSTO (CUSTOM PROCEDURE TRAY) ×2 IMPLANT
PLUG CATH AND CAP STER (CATHETERS) ×2 IMPLANT
SYR 30ML LL (SYRINGE) ×1 IMPLANT
SYRINGE IRR TOOMEY STRL 70CC (SYRINGE) ×1 IMPLANT
TUBING CONNECTING 10 (TUBING) ×4 IMPLANT

## 2016-10-30 NOTE — Anesthesia Procedure Notes (Signed)
Procedure Name: LMA Insertion Date/Time: 10/30/2016 9:55 AM Performed by: Doran Clay Pre-anesthesia Checklist: Patient identified, Emergency Drugs available, Suction available, Timeout performed and Patient being monitored Patient Re-evaluated:Patient Re-evaluated prior to inductionOxygen Delivery Method: Circle system utilized Preoxygenation: Pre-oxygenation with 100% oxygen LMA: LMA inserted LMA Size: 4.0 Placement Confirmation: positive ETCO2 and breath sounds checked- equal and bilateral Tube secured with: Tape Dental Injury: Teeth and Oropharynx as per pre-operative assessment

## 2016-10-30 NOTE — Brief Op Note (Signed)
10/30/2016  11:09 AM  PATIENT:  Angel [name]  81 y.o. male  PRE-OPERATIVE DIAGNOSIS:  BENIGN PROSTATIC HYPERPLASIA, RETENTION  POST-OPERATIVE DIAGNOSIS:  BENIGN PROSTATIC HYPERPLASIA, RETENTION  PROCEDURE:  Procedure(s): THULIUM LASER TURP (TRANSURETHRAL RESECTION OF PROSTATE) (N/A)  SURGEON:  Surgeon(s) and Role:    * Bjorn Pippin, MD - Primary  PHYSICIAN ASSISTANT:   ASSISTANTS: none   ANESTHESIA:   general  EBL:  Total I/O In: 1000 [I.V.:1000] Out: 100 [Blood:100]  BLOOD ADMINISTERED:none  DRAINS: Urinary Catheter (Foley)   LOCAL MEDICATIONS USED:  NONE  SPECIMEN:  No Specimen  DISPOSITION OF SPECIMEN:  N/A  COUNTS:  YES  TOURNIQUET:  * No tourniquets in log *  DICTATION: .Other Dictation: Dictation Number [id number]  PLAN OF CARE: Discharge to home after PACU  PATIENT DISPOSITION:  PACU - hemodynamically stable.   Delay start of Pharmacological VTE agent (>24hrs) due to surgical blood loss or risk of bleeding: no

## 2016-10-30 NOTE — Anesthesia Postprocedure Evaluation (Addendum)
Anesthesia Post Note  Patient: Angel [name]  Procedure(s) Performed: Procedure(s) (LRB): THULIUM LASER TURP (TRANSURETHRAL RESECTION OF PROSTATE) (N/A)  Patient location during evaluation: PACU Anesthesia Type: General Level of consciousness: awake Pain management: pain level controlled Vital Signs Assessment: post-procedure vital signs reviewed and stable Respiratory status: spontaneous breathing Cardiovascular status: stable Postop Assessment: no signs of nausea or vomiting Anesthetic complications: no        Last Vitals:  Vitals:   10/30/16 1130 10/30/16 1145  BP: 118/62 121/62  Pulse: 84 83  Resp: 18 16  Temp:      Last Pain:  Vitals:   10/30/16 1145  TempSrc:   PainSc: 0-No pain   Pain Goal: Patients Stated Pain Goal: 3 (10/30/16 0918)               Laurey Salser JR,JOHN Susann Givens

## 2016-10-30 NOTE — Transfer of Care (Signed)
Immediate Anesthesia Transfer of Care Note  Patient: Angel [name]  Procedure(s) Performed: Procedure(s): THULIUM LASER TURP (TRANSURETHRAL RESECTION OF PROSTATE) (N/A)  Patient Location: PACU  Anesthesia Type:General  Level of Consciousness: sedated  Airway & Oxygen Therapy: Patient Spontanous Breathing and Patient connected to face mask oxygen  Post-op Assessment: Report given to RN and Post -op Vital signs reviewed and stable  Post vital signs: Reviewed and stable  Last Vitals:  Vitals:   10/30/16 0705  BP: (!) 100/59  Pulse: 95  Resp: 18  Temp: 36.6 C    Last Pain:  Vitals:   10/30/16 0705  TempSrc: Oral      Patients Stated Pain Goal: 3 (10/30/16 0918)  Complications: No apparent anesthesia complications

## 2016-10-30 NOTE — Discharge Instructions (Signed)
Prostate Laser Surgery, Care After This sheet gives you information about how to care for yourself after your procedure. Your health care provider may also give you more specific instructions. If you have problems or questions, contact your health care provider. What can I expect after the procedure? For the first few weeks after the procedure:  You will feel a need to urinate often.  You may have blood in your urine.  You may feel a sudden need to urinate. Once your urinary catheter is removed, you may have a burning feeling when you urinate, especially at the end of urination. This feeling usually passes within 3-5 days. Follow these instructions at home: Activity   Return to your normal activities as told by your health care provider. Ask your health care provider what activities are safe for you.  Do not do vigorous exercise for 1 week or as told by your health care provider.  Do not lift anything that is heavier than 10 lb (4.5 kg) until your health care provider say it is safe.  Avoid sexual activity for 4-6 weeks or as told by your health care provider.  Do not ride in a car for extended periods of time for 1 month or as told by your health care provider.  Do not drive for 24 hours if you were given a medicine to help you relax (sedative). Diet   Eat foods that are high in fiber, such as fresh fruits and vegetables, whole grains, and beans.  Drink enough fluid to keep your urine clear or pale yellow. Medicines   Take over-the-counter and prescription medicines, including stool softeners, only as told by your health care provider.  If you were prescribed an antibiotic medicine, take it as told by your health care provider. Do not stop taking the antibiotic even if you start to feel better. General instructions   If you were given elastic support stockings, wear them as told by your health care provider.  Do not strain to have a bowel movement. Straining may lead to  bleeding from the prostate and cause clots to form and cause trouble urinating.  Keep all follow-up visits as told by your health care provider. This is important. Contact a health care provider if:  You have a fever or chills.  You have spasms or pain with the urinary catheter still in place.  Once the catheter has been removed, you experience difficulty starting your stream when attempting to urinate. Get help right away if:  There is a blockage in your catheter.  Your catheter has been removed and you are suddenly unable to urinate.  Your urine smells unusually bad.  You start to have blood clots in your urine.  The blood in your urine becomes persistent or gets thick.  You develop chest pains.  You develop shortness of breath.  You develop swelling or pain in your leg. Summary  You may notice urinary symptoms for a few weeks after your procedure.  Follow instructions from your health care provider regarding activity restrictions such as lifting, exercise, and sexual activity.  Contact your health care provider if you have any unusual symptoms during your recovery.  You may remove your catheter on Friday morning and if you don't feel comfortable doing that you may come to the office in the morning to have that done.   Once it is removed, if you have been unable to urinate in 4 hours, please contact the office to be seen that day.   This  information is not intended to replace advice given to you by your health care provider. Make sure you discuss any questions you have with your health care provider. Document Released: 07/07/2005 Document Revised: 02/22/2016 Document Reviewed: 02/22/2016 Elsevier Interactive Patient Education  2017 [employer].

## 2016-10-30 NOTE — Anesthesia Preprocedure Evaluation (Addendum)
Anesthesia Evaluation  Patient identified by MRN, date of birth, ID band Patient awake    Reviewed: Allergy & Precautions, NPO status , Patient's Chart, lab work & pertinent test results  Airway Mallampati: I       Dental no notable dental hx.    Pulmonary    Pulmonary exam normal        Cardiovascular hypertension, Pt. on medications + CAD and + Cardiac Stents  Normal cardiovascular exam     Neuro/Psych    GI/Hepatic negative GI ROS, Neg liver ROS,   Endo/Other  diabetes, Oral Hypoglycemic Agents  Renal/GU negative Renal ROS     Musculoskeletal negative musculoskeletal ROS (+)   Abdominal Normal abdominal exam  (+)   Peds  Hematology  (+) Blood dyscrasia, anemia ,   Anesthesia Other Findings Transthoracic Echocardiography  Patient:    Angel [name], Angel [name] MR #:       [id number] Study Date: 08/12/2016 Gender:     M Age:        81 Height:     165.1 cm Weight:     73.2 kg BSA:        1.85 m^2 Pt. Status: Room:       [address], [place] [id number]  [id number]    [place], [place] [address]  ATTENDING    Loren Racer [id number]  SONOGRAPHER  Arvil Chaco  PERFORMING   [facility], Inpatient  ADMITTING    Arrien, York Ram  cc:  ------------------------------------------------------------------- LV EF: 55% -   60%  ------------------------------------------------------------------- Indications:      Abnormal EKG 794.31.  ------------------------------------------------------------------- History:   PMH:  Influenza A.  ------------------------------------------------------------------- Study Conclusions  - Left ventricle: The cavity size was normal. Wall thickness was   increased in a pattern of mild LVH. Systolic function was normal.   The estimated ejection fraction was in the range of 55% to 60%.   Wall motion was normal; there were no regional wall motion   abnormalities. Features  are consistent with a pseudonormal left   ventricular filling pattern, with concomitant abnormal relaxation   and increased filling pressure (grade 2 diastolic dysfunction). - Aortic valve: Valve mobility was restricted. There was mild   stenosis. - Mitral valve: Calcified annulus. - Left atrium: The atrium was moderately dilated. - Right ventricle: Systolic function was mildly reduced. - Right atrium: The atrium was mildly dilated. - Pulmonary arteries: Systolic pressure was moderately increased.  Impressions:  - Normal LV systolic function; grade 2 diastolic dysfunction;   calcified aortic valve with mild AS; biatrial enlargement;   moderately elevated pulmonary pressure.  ------------------------------------------------------------------- Study data:  Comparison was made to the study of 02/18/2016.  Study status:  Routine.  Procedure:  The study was technically difficult due to the heart being on the right side of the patient&'s sternum, Ventilation in use and restricted mobility. The patient reported no pain pre or post test. Transthoracic echocardiography. Image quality was adequate.  Study completion:  There were no complications.          Transthoracic echocardiography.  M-mode, complete 2D, spectral Doppler, and color Doppler.  Birthdate: Patient birthdate: 07-Sep-1935.  Age:  Patient is 81 yr old.  Sex: Gender: male.    BMI: 26.9 kg/m^2.  Blood pressure:     103/44 Patient status:  Inpatient.  Study date:  Study date: 08/12/2016. Study time: 02:49 PM.  Location:  ICU/CCU  -------------------------------------------------------------------  ------------------------------------------------------------------- Left ventricle:  The cavity size was normal.  Wall thickness was increased in a pattern of mild LVH. Systolic function was normal. The estimated ejection fraction was in the range of 55% to 60%. Wall motion was normal; there were no regional wall  motion abnormalities. Features are consistent with a pseudonormal left ventricular filling pattern, with concomitant abnormal relaxation and increased filling pressure (grade 2 diastolic dysfunction).  ------------------------------------------------------------------- Aortic valve:   Trileaflet; moderately calcified leaflets. Valve mobility was restricted.  Doppler:   There was mild stenosis. There was no regurgitation.    VTI ratio of LVOT to aortic valve: 0.51. Indexed valve area (VTI): 0.88 cm^2/m^2. Peak velocity ratio of LVOT to aortic valve: 0.54. Indexed valve area (Vmax): 0.91 cm^2/m^2. Mean velocity ratio of LVOT to aortic valve: 0.47. Indexed valve area (Vmean): 0.79 cm^2/m^2.    Mean gradient (S): 10 mm Hg. Peak gradient (S): 18 mm Hg.  ------------------------------------------------------------------- Aorta:  Aortic root: The aortic root was normal in size.  ------------------------------------------------------------------- Mitral valve:   Calcified annulus. Mobility was not restricted. Doppler:  Transvalvular velocity was within the normal range. There was no evidence for stenosis. There was trivial regurgitation. Peak gradient (D): 3 mm Hg.  ------------------------------------------------------------------- Left atrium:  The atrium was moderately dilated.  -------------------------------------------------------------------  Reproductive/Obstetrics                           Anesthesia Physical Anesthesia Plan  ASA: II  Anesthesia Plan: General   Post-op Pain Management:    Induction: Intravenous  Airway Management Planned: LMA  Additional Equipment:   Intra-op Plan:   Post-operative Plan:   Informed Consent: I have reviewed the patients History and Physical, chart, labs and discussed the procedure including the risks, benefits and alternatives for the proposed anesthesia with the patient or authorized representative who has  indicated his/her understanding and acceptance.   Dental advisory given  Plan Discussed with:   Anesthesia Plan Comments:         Anesthesia Quick Evaluation

## 2016-10-30 NOTE — H&P (Signed)
CC: I have urinary retention.  HPI: Angel [name] is a 81 year-old male established patient who is here for urinary retention.    Angel [name] returns today for cystoscopy for further evaluation of his urinary retention.   Angel [name] had urodynamix and was found ot have a max capacity of approx. 525 mls. He expressed his 1st sensation at 364 mls. There was positive low amplitude instability. He felt an increased urge to void at the time but was able to inhibit leaking. He generated a voluntary contraction but was not able to void during the study. He tried to void while seated and while standing. He felt discomfort in his lower abdomen at max capacity. Trabeculation and some elevation of the bladder base were noted. Bilateral reflux was noted.   He is currently on septra for a UTI.   He has been on tamsulosin and dutasteride for years for BPH with BOO and has prior Urinary retention. This recent bout occurred after a hospitalization for respiratory failure from the flu.     ALLERGIES: No Allergies    MEDICATIONS: Bactrim Ds 800 mg-160 mg tablet 1 tablet PO BID  Aleve 220 MG Oral Capsule Oral  Aspirin 81 mg tablet, chewable Oral  Atenolol 25 mg tablet Oral  Atorvastatin Calcium 40 mg tablet Oral  Dutasteride 0.5 mg capsule 0 Oral  Glipizide Er 10 mg tablet, extended release 24 hr Oral  Meloxicam 7.5 mg tablet Oral  Metformin Hcl 850 mg tablet Oral  Orphenadrine Citrate 100 mg tablet, extended release Oral  Plavix 75 mg tablet Oral  Proventil Hfa 90 mcg hfa aerosol with adapter Inhalation  Tamsulosin HCl - 0.4 MG Oral Capsule 0 Oral     GU PSH: Complex cystometrogram, w/ void pressure and urethral pressure profile studies, any technique - 09/22/2016 Complex Uroflow - 09/22/2016 Emg surf Electrd - 09/22/2016 Inject For cystogram - 09/22/2016 Intrabd voidng Press - 09/22/2016      PSH Notes: Cath Stent Placement, Back Surgery   NON-GU PSH: None   GU PMH: Incomplete bladder emptying -  09/09/2016 Urinary Retention - 08/26/2016 BPH w/LUTS, Benign prostatic hyperplasia with urinary obstruction - 11/12/2015 Elevated PSA, Elevated prostate specific antigen (PSA) - 11/12/2015 Nocturia, Nocturia - 11/12/2015      PMH Notes:  2006-07-27 10:58:39 - Note: Coronary Artery Disease   NON-GU PMH: Encounter for general adult medical examination without abnormal findings, Encounter for preventive health examination - 11/12/2015 Glycosuria, Glycosuria - 10/30/2014 Personal history of other diseases of the circulatory system, History of hypertension - 2014 Personal history of other endocrine, nutritional and metabolic disease, History of diabetes mellitus - 2014, History of diabetes mellitus, - 2014, History of hypercholesterolemia, - 2014    FAMILY HISTORY: Family Health Status Number - Runs In Family No pertinent family history - Other   SOCIAL HISTORY: Marital Status: Married     Notes: Never a smoker, Alcohol Use, Marital History - Currently Married, Death In The Family Mother, Tobacco Use, Death In The Family Father, Occupation:   REVIEW OF SYSTEMS:    GU Review Male:   Patient denies frequent urination, hard to postpone urination, burning/ pain with urination, get up at night to urinate, leakage of urine, stream starts and stops, trouble starting your stream, have to strain to urinate , erection problems, and penile pain.  Gastrointestinal (Upper):   Patient denies nausea, vomiting, and indigestion/ heartburn.  Gastrointestinal (Lower):   Patient denies diarrhea and constipation.  Constitutional:   Patient denies fever, night sweats,  weight loss, and fatigue.  Skin:   Patient denies skin rash/ lesion and itching.  Eyes:   Patient denies blurred vision and double vision.  Ears/ Nose/ Throat:   Patient denies sore throat and sinus problems.  Hematologic/Lymphatic:   Patient denies swollen glands and easy bruising.  Cardiovascular:   Patient denies leg swelling and chest pains.   Respiratory:   chronic. Patient reports shortness of breath. Patient denies cough.  Endocrine:   Patient denies excessive thirst.  Musculoskeletal:   Patient denies back pain and joint pain.  Neurological:   Patient denies headaches and dizziness.  Psychologic:   Patient denies depression and anxiety.   VITAL SIGNS:      10/02/2016 01:19 PM  BP 117/65 mmHg  Pulse 99 /min  Temperature 96.9 F / 36 C   MULTI-SYSTEM PHYSICAL EXAMINATION:    Constitutional: Well-nourished. No physical deformities. Normally developed. Good grooming.  Respiratory: some tachypnea. CTA on right, decreased BS on left.   Cardiovascular: Normal temperature, RRR with murmur.     PAST DATA REVIEWED:  Source Of History:  Patient  Urine Test Review:   Urinalysis, Urine Culture and Sensitivity  Urodynamics Review:   Review Urodynamics Tests   11/06/15 10/09/14 10/03/13 10/04/12 10/06/11 08/02/10 08/03/09 01/26/09  PSA  Total PSA 1.91  2.14  1.90  2.04  1.95  1.93  2.41  2.55     PROCEDURES:         Flexible Cystoscopy - 52000  Risks, benefits, and some of the potential complications of the procedure were discussed. He is on bactrim   Meatus:  Normal size. Normal location. Normal condition.  Urethra:  No strictures.  External Sphincter:  Normal.  Verumontanum:  Normal.  Prostate:  Obstructing. Enlarged median lobe. Moderate hyperplasia. about 3cm  Bladder Neck:  Non-obstructing.  Ureteral Orifices:  Normal location. Normal size. Normal shape. Effluxed clear urine.  Bladder:  Moderate trabeculation. Erythematous mucosa with edema on the posterior and lateral walls from the foley. No tumors. No stones.      The procedure was well tolerated and there were no complications.  He failed a voiding trial post cystoscopy and a 16 fr foley was reinserted.           PVR Ultrasound - 16109  Scanned Volume: 446 cc         Urinalysis w/Scope Dipstick Dipstick Cont'd Micro  Color: Yellow Bilirubin: Neg WBC/hpf:  >60/hpf  Appearance: Cloudy Ketones: Neg RBC/hpf: 10 - 20/hpf  Specific Gravity: 1.020 Blood: 3+ Bacteria: Few (10-25/hpf)  pH: 6.0 Protein: 1+ Cystals: NS (Not Seen)  Glucose: Neg Urobilinogen: 0.2 Casts: NS (Not Seen)    Nitrites: Neg Trichomonas: Not Present    Leukocyte Esterase: 3+ Mucous: Not Present      Epithelial Cells: NS (Not Seen)      Yeast: Many (>10/hpf)      Sperm: Not Present    ASSESSMENT:      ICD-10 Details  1 GU:   Urinary Retention - R33.8 He failed a voiding trial today and has trilobar hyperplasia with a ball valving middle lobe. I am going to get him set up for a Thulium laser prostatectomy and reviewed the risks in detail. I will have him cleared by Dr. Elease Hashimoto to see if he can come off of the plavix but I think we can still do it on Plavix if needed.   2   BPH w/LUTS - N40.1    PLAN:  Schedule Return Visit/Planned Activity: Next Available Appointment - Schedule Surgery

## 2016-11-03 NOTE — Op Note (Signed)
NAME:  Angel [name], Angel [name]                       ACCOUNT NO.:  MEDICAL RECORD NO.:  [id number]  LOCATION:                                 FACILITY:  PHYSICIAN:  Excell Seltzer. Annabell Howells, M.D.    DATE OF BIRTH:  11-08-1935  DATE OF PROCEDURE:  10/30/2016 DATE OF DISCHARGE:                              OPERATIVE REPORT   PROCEDURE:  Cystoscopy with thulium laser resection of the prostate.  PREOPERATIVE DIAGNOSES:  Benign prostatic hyperplasia with retention.  POSTOPERATIVE DIAGNOSES:  Benign prostatic hyperplasia with retention.  SURGEON:  Excell Seltzer. Annabell Howells, M.D.  ANESTHESIA:  General.  SPECIMEN:  None.  DRAINS:  A 22-French Foley catheter with 30 mL balloon.  BLOOD LOSS:  Approximately 100 mL.  COMPLICATIONS:  None.  INDICATIONS:  [name] is a 81 year old Hispanic male with BPH, bladder outlet obstruction and urinary retention.  He is to undergo a thulium laser prostate procedure because of his need for Plavix therapy.  FINDINGS OF PROCEDURE:  He was taken to the operating room, where he was given Ancef.  He had been on amoxicillin preoperatively for a positive culture from his catheter.  He was placed in lithotomy position and fitted with PAS hose, after induction of general anesthetic.  His perineum and genitalia were prepped with Betadine solution.  He was draped in the usual sterile fashion.  Cystoscopy was performed using the laser bridge and the 30-degree lens. Examination revealed a normal urethra.  The external sphincter was intact.  The prostatic urethra was approximately 4 cm in length with bilobar hyperplasia, and asymmetric enlargement of the right lateral lobe at the bladder neck, but no significant middle lobe. Inspection of the bladder revealed considerable edema and erythema from the catheter with some debris at the base of the bladder that was aspirated out.  No tumors were seen.  No stones were seen.  Ureteral orifices were unremarkable.  The 800 micron Thulium laser  fiber was then inserted.  The power was initially set on 100 watts and the bladder neck fibers were defined from 5 to 7 o'clock and a trough was created at the 5 and 7 o'clock positions, to aid resection.  The power was then increased to 150 and further ablation of the prostatic floor.  Bladder neck was performed.  This was followed by ablation of the right lateral lobe and left lateral lobe.  Because of his recent catheterization, the prostate was fairly vascular and there were several bleeders that had to be tracked down and fulgurated. However, after approximately 25 minutes of laser therapy, an excellent channel had been created and no active bleeding was noted.  Final inspection revealed intact ureteral orifices and excellent resection defect and intact external sphincter and no active bleeding.  At this point, the bladder was evacuated free of the laser debris.  The scope was removed and a 22-French Foley was placed.  The balloon was filled with 30 mL of sterile fluid.  The catheter was irrigated with clear return and placed on straight drainage to a leg bag.  He was taken down from the lithotomy position.  His anesthetic was reversed.  He was  moved to the recovery room in stable condition.  There were no complications.     Excell Seltzer. Annabell Howells, M.D.     JJW/MEDQ  D:  10/31/2016  T:  10/31/2016  Job:  [id number]

## 2016-11-24 DIAGNOSIS — R338 Other retention of urine: Secondary | ICD-10-CM | POA: Diagnosis not present

## 2016-12-17 ENCOUNTER — Encounter: Payer: Self-pay | Admitting: Cardiovascular Disease

## 2016-12-26 NOTE — Addendum Note (Signed)
Addendum  created 12/26/16 1158 by Shawn Carattini, MD   Sign clinical note    

## 2017-01-05 ENCOUNTER — Ambulatory Visit (INDEPENDENT_AMBULATORY_CARE_PROVIDER_SITE_OTHER): Payer: 59 | Admitting: Cardiovascular Disease

## 2017-01-05 ENCOUNTER — Encounter: Payer: Self-pay | Admitting: Cardiovascular Disease

## 2017-01-05 VITALS — BP 98/52 | HR 88 | Ht 64.0 in | Wt 154.0 lb

## 2017-01-05 DIAGNOSIS — E782 Mixed hyperlipidemia: Secondary | ICD-10-CM

## 2017-01-05 DIAGNOSIS — I2510 Atherosclerotic heart disease of native coronary artery without angina pectoris: Secondary | ICD-10-CM | POA: Diagnosis not present

## 2017-01-05 NOTE — Patient Instructions (Signed)
Medication Instructions:  STOP Altace (ramipril)   Labwork: Your physician recommends that you return for lab work tomorrow between 7:30 am and 4:30 pm  You will need to FAST for this appointment - nothing to eat or drink after midnight the night before except water.   Testing/Procedures: None Ordered   Follow-Up: Your physician wants you to follow-up in: 6 months with Dr. Elease HashimotoNahser.  You will receive a reminder letter in the mail two months in advance. If you don't receive a letter, please call our office to schedule the follow-up appointment.   If you need a refill on your cardiac medications before your next appointment, please call your pharmacy.   Thank you for choosing [facility]! Eligha BridegroomMichelle Swinyer, RN [phone]

## 2017-01-05 NOTE — Progress Notes (Signed)
Angel [name] Date of Birth  1936/01/27    [address]    [address]    [place], [place]  [address]       Problems: 1. Coronary artery disease-status post PTCA and stenting of the LAD 2. Paralyzed left hemidiaphragm 3. Diabetes mellitus 4. Hypertension 5. Hyperlipidemia  History of Present Illness:  Angel [name] is a 81 yo with the above noted hx.  No chest pain.  He remains active .  He's not had any episodes of chest pain or shortness breath. He still exercises on an intermittent basis.  Nov 29, 2012:  Angel [name] is doing well. No CP,  Breathing is the same- slightly short of breath.  He is still working 4 days a week.  He has put in a garden this spring - has tomatoes, peppers, etc.   Nov. 10, 2014:  Angel [name] is doing well.  No angina.    Dec 05, 2013:  Angel [name] is doing well.  No angina.  Staying active.    January 09, 2015:  Doing well, no episodes of angina. He does all of his normal activities.  Still gardening .    February 04, 2016:  Still working,   Still has his garden . No CP , Breathing is stable   October 03, 2016:  Angel [name] is seen today for follow up visit .  Just getting over the flu and pneumonia. Spent 10 days in hospital.   He has a paralized left hemi - diaphragm and feels about like his normal state.   Saw pulmonary a month ago  - O2 sats were 96% at that time .   No angina .   Back doing his normal activities    January 05, 2017:  Angel [name] is seen today. No cardiac issues  BP is a little low, No dizziness.    Current Outpatient Prescriptions on File Prior to Visit  Medication Sig Dispense Refill  . aspirin 81 MG tablet Take 81 mg by mouth at bedtime.     . clopidogrel (PLAVIX) 75 MG tablet Take 75 mg by mouth at bedtime.    . dutasteride (AVODART) 0.5 MG capsule Take 0.5 mg by mouth daily.     . ramipril (ALTACE) 5 MG capsule Take 5 mg by mouth every evening.     . Tamsulosin HCl (FLOMAX) 0.4 MG CAPS Take 0.4 mg by mouth daily after supper.      No current  facility-administered medications on file prior to visit.     No Known Allergies  Past Medical History:  Diagnosis Date  . Benign prostatic hypertrophy   . Coronary artery disease    status post PTCA and stenting of the left anterior descending artery  . Diabetes mellitus   . Dyspnea   . Hyperlipidemia   . Hypertension   . Paralyzed hemidiaphragm    Left  . Pneumonia    50 years ago    Past Surgical History:  Procedure Laterality Date  . THULIUM LASER TURP (TRANSURETHRAL RESECTION OF PROSTATE) N/A 10/30/2016   Procedure: THULIUM LASER TURP (TRANSURETHRAL RESECTION OF PROSTATE);  Surgeon: Bjorn Pippin, MD;  Location: [facility];  Service: Urology;  Laterality: N/A;    History  Smoking Status  . Never Smoker  Smokeless Tobacco  . Never Used    History  Alcohol Use  . Yes    Family History  Problem Relation Age of Onset  . Cancer Father   . Cancer Brother  Reviw of Systems:  Reviewed in the HPI.  All other systems are negative.  Physical Exam: Blood pressure (!) 98/52, pulse 88, height 5\' 4"  (1.626 m), weight 154 lb (69.9 kg). General: Well developed, well nourished, in no acute distress.  Head: Normocephalic, atraumatic, sclera non-icteric, mucus membranes are moist,  Neck: Supple. Carotids are 2 + without bruits. No JVD Lungs:  He has faint breath sounds in the left upper lung field.  No breath sounds in lower left lung field.   Right is clear Heart: regular rate.   Hyperdynamic heart sonds.  1-2/6 systolic murmur Abdomen: Soft, non-tender, non-distended with normal bowel sounds. No hepatomegaly. No rebound/guarding. No masses. Msk:  Strength and tone are normal Extremities: No clubbing or cyanosis. No edema.  Distal pedal pulses are 2+ and equal bilaterally. Neuro: Alert and oriented X 3. Moves all extremities spontaneously. Psych:  Responds to questions appropriately with a normal affect.  ECG:    Assessment / Plan:   1. Coronary artery disease-status  post PTCA and stenting of the LAD -  patient is doing well. No angina   2. Paralyzed left hemidiaphragm 3. Diabetes mellitus 4. Hypertension -   Slightly low today again. DC altace   5. Hyperlipidemia   -   Check fasting labs tomorrow      7. Chronic diastolic CHF :   Will see him in 3 months    Kristeen MissPhilip Joselle Deeds, MD  01/05/2017 2:54 PM    [facility] [address],  [address] [place], [place]  [id number] Pager [phone] Phone: [phone]; Fax: [phone]

## 2017-01-06 ENCOUNTER — Other Ambulatory Visit: Payer: 59 | Admitting: *Deleted

## 2017-01-06 DIAGNOSIS — E782 Mixed hyperlipidemia: Secondary | ICD-10-CM

## 2017-01-06 DIAGNOSIS — I2510 Atherosclerotic heart disease of native coronary artery without angina pectoris: Secondary | ICD-10-CM

## 2017-01-06 LAB — COMPREHENSIVE METABOLIC PANEL
ALT: 10 IU/L (ref 0–44)
AST: 16 IU/L (ref 0–40)
Albumin/Globulin Ratio: 2 (ref 1.2–2.2)
Albumin: 4.2 g/dL (ref 3.5–4.8)
Alkaline Phosphatase: 124 IU/L — ABNORMAL HIGH (ref 39–117)
BUN/Creatinine Ratio: 31 — ABNORMAL HIGH (ref 10–24)
BUN: 35 mg/dL — ABNORMAL HIGH (ref 8–27)
Bilirubin Total: 0.4 mg/dL (ref 0.0–1.2)
CO2: 26 mmol/L (ref 20–29)
Calcium: 9.3 mg/dL (ref 8.6–10.2)
Chloride: 91 mmol/L — ABNORMAL LOW (ref 96–106)
Creatinine, Ser: 1.14 mg/dL (ref 0.76–1.27)
GFR calc Af Amer: 70 mL/min/{1.73_m2} (ref >59)
GFR calc non Af Amer: 61 mL/min/{1.73_m2} (ref >59)
Globulin, Total: 2.1 g/dL (ref 1.5–4.5)
Glucose: 186 mg/dL — ABNORMAL HIGH (ref 65–99)
Potassium: 4.1 mmol/L (ref 3.5–5.2)
Sodium: 135 mmol/L (ref 134–144)
Total Protein: 6.3 g/dL (ref 6.0–8.5)

## 2017-01-06 LAB — LIPID PANEL
Chol/HDL Ratio: 3.3 ratio (ref 0.0–5.0)
Cholesterol, Total: 142 mg/dL (ref 100–199)
HDL: 43 mg/dL (ref >39)
LDL Calculated: 70 mg/dL (ref 0–99)
Triglycerides: 143 mg/dL (ref 0–149)
VLDL Cholesterol Cal: 29 mg/dL (ref 5–40)

## 2017-01-27 ENCOUNTER — Other Ambulatory Visit: Payer: Self-pay | Admitting: Cardiovascular Disease

## 2017-03-02 DIAGNOSIS — R300 Dysuria: Secondary | ICD-10-CM | POA: Diagnosis not present

## 2017-03-30 ENCOUNTER — Other Ambulatory Visit: Payer: Self-pay | Admitting: Cardiovascular Disease

## 2017-04-06 ENCOUNTER — Telehealth: Payer: Self-pay | Admitting: Nurse Practitioner

## 2017-04-06 NOTE — Telephone Encounter (Signed)
Received document from [facility] that patient has been prescribed Atorvastatin by Dr. Elease Hashimoto and Rosuvastatin by Dr. Julio Sicks, PCP. I left a message for the patient to call back to discuss these medications. Per last ov with Dr. Elease Hashimoto, patient was to continue Atorvastatin 40 mg.   Patient's wife returned the call and verified that patient had both prescriptions. She asked which one was prescribed by Dr. Elease Hashimoto. She states she is not aware of a problem with the Atorvastatin and states this is the one the patient will continue since it was prescribed by Dr. Elease Hashimoto. I thanked her for calling back.

## 2017-04-20 ENCOUNTER — Telehealth: Payer: Self-pay | Admitting: Cardiovascular Disease

## 2017-04-20 NOTE — Telephone Encounter (Signed)
° °  [facility] Pre-operative Risk Assessment    Request for surgical clearance:  1. What type of surgery is being performed? Transurethral Resection of Prostate   2. When is this surgery scheduled? Pending   3. Are there any medications that need to be held prior to surgery and how long?Can pt hold her Plavix and Aspirin 5 days prior to surgery and for 1 to 2 weeks after surgery   4. Name of physician performing surgery? Dr Irine Seal   5. What is your office phone and fax number? [phone] and fax is [phone]  6. Anesthesia type (None, local, MAC, general) ?    Angel [name] 04/20/2017, 10:14 AM  _________________________________________________________________   (provider comments below)

## 2017-04-20 NOTE — Telephone Encounter (Signed)
Ezekial is at low risk for procedure He may hold the Plavix for 5 days prior to procedure. I would prefer that he take the ASA if possible. If absolutely necessary, he may hold the ASA for 5 days also     Kristeen Miss, MD  04/20/2017 11:14 AM    [facility] [address] [place],  [address] [place], [place]  [address] Pager [phone] Phone: [phone]; Fax: [phone]

## 2017-04-20 NOTE — Telephone Encounter (Signed)
Clearance printed and placed in HIM for faxing to Dr. Belva Crome office

## 2017-04-23 ENCOUNTER — Other Ambulatory Visit: Payer: Self-pay | Admitting: Urology

## 2017-04-29 NOTE — Patient Instructions (Signed)
Angel [name]  04/29/2017   Your procedure is scheduled on: 05/05/17    Report to [facility] Main  Entrance Take [place]  elevators to 3rd floor to  Short Stay Center at     0530 AM.     Call this number if you have problems the morning of surgery [phone]    Remember: ONLY 1 PERSON MAY GO WITH YOU TO SHORT STAY TO GET  READY MORNING OF YOUR SURGERY.  Do not eat food or drink liquids :After Midnight.     Take these medicines the morning of surgery with A SIP OF WATER: inhaler if needed and bring, Avodart, Flomax  DO NOT TAKE ANY DIABETIC MEDICATIONS DAY OF YOUR SURGERY                               You may not have any metal on your body including hair pins and              piercings  Do not wear jewelry,  lotions, powders or perfumes, deodorant                          Men may shave face and neck.   Do not bring valuables to the hospital. [facility] IS NOT             RESPONSIBLE   FOR VALUABLES.  Contacts, dentures or bridgework may not be worn into surgery.       Patients discharged the day of surgery will not be allowed to drive home.  Name and phone number of your driver:                Please read over the following fact sheets you were given: _____________________________________________________________________             [facility] - Preparing for Surgery Before surgery, you can play an important role.  Because skin is not sterile, your skin needs to be as free of germs as possible.  You can reduce the number of germs on your skin by washing with CHG (chlorahexidine gluconate) soap before surgery.  CHG is an antiseptic cleaner which kills germs and bonds with the skin to continue killing germs even after washing. Please DO NOT use if you have an allergy to CHG or antibacterial soaps.  If your skin becomes reddened/irritated stop using the CHG and inform your nurse when you arrive at Short Stay. Do not shave (including legs and underarms)  for at least 48 hours prior to the first CHG shower.  You may shave your face/neck. Please follow these instructions carefully:  1.  Shower with CHG Soap the night before surgery and the  morning of Surgery.  2.  If you choose to wash your hair, wash your hair first as usual with your  normal  shampoo.  3.  After you shampoo, rinse your hair and body thoroughly to remove the  shampoo.                           4.  Use CHG as you would any other liquid soap.  You can apply chg directly  to the skin and wash  Gently with a scrungie or clean washcloth.  5.  Apply the CHG Soap to your body ONLY FROM THE NECK DOWN.   Do not use on face/ open                           Wound or open sores. Avoid contact with eyes, ears mouth and genitals (private parts).                       Wash face,  Genitals (private parts) with your normal soap.             6.  Wash thoroughly, paying special attention to the area where your surgery  will be performed.  7.  Thoroughly rinse your body with warm water from the neck down.  8.  DO NOT shower/wash with your normal soap after using and rinsing off  the CHG Soap.                9.  Pat yourself dry with a clean towel.            10.  Wear clean pajamas.            11.  Place clean sheets on your bed the night of your first shower and do not  sleep with pets. Day of Surgery : Do not apply any lotions/deodorants the morning of surgery.  Please wear clean clothes to the hospital/surgery center.  FAILURE TO FOLLOW THESE INSTRUCTIONS MAY RESULT IN THE CANCELLATION OF YOUR SURGERY PATIENT SIGNATURE_________________________________  NURSE SIGNATURE__________________________________  ________________________________________________________________________

## 2017-05-01 ENCOUNTER — Ambulatory Visit (HOSPITAL_COMMUNITY)
Admission: RE | Admit: 2017-05-01 | Discharge: 2017-05-01 | Disposition: A | Discharge status: Home / Self Care | Payer: 59 | Source: Ambulatory Visit | Attending: Anesthesiology | Admitting: Anesthesiology

## 2017-05-01 ENCOUNTER — Encounter (HOSPITAL_COMMUNITY): Payer: Self-pay

## 2017-05-01 ENCOUNTER — Encounter (HOSPITAL_COMMUNITY)
Admission: RE | Admit: 2017-05-01 | Discharge: 2017-05-01 | Disposition: A | Discharge status: Home / Self Care | Payer: 59 | Source: Ambulatory Visit | Attending: Urology | Admitting: Urology

## 2017-05-01 DIAGNOSIS — Z01818 Encounter for other preprocedural examination: Secondary | ICD-10-CM

## 2017-05-01 DIAGNOSIS — N401 Enlarged prostate with lower urinary tract symptoms: Secondary | ICD-10-CM | POA: Diagnosis not present

## 2017-05-01 DIAGNOSIS — N138 Other obstructive and reflux uropathy: Secondary | ICD-10-CM | POA: Insufficient documentation

## 2017-05-01 HISTORY — DX: Unspecified osteoarthritis, unspecified site: M19.90

## 2017-05-01 LAB — BASIC METABOLIC PANEL
Anion gap: 9 (ref 5–15)
BUN: 33 mg/dL — ABNORMAL HIGH (ref 6–20)
CO2: 35 mmol/L — ABNORMAL HIGH (ref 22–32)
Calcium: 9.8 mg/dL (ref 8.9–10.3)
Chloride: 93 mmol/L — ABNORMAL LOW (ref 101–111)
Creatinine, Ser: 1.04 mg/dL (ref 0.61–1.24)
GFR calc Af Amer: >60 mL/min (ref >60)
GFR calc non Af Amer: >60 mL/min (ref >60)
Glucose, Bld: 209 mg/dL — ABNORMAL HIGH (ref 65–99)
Potassium: 4.5 mmol/L (ref 3.5–5.1)
Sodium: 137 mmol/L (ref 135–145)

## 2017-05-01 LAB — CBC
HCT: 40 % (ref 39.0–52.0)
Hemoglobin: 12.8 g/dL — ABNORMAL LOW (ref 13.0–17.0)
MCH: 28.1 pg (ref 26.0–34.0)
MCHC: 32 g/dL (ref 30.0–36.0)
MCV: 87.9 fL (ref 78.0–100.0)
Platelets: 292 10*3/uL (ref 150–400)
RBC: 4.55 MIL/uL (ref 4.22–5.81)
RDW: 13.9 % (ref 11.5–15.5)
WBC: 12.3 10*3/uL — ABNORMAL HIGH (ref 4.0–10.5)

## 2017-05-01 LAB — GLUCOSE, CAPILLARY: Glucose-Capillary: 210 mg/dL — ABNORMAL HIGH (ref 65–99)

## 2017-05-01 LAB — HEMOGLOBIN A1C
Hgb A1c MFr Bld: 10.9 % — ABNORMAL HIGH (ref 4.8–5.6)
Mean Plasma Glucose: 266.13 mg/dL

## 2017-05-01 NOTE — Progress Notes (Signed)
08/13/16-EKG-epic  ECHO-08/12/16-epic  04/20/17-Clearance in telephone note - Dr Elease Hashimoto- epic  01/05/17-LOV-dr nahser-epic

## 2017-05-01 NOTE — Progress Notes (Addendum)
CBC and BMP  done 05/01/17 faxed via epic to Dr Annabell Howells.

## 2017-05-01 NOTE — Progress Notes (Signed)
HGBA1C done 05/01/17 routed via epic to dr Annabell Howells.

## 2017-05-04 NOTE — H&P (Signed)
CC: I have an enlarged prostate (S/P Surgery).  HPI: Angel [name] is a 81 year-old male established patient who is here for an enlarged prostate after surgical intervention.  He has had a laser turp for treatment of his lower urinary tract symptoms due to his BPH. It was performed 10/30/2016. He has been treated with Flomax and Avodart. He is not on new medications for symptoms of prostate enlargement.   He does have an abnormal sensation when needing to urinate. He does not have a good size and strength to his urinary stream. He does not have hesitancy or straining. He is not having problems with emptying his bladder well.   Angel [name] returns today in f/u. He continues to have dysuria. He has nocturia q1hr. He does better during the day. He has a reduced stream. He has had no further hematuria. He remains on tamsulosin and dutasteride. His UA looks infected today.     ALLERGIES: No Allergies    MEDICATIONS: Aspirin 81 mg tablet, chewable Oral  Atorvastatin Calcium 40 mg tablet Oral  Dutasteride 0.5 mg capsule 1 capsule PO Daily  Glipizide-Metformin 5 mg-500 mg tablet  Nitroglycerin 0.4 mg tablet, sublingual  Orphenadrine Citrate 100 mg tablet, extended release Oral  Plavix 75 mg tablet Oral  Proventil Hfa 90 mcg hfa aerosol with adapter Inhalation  Tamsulosin HCl - 0.4 MG Oral Capsule 1 capsule PO Q HS     GU PSH: Complex cystometrogram, w/ void pressure and urethral pressure profile studies, any technique - 09/22/2016 Complex Uroflow - 09/22/2016 Cystoscopy - 10/02/2016 Emg surf Electrd - 09/22/2016 Inject For cystogram - 09/22/2016 Intrabd voidng Press - 09/22/2016 Laser Surgery Prostate - 10/30/2016      PSH Notes: Cath Stent Placement, Back Surgery   NON-GU PSH: Cardiac Stent Placement    GU PMH: BPH w/LUTS, He continues to have dysuria with nocturia q1hr following the Thulium procedure. I will reculture the urine and give him doxycycline. I will have him return in 4-6 weeks for a  flowrate and possible cystoscopy. - 03/02/2017, Benign prostatic hyperplasia with urinary obstruction, - 11/12/2015 Dysuria - 03/02/2017 Urinary Retention, He failed a voiding trial today and has trilobar hyperplasia with a ball valving middle lobe. I am going to get him set up for a Thulium laser prostatectomy and reviewed the risks in detail. I will have him cleared by Dr. Elease Hashimoto to see if he can come off of the plavix but I think we can still do it on Plavix if needed. - 10/02/2016, - 08/26/2016 Incomplete bladder emptying - 09/09/2016 Elevated PSA, Elevated prostate specific antigen (PSA) - 11/12/2015 Nocturia, Nocturia - 11/12/2015      PMH Notes:  2006-07-27 10:58:39 - Note: Coronary Artery Disease   NON-GU PMH: Encounter for general adult medical examination without abnormal findings, Encounter for preventive health examination - 11/12/2015 Glycosuria, Glycosuria - 2016 Personal history of other diseases of the circulatory system, History of hypertension - 2014 Personal history of other endocrine, nutritional and metabolic disease, History of diabetes mellitus - 2014, History of hypercholesterolemia, - 2014, History of diabetes mellitus, - 2014 Coronary Artery Disease Stroke/TIA    FAMILY HISTORY: Family Health Status Number - Runs In Family No pertinent family history - Other   SOCIAL HISTORY: Marital Status: Married Preferred Language: English; Ethnicity:  Current Smoking Status: Patient has never smoked.   Tobacco Use Assessment Completed: Used Tobacco in last 30 days? Has never drank.  Does not drink caffeine.     Notes: Never a  smoker, Alcohol Use, Marital History - Currently Married, Death In The Family Mother, Tobacco Use, Death In The Family Father, Occupation:   REVIEW OF SYSTEMS:    GU Review Male:   Patient reports frequent urination, hard to postpone urination, burning/ pain with urination, and get up at night to urinate. Patient denies leakage of urine, stream starts and  stops, trouble starting your stream, have to strain to urinate , erection problems, and penile pain.  Gastrointestinal (Upper):   Patient denies nausea, vomiting, and indigestion/ heartburn.  Gastrointestinal (Lower):   Patient denies diarrhea and constipation.  Constitutional:   Patient denies fever, night sweats, weight loss, and fatigue.  Skin:   Patient denies skin rash/ lesion and itching.  Eyes:   Patient denies blurred vision and double vision.  Ears/ Nose/ Throat:   Patient denies sore throat and sinus problems.  Hematologic/Lymphatic:   Patient denies swollen glands and easy bruising.  Cardiovascular:   Patient denies chest pains and leg swelling.  Respiratory:   Patient denies cough and shortness of breath.  Endocrine:   Patient denies excessive thirst.  Musculoskeletal:   Patient denies back pain and joint pain.  Neurological:   Patient denies headaches and dizziness.  Psychologic:   Patient denies depression and anxiety.   VITAL SIGNS:      04/10/2017 09:44 AM  BP 153/77 mmHg  Pulse 85 /min  Temperature 98.1 F / 36.7 C   MULTI-SYSTEM PHYSICAL EXAMINATION:    Constitutional: Well-nourished. No physical deformities. Normally developed. Good grooming.  Respiratory: No labored breathing, no use of accessory muscles. CTA  Cardiovascular: Normal temperature, RRR with SEM     PAST DATA REVIEWED:  Source Of History:  Patient  Urine Test Review:   Urinalysis   11/06/15 10/09/14 10/03/13 10/04/12 10/06/11 08/02/10 08/03/09 01/26/09  PSA  Total PSA 1.91  2.14  1.90  2.04  1.95  1.93  2.41  2.55     PROCEDURES:         Flexible Cystoscopy - 52000  Risks, benefits, and some of the potential complications of the procedure were discussed. 10ml of 2% lidocaine jelly was instilled intraurethrally.  Cipro  given for antibiotic prophylaxis.     Meatus:  Normal size. Normal location. Normal condition.  Urethra:  No strictures.  External Sphincter:  Normal.  Verumontanum:   Normal.  Prostate:  Obstructing. Moderate hyperplasia. There is necrotic tissue anteriorly and laterally on both lobes from the laser. He has residual obstruction from the lateral lobes.   Bladder Neck:  Non-obstructing.  Ureteral Orifices:  Normal location. Normal size. Normal shape. Effluxed clear urine.  Bladder:  Moderate trabeculation. Erythematous mucosa. No tumors. No stones. There is puralent debris in the bladder.      The procedure was well tolerated and there were no complications.          Flow Rate - 51741  Flow Time: 0:12 min:sec  Voided Volume: 74 cc  Peak Flow Rate: 7 cc/sec  Time of Peak Flow: 0:03 min:sec  Average Flow Rate: 5 cc/sec  Total Void Time: 0:15 min:sec           PVR Ultrasound - 16109  Scanned Volume: 90 cc         Urinalysis w/Scope Dipstick Dipstick Cont'd Micro  Color: Yellow Bilirubin: Neg WBC/hpf: >60/hpf  Appearance: Cloudy Ketones: Neg RBC/hpf: 3 - 10/hpf  Specific Gravity: 1.025 Blood: 1+ Bacteria: Rare (0-9/hpf)  pH: 5.5 Protein: 2+ Cystals: NS (Not Seen)  Glucose: Trace  Urobilinogen: 0.2 Casts: NS (Not Seen)    Nitrites: Neg Trichomonas: Not Present    Leukocyte Esterase: 3+ Mucous: Not Present      Epithelial Cells: NS (Not Seen)      Yeast: NS (Not Seen)      Sperm: Not Present    ASSESSMENT:      ICD-10 Details  1 GU:   BPH w/LUTS - N40.1 He has persistent voiding symptoms with poor healing from the laser and residual obstruction. I think the best option at this time would be to proceed with TURP. I reviewed the risks and gave him a handout on the procedure. I will get him cleared by Dr. Elease Hashimoto to come off of the plavix.   2   Dysuria - R30.0 Urine culture today.   3   Incomplete bladder emptying - R39.14    PLAN:           Orders Labs Urine Culture          Schedule Return Visit/Planned Activity: Next Available Appointment - Schedule Surgery

## 2017-05-04 NOTE — Anesthesia Preprocedure Evaluation (Signed)
Anesthesia Evaluation  Patient identified by MRN, date of birth, ID band Patient awake    Reviewed: Allergy & Precautions, NPO status , Patient's Chart, lab work & pertinent test results  Airway Mallampati: I       Dental no notable dental hx.    Pulmonary COPD,    Pulmonary exam normal        Cardiovascular hypertension, Pt. on medications + CAD and + Cardiac Stents  Normal cardiovascular exam     Neuro/Psych    GI/Hepatic negative GI ROS, Neg liver ROS,   Endo/Other  diabetes, Oral Hypoglycemic Agents  Renal/GU negative Renal ROS     Musculoskeletal negative musculoskeletal ROS (+) Arthritis , Osteoarthritis,    Abdominal Normal abdominal exam  (+)   Peds  Hematology  (+) Blood dyscrasia, anemia ,   Anesthesia Other Findings Echo 1/18 Left ventricle:  The cavity size was normal. Wall thickness was increased in a pattern of mild LVH. Systolic function was normal. The estimated ejection fraction was in the range of 55% to 60%. Wall motion was normal; there were no regional wall motion abnormalities. Features are consistent with a pseudonormal left ventricular filling pattern, with concomitant abnormal relaxation and increased filling pressure (grade 2 diastolic dysfunction).  Reproductive/Obstetrics                             Anesthesia Physical  Anesthesia Plan  ASA: III  Anesthesia Plan: General   Post-op Pain Management:    Induction: Intravenous  PONV Risk Score and Plan: 1 and Ondansetron, Dexamethasone and Treatment may vary due to age or medical condition  Airway Management Planned: LMA and Oral ETT  Additional Equipment:   Intra-op Plan:   Post-operative Plan:   Informed Consent: I have reviewed the patients History and Physical, chart, labs and discussed the procedure including the risks, benefits and alternatives for the proposed anesthesia with the patient or  authorized representative who has indicated his/her understanding and acceptance.   Dental advisory given  Plan Discussed with:   Anesthesia Plan Comments:         Anesthesia Quick Evaluation

## 2017-05-05 ENCOUNTER — Encounter (HOSPITAL_COMMUNITY): Payer: Self-pay | Admitting: *Deleted

## 2017-05-05 ENCOUNTER — Encounter (HOSPITAL_COMMUNITY)
Admission: RE | Disposition: A | Discharge status: Home / Self Care | Payer: Self-pay | Source: Ambulatory Visit | Attending: Urology

## 2017-05-05 ENCOUNTER — Observation Stay (HOSPITAL_COMMUNITY)
Admission: RE | Admit: 2017-05-05 | Discharge: 2017-05-07 | Disposition: A | Discharge status: Home / Self Care | Payer: 59 | Source: Ambulatory Visit | Attending: Urology | Admitting: Urology

## 2017-05-05 ENCOUNTER — Ambulatory Visit (HOSPITAL_COMMUNITY): Payer: 59 | Admitting: Anesthesiology

## 2017-05-05 DIAGNOSIS — Z7984 Long term (current) use of oral hypoglycemic drugs: Secondary | ICD-10-CM | POA: Insufficient documentation

## 2017-05-05 DIAGNOSIS — Z7982 Long term (current) use of aspirin: Secondary | ICD-10-CM | POA: Diagnosis not present

## 2017-05-05 DIAGNOSIS — Z7902 Long term (current) use of antithrombotics/antiplatelets: Secondary | ICD-10-CM | POA: Insufficient documentation

## 2017-05-05 DIAGNOSIS — I10 Essential (primary) hypertension: Secondary | ICD-10-CM | POA: Diagnosis not present

## 2017-05-05 DIAGNOSIS — R3914 Feeling of incomplete bladder emptying: Secondary | ICD-10-CM | POA: Insufficient documentation

## 2017-05-05 DIAGNOSIS — E785 Hyperlipidemia, unspecified: Secondary | ICD-10-CM | POA: Insufficient documentation

## 2017-05-05 DIAGNOSIS — Z79899 Other long term (current) drug therapy: Secondary | ICD-10-CM | POA: Diagnosis not present

## 2017-05-05 DIAGNOSIS — Z955 Presence of coronary angioplasty implant and graft: Secondary | ICD-10-CM | POA: Insufficient documentation

## 2017-05-05 DIAGNOSIS — R300 Dysuria: Secondary | ICD-10-CM | POA: Diagnosis not present

## 2017-05-05 DIAGNOSIS — N320 Bladder-neck obstruction: Secondary | ICD-10-CM | POA: Diagnosis not present

## 2017-05-05 DIAGNOSIS — M1990 Unspecified osteoarthritis, unspecified site: Secondary | ICD-10-CM | POA: Diagnosis not present

## 2017-05-05 DIAGNOSIS — N400 Enlarged prostate without lower urinary tract symptoms: Secondary | ICD-10-CM | POA: Diagnosis not present

## 2017-05-05 DIAGNOSIS — I2510 Atherosclerotic heart disease of native coronary artery without angina pectoris: Secondary | ICD-10-CM | POA: Insufficient documentation

## 2017-05-05 DIAGNOSIS — I5032 Chronic diastolic (congestive) heart failure: Secondary | ICD-10-CM | POA: Diagnosis not present

## 2017-05-05 DIAGNOSIS — I110 Hypertensive heart disease with heart failure: Secondary | ICD-10-CM | POA: Diagnosis not present

## 2017-05-05 DIAGNOSIS — N401 Enlarged prostate with lower urinary tract symptoms: Principal | ICD-10-CM | POA: Insufficient documentation

## 2017-05-05 DIAGNOSIS — E119 Type 2 diabetes mellitus without complications: Secondary | ICD-10-CM | POA: Diagnosis not present

## 2017-05-05 DIAGNOSIS — N138 Other obstructive and reflux uropathy: Secondary | ICD-10-CM

## 2017-05-05 HISTORY — PX: TRANSURETHRAL RESECTION OF PROSTATE: SHX73

## 2017-05-05 HISTORY — DX: Other obstructive and reflux uropathy: N13.8

## 2017-05-05 HISTORY — DX: Benign prostatic hyperplasia with lower urinary tract symptoms: N40.1

## 2017-05-05 LAB — GLUCOSE, CAPILLARY
Glucose-Capillary: 110 mg/dL — ABNORMAL HIGH (ref 65–99)
Glucose-Capillary: 181 mg/dL — ABNORMAL HIGH (ref 65–99)
Glucose-Capillary: 175 mg/dL — ABNORMAL HIGH (ref 65–99)
Glucose-Capillary: 137 mg/dL — ABNORMAL HIGH (ref 65–99)
Glucose-Capillary: 220 mg/dL — ABNORMAL HIGH (ref 65–99)

## 2017-05-05 SURGERY — TURP (TRANSURETHRAL RESECTION OF PROSTATE)
Anesthesia: General

## 2017-05-05 MED ORDER — GLIPIZIDE-METFORMIN HCL 5-500 MG PO TABS: 1.0000 | ORAL_TABLET | Freq: Two times a day (BID) | ORAL | Status: DC

## 2017-05-05 MED ORDER — METFORMIN HCL 500 MG PO TABS
500.0000 mg | ORAL_TABLET | Freq: Every day | ORAL | Status: DC
  Administered 2017-05-06: 500 mg via ORAL
  Filled 2017-05-05: qty 1

## 2017-05-05 MED ORDER — ACETAMINOPHEN 325 MG PO TABS: 650.0000 mg | ORAL_TABLET | ORAL | Status: DC | PRN

## 2017-05-05 MED ORDER — NITROGLYCERIN 0.4 MG SL SUBL: 0.4000 mg | SUBLINGUAL_TABLET | SUBLINGUAL | Status: DC | PRN

## 2017-05-05 MED ORDER — LACTATED RINGERS IV SOLN
INTRAVENOUS | Status: DC | PRN
  Administered 2017-05-05: 07:00:00 via INTRAVENOUS

## 2017-05-05 MED ORDER — SODIUM CHLORIDE 0.9 % IR SOLN
Status: DC | PRN
  Administered 2017-05-05: 18000 mL

## 2017-05-05 MED ORDER — FENTANYL CITRATE (PF) 100 MCG/2ML IJ SOLN
INTRAMUSCULAR | Status: AC
  Filled 2017-05-05: qty 2

## 2017-05-05 MED ORDER — HYDROMORPHONE HCL 1 MG/ML IJ SOLN
0.5000 mg | INTRAMUSCULAR | Status: DC | PRN
  Administered 2017-05-05: 1 mg via INTRAVENOUS
  Filled 2017-05-05: qty 1

## 2017-05-05 MED ORDER — CEFAZOLIN SODIUM-DEXTROSE 2-4 GM/100ML-% IV SOLN
2.0000 g | Freq: Once | INTRAVENOUS | Status: AC
  Administered 2017-05-05: 2 g via INTRAVENOUS

## 2017-05-05 MED ORDER — PROPOFOL 10 MG/ML IV BOLUS
INTRAVENOUS | Status: DC | PRN
  Administered 2017-05-05: 30 mg via INTRAVENOUS
  Administered 2017-05-05: 100 mg via INTRAVENOUS
  Administered 2017-05-05: 30 mg via INTRAVENOUS

## 2017-05-05 MED ORDER — ONDANSETRON HCL 4 MG/2ML IJ SOLN
INTRAMUSCULAR | Status: AC
  Filled 2017-05-05: qty 2

## 2017-05-05 MED ORDER — DIPHENHYDRAMINE HCL 50 MG/ML IJ SOLN: 12.5000 mg | Freq: Four times a day (QID) | INTRAMUSCULAR | Status: DC | PRN

## 2017-05-05 MED ORDER — ONDANSETRON HCL 4 MG/2ML IJ SOLN: 4.0000 mg | INTRAMUSCULAR | Status: DC | PRN

## 2017-05-05 MED ORDER — GENTAMICIN SULFATE 40 MG/ML IJ SOLN
5.0000 mg/kg | Freq: Once | INTRAMUSCULAR | Status: DC
  Filled 2017-05-05: qty 8.5

## 2017-05-05 MED ORDER — CEFAZOLIN SODIUM-DEXTROSE 1-4 GM/50ML-% IV SOLN
1.0000 g | Freq: Three times a day (TID) | INTRAVENOUS | Status: DC
  Administered 2017-05-05 – 2017-05-07 (×6): 1 g via INTRAVENOUS
  Filled 2017-05-05 (×8): qty 50

## 2017-05-05 MED ORDER — GENTAMICIN SULFATE 40 MG/ML IJ SOLN
INTRAMUSCULAR | Status: DC | PRN
  Administered 2017-05-05: 340 mg via INTRAVENOUS

## 2017-05-05 MED ORDER — MEPERIDINE HCL 50 MG/ML IJ SOLN: 6.2500 mg | INTRAMUSCULAR | Status: DC | PRN

## 2017-05-05 MED ORDER — VITAMIN B-12 1000 MCG PO TABS
1000.0000 ug | ORAL_TABLET | Freq: Every day | ORAL | Status: DC
  Administered 2017-05-05 – 2017-05-07 (×3): 1000 ug via ORAL
  Filled 2017-05-05 (×3): qty 1

## 2017-05-05 MED ORDER — FUROSEMIDE 40 MG PO TABS
40.0000 mg | ORAL_TABLET | Freq: Every day | ORAL | Status: DC
  Administered 2017-05-05 – 2017-05-07 (×3): 40 mg via ORAL
  Filled 2017-05-05 (×3): qty 1

## 2017-05-05 MED ORDER — ONDANSETRON HCL 4 MG/2ML IJ SOLN
INTRAMUSCULAR | Status: DC | PRN
  Administered 2017-05-05: 4 mg via INTRAVENOUS

## 2017-05-05 MED ORDER — SENNOSIDES-DOCUSATE SODIUM 8.6-50 MG PO TABS
1.0000 | ORAL_TABLET | Freq: Every evening | ORAL | Status: DC | PRN
Start: 2017-05-05 — End: 2017-05-07

## 2017-05-05 MED ORDER — LIDOCAINE 2% (20 MG/ML) 5 ML SYRINGE
INTRAMUSCULAR | Status: AC
  Filled 2017-05-05: qty 5

## 2017-05-05 MED ORDER — FENTANYL CITRATE (PF) 100 MCG/2ML IJ SOLN
25.0000 ug | INTRAMUSCULAR | Status: DC | PRN
  Administered 2017-05-05 (×2): 50 ug via INTRAVENOUS
  Administered 2017-05-05 (×2): 25 ug via INTRAVENOUS

## 2017-05-05 MED ORDER — CEFAZOLIN SODIUM-DEXTROSE 2-4 GM/100ML-% IV SOLN
INTRAVENOUS | Status: AC
  Filled 2017-05-05: qty 100

## 2017-05-05 MED ORDER — LIDOCAINE 2% (20 MG/ML) 5 ML SYRINGE
INTRAMUSCULAR | Status: DC | PRN
  Administered 2017-05-05: 100 mg via INTRAVENOUS

## 2017-05-05 MED ORDER — OXYCODONE HCL 5 MG PO TABS: 5.0000 mg | ORAL_TABLET | ORAL | Status: DC | PRN

## 2017-05-05 MED ORDER — BISACODYL 10 MG RE SUPP: 10.0000 mg | Freq: Every day | RECTAL | Status: DC | PRN

## 2017-05-05 MED ORDER — POTASSIUM CHLORIDE IN NACL 20-0.45 MEQ/L-% IV SOLN
INTRAVENOUS | Status: DC
  Administered 2017-05-05 – 2017-05-06 (×2): via INTRAVENOUS
  Filled 2017-05-05 (×2): qty 1000

## 2017-05-05 MED ORDER — SODIUM CHLORIDE 0.9 % IR SOLN
3000.0000 mL | Status: DC
  Administered 2017-05-05: 3000 mL

## 2017-05-05 MED ORDER — FENTANYL CITRATE (PF) 100 MCG/2ML IJ SOLN
INTRAMUSCULAR | Status: DC | PRN
  Administered 2017-05-05 (×3): 25 ug via INTRAVENOUS

## 2017-05-05 MED ORDER — POTASSIUM CHLORIDE CRYS ER 20 MEQ PO TBCR
20.0000 meq | EXTENDED_RELEASE_TABLET | Freq: Every day | ORAL | Status: DC
  Administered 2017-05-05 – 2017-05-07 (×3): 20 meq via ORAL
  Filled 2017-05-05 (×3): qty 1

## 2017-05-05 MED ORDER — PROPOFOL 10 MG/ML IV BOLUS
INTRAVENOUS | Status: AC
  Filled 2017-05-05: qty 20

## 2017-05-05 MED ORDER — DIPHENHYDRAMINE HCL 12.5 MG/5ML PO ELIX: 12.5000 mg | ORAL_SOLUTION | Freq: Four times a day (QID) | ORAL | Status: DC | PRN

## 2017-05-05 MED ORDER — METFORMIN HCL 500 MG PO TABS
750.0000 mg | ORAL_TABLET | Freq: Every day | ORAL | Status: DC
  Administered 2017-05-06 – 2017-05-07 (×2): 750 mg via ORAL
  Filled 2017-05-05 (×2): qty 2

## 2017-05-05 MED ORDER — GLIPIZIDE 5 MG PO TABS
7.5000 mg | ORAL_TABLET | Freq: Every day | ORAL | Status: DC
  Administered 2017-05-06 – 2017-05-07 (×2): 7.5 mg via ORAL
  Filled 2017-05-05 (×2): qty 2

## 2017-05-05 MED ORDER — ZOLPIDEM TARTRATE 5 MG PO TABS: 5.0000 mg | ORAL_TABLET | Freq: Every evening | ORAL | Status: DC | PRN

## 2017-05-05 MED ORDER — INSULIN ASPART 100 UNIT/ML ~~LOC~~ SOLN
0.0000 [IU] | Freq: Three times a day (TID) | SUBCUTANEOUS | Status: DC
  Administered 2017-05-06 – 2017-05-07 (×3): 3 [IU] via SUBCUTANEOUS

## 2017-05-05 MED ORDER — HYOSCYAMINE SULFATE 0.125 MG SL SUBL
0.1250 mg | SUBLINGUAL_TABLET | SUBLINGUAL | Status: DC | PRN
  Filled 2017-05-05: qty 1

## 2017-05-05 MED ORDER — DUTASTERIDE 0.5 MG PO CAPS
0.5000 mg | ORAL_CAPSULE | Freq: Every day | ORAL | Status: DC
  Administered 2017-05-06 – 2017-05-07 (×2): 0.5 mg via ORAL
  Filled 2017-05-05 (×3): qty 1

## 2017-05-05 MED ORDER — GLIPIZIDE 5 MG PO TABS
5.0000 mg | ORAL_TABLET | Freq: Every day | ORAL | Status: DC
  Administered 2017-05-06: 5 mg via ORAL
  Filled 2017-05-05: qty 1

## 2017-05-05 MED ORDER — FLEET ENEMA 7-19 GM/118ML RE ENEM: 1.0000 | ENEMA | Freq: Once | RECTAL | Status: DC | PRN

## 2017-05-05 MED ORDER — ALBUTEROL SULFATE (2.5 MG/3ML) 0.083% IN NEBU: 3.0000 mL | INHALATION_SOLUTION | RESPIRATORY_TRACT | Status: DC | PRN

## 2017-05-05 MED ORDER — ATORVASTATIN CALCIUM 40 MG PO TABS
40.0000 mg | ORAL_TABLET | Freq: Every day | ORAL | Status: DC
  Administered 2017-05-05 – 2017-05-06 (×2): 40 mg via ORAL
  Filled 2017-05-05 (×3): qty 1

## 2017-05-05 MED ORDER — DOCUSATE SODIUM 100 MG PO CAPS
100.0000 mg | ORAL_CAPSULE | Freq: Two times a day (BID) | ORAL | Status: DC
  Administered 2017-05-05 – 2017-05-07 (×4): 100 mg via ORAL
  Filled 2017-05-05 (×4): qty 1

## 2017-05-05 SURGICAL SUPPLY — 16 items
BAG URINE DRAINAGE (UROLOGICAL SUPPLIES) ×1 IMPLANT
BAG URO CATCHER STRL LF (MISCELLANEOUS) ×2 IMPLANT
CATH FOLEY 3WAY 30CC 22FR (CATHETERS) ×1 IMPLANT
COVER FOOTSWITCH UNIV (MISCELLANEOUS) ×1 IMPLANT
COVER SURGICAL LIGHT HANDLE (MISCELLANEOUS) ×2 IMPLANT
ELECT REM PT RETURN 15FT ADLT (MISCELLANEOUS) ×2 IMPLANT
GLOVE SURG SS PI 8.0 STRL IVOR (GLOVE) ×1 IMPLANT
GOWN STRL REUS W/TWL XL LVL3 (GOWN DISPOSABLE) ×2 IMPLANT
HOLDER FOLEY CATH W/STRAP (MISCELLANEOUS) ×1 IMPLANT
LOOP CUT BIPOLAR 24F LRG (ELECTROSURGICAL) ×1 IMPLANT
MANIFOLD NEPTUNE II (INSTRUMENTS) ×2 IMPLANT
PACK CYSTO (CUSTOM PROCEDURE TRAY) ×2 IMPLANT
SET ASPIRATION TUBING (TUBING) ×2 IMPLANT
SYR 30ML LL (SYRINGE) ×1 IMPLANT
SYRINGE IRR TOOMEY STRL 70CC (SYRINGE) ×1 IMPLANT
TUBING CONNECTING 10 (TUBING) ×2 IMPLANT

## 2017-05-05 NOTE — Brief Op Note (Signed)
05/05/2017  8:21 AM  PATIENT:  Nyra Market  81 y.o. male  PRE-OPERATIVE DIAGNOSIS:  BENIGN PROSTATIC HYPERPLASIA, BLADDER OUTLET OBSTRUCTION  POST-OPERATIVE DIAGNOSIS:  BENIGN PROSTATIC HYPERPLASIA, BLADDER OUTLET OBSTRUCTION  PROCEDURE:  Procedure(s) with comments: TRANSURETHRAL RESECTION OF THE PROSTATE (TURP) (N/A) - NEEDS 60 MIN FOR PROCEDURE  SURGEON:  Surgeon(s) and Role:    * Bjorn Pippin, MD - Primary  PHYSICIAN ASSISTANT:   ASSISTANTS: none   ANESTHESIA:   general  EBL:  Total I/O In: -  Out: 100 [Blood:100]  BLOOD ADMINISTERED:none  DRAINS: Urinary Catheter (Foley)   LOCAL MEDICATIONS USED:  NONE  SPECIMEN:  Source of Specimen:  prostate chips  DISPOSITION OF SPECIMEN:  PATHOLOGY  COUNTS:  YES  TOURNIQUET:  * No tourniquets in log *  DICTATION: .Other Dictation: Dictation Number [id number]  PLAN OF CARE: Admit for overnight observation  PATIENT DISPOSITION:  PACU - hemodynamically stable.   Delay start of Pharmacological VTE agent (>24hrs) due to surgical blood loss or risk of bleeding: yes

## 2017-05-05 NOTE — Progress Notes (Signed)
Patient ID: Angel [name], male   DOB: [birth year], 81 y.o.   MRN: [id number]  Checked on patient in PACU.    He had some LLQ pain but that had abated.    Urine is light pink on CBI.  BP (!) 113/54 (BP Location: Left Arm)   Pulse 79   Temp 98.1 F (36.7 C)   Resp 16   SpO2 99%   Continue CBI.  Anticipate DC on 10/18.

## 2017-05-05 NOTE — Anesthesia Postprocedure Evaluation (Signed)
Anesthesia Post Note  Patient: Angel [name]  Procedure(s) Performed: TRANSURETHRAL RESECTION OF THE PROSTATE (TURP) (N/A )     Patient location during evaluation: PACU Anesthesia Type: General Level of consciousness: awake and alert Pain management: pain level controlled Vital Signs Assessment: post-procedure vital signs reviewed and stable Respiratory status: spontaneous breathing, nonlabored ventilation, respiratory function stable and patient connected to nasal cannula oxygen Cardiovascular status: blood pressure returned to baseline and stable Postop Assessment: no apparent nausea or vomiting Anesthetic complications: no    Last Vitals:  Vitals:   05/05/17 0540 05/05/17 0850  BP: 130/68   Pulse: 90 98  Resp: 18 (!) 31  Temp: 36.6 C   SpO2: 92% 99%    Last Pain:  Vitals:   05/05/17 0540  TempSrc: Oral                 Khaiden Segreto

## 2017-05-05 NOTE — Interval H&P Note (Signed)
History and Physical Interval Note:  05/05/2017 6:58 AM  Angel [name]  has presented today for surgery, with the diagnosis of BENIGN PROSTATIC HYPERPLASIA, BLADDER OUTLET OBSTRUCTION  The various methods of treatment have been discussed with the patient and family. After consideration of risks, benefits and other options for treatment, the patient has consented to  Procedure(s) with comments: TRANSURETHRAL RESECTION OF THE PROSTATE (TURP) (N/A) - NEEDS 60 MIN FOR PROCEDURE as a surgical intervention .  The patient's history has been reviewed, patient examined, no change in status, stable for surgery.  I have reviewed the patient's chart and labs.  Questions were answered to the patient's satisfaction.     Dafina Suk J

## 2017-05-05 NOTE — Op Note (Signed)
NAME:  Angel [name], Angel [name]                      ACCOUNT NO.:  MEDICAL RECORD NO.:  [id number]  LOCATION:                                 FACILITY:  PHYSICIAN:  Excell Seltzer. Annabell Howells, M.D.         DATE OF BIRTH:  DATE OF PROCEDURE:  05/05/2017 DATE OF DISCHARGE:                              OPERATIVE REPORT   Patient of Dr. Bjorn Pippin.  PROCEDURE:  Transurethral resection of prostate.  PREOPERATIVE DIAGNOSIS:  Benign prostatic hypertrophy with bladder outlet obstruction.  POSTOPERATIVE DIAGNOSIS:  Benign prostatic hypertrophy with bladder outlet obstruction.  SURGEON:  Excell Seltzer. Annabell Howells, M.D.  ANESTHESIA:  General.  SPECIMEN:  Prostate chips.  DRAINS:  A 22-French 3-way Foley catheter.  BLOOD LOSS:  100 mL.  COMPLICATIONS:  None.  INDICATIONS:  Mr. Propes is a 81 year old Hispanic male, who had previously undergone a laser prostate ablation procedure.  He had persistent irritative voiding symptoms and re-cystoscopy revealed areas of nonhealing and the lateral lobes with persistent obstruction, and after reviewing the options, it was felt that TURP was indicated.  FINDINGS AND PROCEDURE:  He was taken to the operating room where he was given Ancef and subsequently gentamicin.  A general anesthetic was induced.  He was placed in lithotomy position.  He was fitted with PAS hose.  His perineum and genitalia were prepped with Betadine solution. He was draped in usual sterile fashion.  The urethra was calibrated with 30-French with Sissy Hoff sounds and a 28- French continuous flow resectoscope sheath was placed using the visual obturator and 30-degree lens.  The visual obturator was replaced with an Wandra Scot handle with a bipolar loop and the 30-degree lens.  Saline was used as the irrigant.  Inspection revealed a normal urethra.  The external sphincter was intact.  The prostatic urethra was approximately 4 cm in length with bilobar hyperplasia with obstruction in the anterolateral  lobes bilaterally were areas of somewhat necrotic-appearing laser ablation sites.  There appeared to be prior resection at the bladder neck that has healed.  Inspection of the bladder revealed somewhat turbid urine with some debris.  The mucosa was erythematous, but without other lesions. Ureteral orifices were unremarkable.  No stones were seen.  Preoperative culture had been unremarkable.  Resection was initiated on the left in the anterolateral lobe where a trough was created from bladder neck to apex.  The lobe was then resected to the floor of the prostate out to capsular fibers.  These chips were evacuated.  Hemostasis was achieved.  The right lobe was then resected in identical fashion.  Some residual tissue on the floor of the prostate was then resected out to alongside the verumontanum.  Hemostasis was achieved.  The bladder was evacuated free of chips. Final inspection revealed an excellent TUR channel, no retained chips, intact ureteral orifices, no active bleeding, and intact external sphincter.  The scope was removed and a 22-French 3-way Foley catheter was placed to the aid of a catheter guide.  The balloon was filled with 30 mL of sterile fluid.  The catheter was irrigated with clear return and placed to continuous irrigation and straight  drainage.  He was taken down from lithotomy position.  His anesthetic was reversed.  He was moved to the recovery room in stable condition.  There were no complications.     Excell Seltzer. Annabell Howells, M.D.     JJW/MEDQ  D:  05/05/2017  T:  05/05/2017  Job:  [id number]

## 2017-05-05 NOTE — Anesthesia Procedure Notes (Signed)
Procedure Name: LMA Insertion Date/Time: 05/05/2017 7:29 AM Performed by: Maris Berger T Pre-anesthesia Checklist: Patient identified, Emergency Drugs available, Suction available and Patient being monitored Patient Re-evaluated:Patient Re-evaluated prior to induction Oxygen Delivery Method: Circle system utilized Preoxygenation: Pre-oxygenation with 100% oxygen Induction Type: IV induction Ventilation: Mask ventilation without difficulty LMA: LMA inserted LMA Size: 5.0 Number of attempts: 1 Airway Equipment and Method: Bite block Placement Confirmation: positive ETCO2 Tube secured with: Tape Dental Injury: Teeth and Oropharynx as per pre-operative assessment

## 2017-05-05 NOTE — Transfer of Care (Signed)
Immediate Anesthesia Transfer of Care Note  Patient: Angel [name]  Procedure(s) Performed: TRANSURETHRAL RESECTION OF THE PROSTATE (TURP) (N/A )  Patient Location: PACU  Anesthesia Type:General  Level of Consciousness: drowsy and responds to stimulation  Airway & Oxygen Therapy: Patient Spontanous Breathing and Patient connected to face mask oxygen  Post-op Assessment: Report given to RN  Post vital signs: Reviewed and stable  Last Vitals: 151/83, 97, 18, 100%, 98.3 Vitals:   05/05/17 0540  BP: 130/68  Pulse: 90  Resp: 18  Temp: 36.6 C  SpO2: 92%    Last Pain:  Vitals:   05/05/17 0540  TempSrc: Oral         Complications: No apparent anesthesia complications

## 2017-05-06 ENCOUNTER — Encounter (HOSPITAL_COMMUNITY): Payer: Self-pay | Admitting: Urology

## 2017-05-06 DIAGNOSIS — N401 Enlarged prostate with lower urinary tract symptoms: Secondary | ICD-10-CM | POA: Diagnosis not present

## 2017-05-06 LAB — GLUCOSE, CAPILLARY
Glucose-Capillary: 152 mg/dL — ABNORMAL HIGH (ref 65–99)
Glucose-Capillary: 99 mg/dL (ref 65–99)
Glucose-Capillary: 162 mg/dL — ABNORMAL HIGH (ref 65–99)
Glucose-Capillary: 79 mg/dL (ref 65–99)

## 2017-05-06 LAB — BASIC METABOLIC PANEL
Anion gap: 9 (ref 5–15)
BUN: 15 mg/dL (ref 6–20)
CO2: 37 mmol/L — ABNORMAL HIGH (ref 22–32)
Calcium: 8.5 mg/dL — ABNORMAL LOW (ref 8.9–10.3)
Chloride: 90 mmol/L — ABNORMAL LOW (ref 101–111)
Creatinine, Ser: 0.96 mg/dL (ref 0.61–1.24)
GFR calc Af Amer: >60 mL/min (ref >60)
GFR calc non Af Amer: >60 mL/min (ref >60)
Glucose, Bld: 156 mg/dL — ABNORMAL HIGH (ref 65–99)
Potassium: 3.8 mmol/L (ref 3.5–5.1)
Sodium: 136 mmol/L (ref 135–145)

## 2017-05-06 LAB — HEMOGLOBIN AND HEMATOCRIT, BLOOD
HCT: 34.6 % — ABNORMAL LOW (ref 39.0–52.0)
Hemoglobin: 10.8 g/dL — ABNORMAL LOW (ref 13.0–17.0)

## 2017-05-06 NOTE — Progress Notes (Signed)
1 Day Post-Op  Subjective: Angel [name] is POD #1 from a TURP.  He is doing well.  The urine is light pink but concentrated on slow CBI.   He had some genital pain last night but that has resolved.    ROS:  Review of Systems  Constitutional: Negative for chills and fever.  Respiratory: Negative for shortness of breath.   Cardiovascular: Negative for chest pain.    Anti-infectives: Anti-infectives    Start     Dose/Rate Route Frequency Ordered Stop   05/05/17 1400  ceFAZolin (ANCEF) IVPB 1 g/50 mL premix     1 g 100 mL/hr over 30 Minutes Intravenous Every 8 hours 05/05/17 1112     05/05/17 0800  gentamicin (GARAMYCIN) 340 mg in dextrose 5 % 100 mL IVPB  Status:  Discontinued     5 mg/kg  68 kg 108.5 mL/hr over 60 Minutes Intravenous  Once 05/05/17 0748 05/05/17 1246   05/05/17 0720  ceFAZolin (ANCEF) 2-4 GM/100ML-% IVPB    Comments:  Raelyn Number   : cabinet override      05/05/17 0720 05/05/17 0724   05/05/17 0615  ceFAZolin (ANCEF) IVPB 2g/100 mL premix     2 g 200 mL/hr over 30 Minutes Intravenous  Once 05/05/17 4098 05/05/17 0734      Current Facility-Administered Medications  Medication Dose Route Frequency Provider Last Rate Last Dose  . 0.45 % NaCl with KCl 20 mEq / L infusion   Intravenous Continuous Bjorn Pippin, MD 100 mL/hr at 05/06/17 0047    . acetaminophen (TYLENOL) tablet 650 mg  650 mg Oral Q4H PRN Bjorn Pippin, MD      . albuterol (PROVENTIL) (2.5 MG/3ML) 0.083% nebulizer solution 3 mL  3 mL Inhalation Q4H PRN Bjorn Pippin, MD      . atorvastatin (LIPITOR) tablet 40 mg  40 mg Oral q1800 Bjorn Pippin, MD   40 mg at 05/05/17 1851  . bisacodyl (DULCOLAX) suppository 10 mg  10 mg Rectal Daily PRN Bjorn Pippin, MD      . ceFAZolin (ANCEF) IVPB 1 g/50 mL premix  1 g Intravenous Q8H Bjorn Pippin, MD   Stopped at 05/06/17 0550  . diphenhydrAMINE (BENADRYL) injection 12.5 mg  12.5 mg Intravenous Q6H PRN Bjorn Pippin, MD       Or  . diphenhydrAMINE (BENADRYL) 12.5 MG/5ML elixir 12.5  mg  12.5 mg Oral Q6H PRN Bjorn Pippin, MD      . docusate sodium (COLACE) capsule 100 mg  100 mg Oral BID Bjorn Pippin, MD   100 mg at 05/05/17 2231  . dutasteride (AVODART) capsule 0.5 mg  0.5 mg Oral Daily Bjorn Pippin, MD      . furosemide (LASIX) tablet 40 mg  40 mg Oral Daily Bjorn Pippin, MD   40 mg at 05/05/17 1437  . glipiZIDE (GLUCOTROL) tablet 5 mg  5 mg Oral QAC supper Bjorn Pippin, MD       And  . metFORMIN (GLUCOPHAGE) tablet 500 mg  500 mg Oral QAC supper Bjorn Pippin, MD      . glipiZIDE (GLUCOTROL) tablet 7.5 mg  7.5 mg Oral QAC breakfast Bjorn Pippin, MD       And  . metFORMIN (GLUCOPHAGE) tablet 750 mg  750 mg Oral QAC breakfast Bjorn Pippin, MD      . HYDROmorphone (DILAUDID) injection 0.5-1 mg  0.5-1 mg Intravenous Q2H PRN Bjorn Pippin, MD   1 mg at 05/05/17 1851  . hyoscyamine (LEVSIN SL) SL tablet 0.125  mg  0.125 mg Sublingual Q4H PRN Bjorn PippinWrenn, Renn Stille, MD      . insulin aspart (novoLOG) injection 0-15 Units  0-15 Units Subcutaneous TID WC Bjorn PippinWrenn, Matej Sappenfield, MD      . nitroGLYCERIN (NITROSTAT) SL tablet 0.4 mg  0.4 mg Sublingual Q5 min PRN Bjorn PippinWrenn, Dezaray Shibuya, MD      . ondansetron [facility](ZOFRAN) injection 4 mg  4 mg Intravenous Q4H PRN Bjorn PippinWrenn, Ermin Parisien, MD      . oxyCODONE (Oxy IR/ROXICODONE) immediate release tablet 5 mg  5 mg Oral Q4H PRN Bjorn PippinWrenn, Shonte Beutler, MD      . potassium chloride SA (K-DUR,KLOR-CON) CR tablet 20 mEq  20 mEq Oral Daily Bjorn PippinWrenn, Demari Kropp, MD   20 mEq at 05/05/17 1914  . senna-docusate (Senokot-S) tablet 1 tablet  1 tablet Oral QHS PRN Bjorn PippinWrenn, Megean Fabio, MD      . sodium chloride irrigation 0.9 % 3,000 mL  3,000 mL Irrigation Continuous Bjorn PippinWrenn, Yadier Bramhall, MD   3,000 mL at 05/05/17 2231  . sodium phosphate (FLEET) 7-19 GM/118ML enema 1 enema  1 enema Rectal Once PRN Bjorn PippinWrenn, Skarlette Lattner, MD      . vitamin B-12 (CYANOCOBALAMIN) tablet 1,000 mcg  1,000 mcg Oral Daily Bjorn PippinWrenn, Nijah Tejera, MD   1,000 mcg at 05/05/17 1913  . zolpidem (AMBIEN) tablet 5 mg  5 mg Oral QHS PRN Bjorn PippinWrenn, Kathya Wilz, MD         Objective: Vital signs in last 24  hours: Temp:  [97.7 F (36.5 C)-98.7 F (37.1 C)] 98.2 F (36.8 C) (10/17 0610) Pulse Rate:  [72-113] 77 (10/17 0610) Resp:  [12-31] 18 (10/17 0610) BP: (95-151)/(42-83) 106/75 (10/17 0610) SpO2:  [91 %-100 %] 98 % (10/17 0610) Weight:  [68.9 kg (151 lb 14.4 oz)] 68.9 kg (151 lb 14.4 oz) (10/16 1300)  Intake/Output from previous day: 10/16 0701 - 10/17 0700 In: 5768.3 [P.O.:480; I.V.:2338.3; IV Piggyback:250] Out: 7800 [Urine:7700; Blood:100] Intake/Output this shift: No intake/output data recorded.   Physical Exam  Constitutional: He is well-developed, well-nourished, and in no distress.  Cardiovascular: Normal rate, regular rhythm and normal heart sounds.   Pulmonary/Chest: Effort normal and breath sounds normal. No respiratory distress.  Abdominal: Soft. There is no tenderness.  Vitals reviewed.   Lab Results:   Recent Labs  05/06/17 0424  HGB 10.8*  HCT 34.6*   BMET  Recent Labs  05/06/17 0424  NA 136  K 3.8  CL 90*  CO2 37*  GLUCOSE 156*  BUN 15  CREATININE 0.96  CALCIUM 8.5*   PT/INR No results for input(s): LABPROT, INR in the last 72 hours. ABG No results for input(s): PHART, HCO3 in the last 72 hours.  Invalid input(s): PCO2, PO2  Studies/Results: No results found.   Assessment and Plan: BPH with BOO with failed laser TUR now s/p TURP.   He is doing well.  Heplock IV and D/C foley in am.        LOS: 0 days    Anner CreteWRENN,Litha Lamartina J 05/06/2017 [phone] ID: Angel [name], male   DOB: [birth year], 81 y.o.   MRN: [id number]

## 2017-05-06 NOTE — Plan of Care (Signed)
Problem: Activity: Goal: Mobility will improve Outcome: Progressing Ambulating in halls w/ assist, up in chair.   Problem: Nutrition: Goal: Ability to attain and maintain optimal nutritional status will improve Outcome: Progressing Encourage PO, poor appetite.

## 2017-05-07 DIAGNOSIS — N401 Enlarged prostate with lower urinary tract symptoms: Secondary | ICD-10-CM | POA: Diagnosis not present

## 2017-05-07 LAB — GLUCOSE, CAPILLARY
Glucose-Capillary: 131 mg/dL — ABNORMAL HIGH (ref 65–99)
Glucose-Capillary: 164 mg/dL — ABNORMAL HIGH (ref 65–99)
Glucose-Capillary: 102 mg/dL — ABNORMAL HIGH (ref 65–99)

## 2017-05-07 NOTE — Discharge Instructions (Signed)
Transurethral Resection of the Prostate, Care After Refer to this sheet in the next few weeks. These instructions provide you with information about caring for yourself after your procedure. Your health care provider may also give you more specific instructions. Your treatment has been planned according to current medical practices, but problems sometimes occur. Call your health care provider if you have any problems or questions after your procedure. What can I expect after the procedure? After the procedure, it is common to have:  Mild pain in your lower abdomen.  Soreness or mild discomfort in your penis from having the catheter inserted during the procedure.  A feeling of urgency when you need to urinate.  A small amount of blood in your urine. You may notice some small blood clots in your urine. These are normal.  Follow these instructions at home: Medicines   Take over-the-counter and prescription medicines only as told by your health care provider.  Do not drive or operate heavy machinery while taking prescription pain medicine.  Do not drive for 24 hours if you received a sedative.  If you were prescribed antibiotic medicine, take it as told by your health care provider. Do not stop taking the antibiotic even if you start to feel better. Activity  Return to your normal activities as told by your health care provider. Ask your health care provider what activities are safe for you.  Do not lift anything that is heavier than 10 lb (4.5 kg) for 3 weeks after your procedure, or as long as told by your health care provider.  Avoid intense physical activity for as long as told by your health care provider.  Walk at least one time every day. This helps to prevent blood clots. You may increase your physical activity gradually as you start to feel better. Lifestyle  Do not drink alcohol for as long as told by your health care provider. This is especially important if you are taking  prescription pain medicines.  Do not engage in sexual activity until your health care provider says that you can do this. General instructions  Do not take baths, swim, or use a hot tub until your health care provider approves.  Drink enough fluid to keep your urine clear or pale yellow.  Urinate as soon as you feel the need to. Do not try to hold your urine for long periods of time.  If your health care provider approves, you may take a stool softener for 2-3 weeks to prevent you from straining to have a bowel movement.  Wear compression stockings as told by your health care provider. These stockings help to prevent blood clots and reduce swelling in your legs.  Keep all follow-up visits as told by your health care provider. This is important. Contact a health care provider if:  You have difficulty urinating.  You have a fever.  You have pain that gets worse or does not improve with medicine.  You have blood in your urine that does not go away after 1 week of resting and drinking more fluids.  You have swelling in your penis or testicles. Get help right away if:  You are unable to urinate.  You are having more blood clots in your urine instead of fewer.  You have: ? Large blood clots. ? A lot of blood in your urine. ? Pain in your back or lower abdomen. ? Pain or swelling in your legs. ? Chills and you are shaking.  You may resume the 81mg   aspirin but hold the plavix for 1 week.   This information is not intended to replace advice given to you by your health care provider. Make sure you discuss any questions you have with your health care provider. Document Released: 07/07/2005 Document Revised: 03/09/2016 Document Reviewed: 03/29/2015 Elsevier Interactive Patient Education  2017 [employer].

## 2017-05-07 NOTE — Progress Notes (Signed)
D/C instructions reviewed w/ pt, wife, and dtr. All verbalize understanding and all questions answered. Pt wife in possession of d/c packet and all personal belongings. Pt dressing then will d/c in w/c by NT to dtr's car in stable condition.

## 2017-05-07 NOTE — Discharge Summary (Signed)
Physician Discharge Summary  Patient ID: Angel [name] MRN: [id number] DOB/AGE: 79-20-1937 79 y.o.  Admit date: 05/05/2017 Discharge date: 05/07/2017  Admission Diagnoses:  BPH with obstruction/lower urinary tract symptoms  Discharge Diagnoses:  Principal Problem:   BPH with obstruction/lower urinary tract symptoms   Past Medical History:  Diagnosis Date  . Arthritis   . Benign prostatic hypertrophy   . Coronary artery disease    status post PTCA and stenting of the left anterior descending artery  . Diabetes mellitus   . Dyspnea    with exertion   . Hyperlipidemia   . Hypertension   . Paralyzed hemidiaphragm    Left  . Pneumonia    50 years ago    Surgeries: Procedure(s): TRANSURETHRAL RESECTION OF THE PROSTATE (TURP) on 05/05/2017   Consultants (if any):   Discharged Condition: Improved  Hospital Course: Angel [name] is an 81 y.o. male who was admitted 05/05/2017 with a diagnosis of BPH with obstruction/lower urinary tract symptoms despite prior Thulium laser prostate ablation and went to the operating room on 05/05/2017 and underwent the above named procedures.  He did well post op.  The foley was removed this morning and he is voiding well with pink urine.   He is ready for discharge.   He was given perioperative antibiotics:  Anti-infectives    Start     Dose/Rate Route Frequency Ordered Stop   05/05/17 1400  ceFAZolin (ANCEF) IVPB 1 g/50 mL premix     1 g 100 mL/hr over 30 Minutes Intravenous Every 8 hours 05/05/17 1112     05/05/17 0800  gentamicin (GARAMYCIN) 340 mg in dextrose 5 % 100 mL IVPB  Status:  Discontinued     5 mg/kg  68 kg 108.5 mL/hr over 60 Minutes Intravenous  Once 05/05/17 0748 05/05/17 1246   05/05/17 0720  ceFAZolin (ANCEF) 2-4 GM/100ML-% IVPB    Comments:  Raelyn Number   : cabinet override      05/05/17 0720 05/05/17 0724   05/05/17 0615  ceFAZolin (ANCEF) IVPB 2g/100 mL premix     2 g 200 mL/hr over 30 Minutes Intravenous  Once  05/05/17 0607 05/05/17 0734    .  He was given sequential compression devices for DVT prophylaxis.  He benefited maximally from the hospital stay and there were no complications.    Recent vital signs:  Vitals:   05/06/17 2247 05/07/17 0542  BP: 108/60 (!) 110/56  Pulse: 84 84  Resp: 16 16  Temp: 98.4 F (36.9 C) 98 F (36.7 C)  SpO2: 92% 91%    Recent laboratory studies:  Lab Results  Component Value Date   HGB 10.8 (L) 05/06/2017   HGB 12.8 (L) 05/01/2017   HGB 10.2 (L) 10/27/2016   Lab Results  Component Value Date   WBC 12.3 (H) 05/01/2017   PLT 292 05/01/2017   Lab Results  Component Value Date   INR 1.20 08/12/2016   Lab Results  Component Value Date   NA 136 05/06/2017   K 3.8 05/06/2017   CL 90 (L) 05/06/2017   CO2 37 (H) 05/06/2017   BUN 15 05/06/2017   CREATININE 0.96 05/06/2017   GLUCOSE 156 (H) 05/06/2017    Discharge Medications:   Allergies as of 05/07/2017   No Known Allergies     Medication List    STOP taking these medications   dutasteride 0.5 MG capsule Commonly known as:  AVODART   tamsulosin 0.4 MG Caps capsule Commonly known as:  FLOMAX     TAKE these medications   albuterol 108 (90 Base) MCG/ACT inhaler Commonly known as:  PROVENTIL HFA;VENTOLIN HFA Inhale 1-2 puffs into the lungs every 4 (four) hours as needed for wheezing or shortness of breath.   aspirin 81 MG tablet Take 81 mg by mouth at bedtime.   atorvastatin 40 MG tablet Commonly known as:  LIPITOR Take 40 mg by mouth daily at 6 PM.   clopidogrel 75 MG tablet Commonly known as:  PLAVIX Take 75 mg by mouth at bedtime.   furosemide 40 MG tablet Commonly known as:  LASIX Take 1 tablet by mouth daily.   glipiZIDE-metformin 5-500 MG tablet Commonly known as:  METAGLIP Take 1-1.5 tablets by mouth 2 (two) times daily before a meal. Patient takes 1.5 tablet by mouth in the morning and a 1 tablet by mouth at night.   nitroGLYCERIN 0.4 MG SL tablet Commonly  known as:  NITROSTAT Place 0.4 mg under the tongue every 5 (five) minutes as needed for chest pain.   orphenadrine 100 MG tablet Commonly known as:  NORFLEX Take 100 mg by mouth 2 (two) times daily as needed for muscle spasms.   potassium chloride SA 20 MEQ tablet Commonly known as:  K-DUR,KLOR-CON Take 1 tablet by mouth daily.   vitamin B-12 1000 MCG tablet Commonly known as:  CYANOCOBALAMIN Take 1,000 mcg by mouth daily.       Diagnostic Studies: Dg Chest 2 View  Result Date: 05/01/2017 CLINICAL DATA:  Preop for bladder surgery. EXAM: CHEST  2 VIEW COMPARISON:  Radiograph of August 19, 2016. FINDINGS: Stable cardiomediastinal silhouette. Stable severe elevation of left hemidiaphragm is noted with mild left basilar subsegmental atelectasis. Right lung is clear. Atherosclerosis of thoracic aorta is noted. No pneumothorax or significant pleural effusion is noted. Bony thorax is unremarkable. IMPRESSION: Stable elevation left hemidiaphragm with mild associated left basilar subsegmental atelectasis. Aortic atherosclerosis. Electronically Signed   By: Lupita RaiderJames  Green Jr, M.D.   On: 05/01/2017 09:56    Disposition: 01-Home or Self Care    Follow-up Information    Jetta LoutWarden, Diane, NP Follow up on 05/19/2017.   Specialty:  Urology Why:  [address] Contact information: [address] [place] [address] [phone]            Signed: Anner CreteWRENN,Marquest Gunkel J 05/07/2017, 8:38 AM

## 2017-05-30 ENCOUNTER — Encounter (HOSPITAL_COMMUNITY): Payer: Self-pay

## 2017-05-30 ENCOUNTER — Emergency Department (HOSPITAL_COMMUNITY)
Admission: EM | Admit: 2017-05-30 | Discharge: 2017-05-30 | Disposition: A | Discharge status: Home / Self Care | Payer: 59 | Attending: Emergency Medicine | Admitting: Emergency Medicine

## 2017-05-30 DIAGNOSIS — Z7982 Long term (current) use of aspirin: Secondary | ICD-10-CM | POA: Diagnosis not present

## 2017-05-30 DIAGNOSIS — R319 Hematuria, unspecified: Secondary | ICD-10-CM

## 2017-05-30 DIAGNOSIS — Z7902 Long term (current) use of antithrombotics/antiplatelets: Secondary | ICD-10-CM | POA: Diagnosis not present

## 2017-05-30 DIAGNOSIS — I2510 Atherosclerotic heart disease of native coronary artery without angina pectoris: Secondary | ICD-10-CM | POA: Diagnosis not present

## 2017-05-30 DIAGNOSIS — I110 Hypertensive heart disease with heart failure: Secondary | ICD-10-CM | POA: Diagnosis not present

## 2017-05-30 DIAGNOSIS — E119 Type 2 diabetes mellitus without complications: Secondary | ICD-10-CM | POA: Insufficient documentation

## 2017-05-30 DIAGNOSIS — Z79899 Other long term (current) drug therapy: Secondary | ICD-10-CM | POA: Diagnosis not present

## 2017-05-30 DIAGNOSIS — I5032 Chronic diastolic (congestive) heart failure: Secondary | ICD-10-CM | POA: Insufficient documentation

## 2017-05-30 HISTORY — DX: Benign prostatic hyperplasia without lower urinary tract symptoms: N40.0

## 2017-05-30 LAB — URINALYSIS, MICROSCOPIC (REFLEX)

## 2017-05-30 LAB — URINALYSIS, ROUTINE W REFLEX MICROSCOPIC
Bilirubin Urine: NEGATIVE
Glucose, UA: 250 mg/dL — AB
Ketones, ur: NEGATIVE mg/dL
Leukocytes, UA: NEGATIVE
Nitrite: NEGATIVE
Protein, ur: >300 mg/dL — AB
Specific Gravity, Urine: 1.02 (ref 1.005–1.030)
pH: 7.5 (ref 5.0–8.0)

## 2017-05-30 MED ORDER — CEPHALEXIN 500 MG PO CAPS: 500.0000 mg | ORAL_CAPSULE | Freq: Three times a day (TID) | ORAL | 0 refills | Status: DC

## 2017-05-30 NOTE — ED Triage Notes (Signed)
States unable to urinate since 1400 pm today and since 1700 pm noticed hematuria no fever or n/v voiced.

## 2017-06-02 LAB — URINE CULTURE: Culture: 70000 — AB

## 2017-06-03 ENCOUNTER — Telehealth (HOSPITAL_BASED_OUTPATIENT_CLINIC_OR_DEPARTMENT_OTHER): Payer: Self-pay

## 2017-06-03 ENCOUNTER — Telehealth: Payer: Self-pay | Admitting: Cardiovascular Disease

## 2017-06-03 NOTE — Telephone Encounter (Signed)
New Message      Pt c/o medication issue:  1. Name of Medication: plavix   2. How are you currently taking this medication (dosage and times per day)? 75 mg at bedtime   3. Are you having a reaction (difficulty breathing--STAT)? yes 4. What is your medication issue? Blood in urine , went to hospital Saturday and they stopped it

## 2017-06-03 NOTE — Telephone Encounter (Signed)
He has a remote coronary stent.  It is okay to hold the Plavix.  In fact it is okay to discontinue the Plavix since it has been years since his PCI.   I would hold the Plavix until Dr.  Annabell HowellsWrenn  is satisfied that the hematuria has resolved.

## 2017-06-03 NOTE — Telephone Encounter (Addendum)
Patient's wife (DPR) is calling and wanting to know how long can patient stay off his Plavix. She stated patient was told to stay off of Plavix for a couple of days. Patient went to see Dr. Annabell HowellsWrenn yesterday and he referred patient to talk with his cardiologist on how long he should hold the Plavix. Will forward to Dr. Elease HashimotoNahser to see how long is it okay to stay off Plavix. Patient's wife stated right now patient does not have any blood in his urine.

## 2017-06-03 NOTE — Progress Notes (Signed)
ED Antimicrobial Stewardship Positive Culture Follow Up   Angel MarketLuis A Reisen is an 81 y.o. male who [facility] with a chief complaint of  Chief Complaint  Patient presents with  . Urinary Retention    Recent Results (from the past 720 hour(s))  Urine culture     Status: Abnormal   Collection Time: 05/30/17  8:59 PM  Result Value Ref Range Status   Specimen Description URINE, RANDOM  Final   Special Requests NONE  Final   Culture 70,000 COLONIES/mL ENTEROBACTER SPECIES (A)  Final   Report Status 06/02/2017 FINAL  Final   Organism ID, Bacteria ENTEROBACTER SPECIES (A)  Final      Susceptibility   Enterobacter species - MIC*    CEFAZOLIN >=64 RESISTANT Resistant     CEFTRIAXONE 16 INTERMEDIATE Intermediate     CIPROFLOXACIN <=0.25 SENSITIVE Sensitive     GENTAMICIN <=1 SENSITIVE Sensitive     IMIPENEM <=0.25 SENSITIVE Sensitive     NITROFURANTOIN 32 SENSITIVE Sensitive     TRIMETH/SULFA <=20 SENSITIVE Sensitive     PIP/TAZO >=128 RESISTANT Resistant     * 70,000 COLONIES/mL ENTEROBACTER SPECIES    [x]  Treated with Keflex, organism resistant to prescribed antimicrobial  Patient to be called for symptom check and to assess follow up with urologist (Dr. Annabell HowellsWrenn). Patient should follow up with PCP or Dr. Annabell HowellsWrenn within 1 week to assess for prostatitis.   New antibiotic prescription: If dysuria or frequency present, discontinue Keflex, initiate Bactrim DS twice a day x 7 days. If no symptoms, still ensure follow up with urologist or PCP.   ED Provider: Particia LatherMichael Maczis   Kema Santaella N Ka Flammer, PharmD PGY1 Pharmacy Resident ID pharmacist phone # [phone] 06/03/17 9:39 AM

## 2017-06-03 NOTE — Telephone Encounter (Signed)
Left message for patient to call back  

## 2017-06-03 NOTE — Telephone Encounter (Signed)
Post ED Visit - Positive Culture Follow-up: Successful Patient Follow-Up  Culture assessed and recommendations reviewed by: []  Enzo BiNathan Batchelder, Pharm.D. []  Celedonio MiyamotoJeremy Frens, Pharm.D., BCPS AQ-ID []  Garvin FilaMike Maccia, Pharm.D., BCPS []  Georgina PillionElizabeth Martin, [address]Ph[address]D., BCPS []  St. VincentMinh Pham, [address]Pharm[address]D., BCPS, AAHIVP []  Estella HuskMichelle Turner, Pharm.D., BCPS, AAHIVP []  Lysle Pearlachel Rumbarger, PharmD, BCPS []  Casilda Carlsaylor Stone, PharmD, BCPS []  Pollyann SamplesAndy Johnston, PharmD, BCPS  Positive urine culture, 70,000 colonies -> enterobacter species  []  Patient discharged without antimicrobial prescription and treatment is now indicated [x]  Organism is resistant to prescribed ED discharge antimicrobial []  Patient with positive blood cultures  Changes discussed with ED provider: Leary RocaMichael Maczis PA Call for symptom check.  If dysuria, frequency or systemic symptoms start Bactrim DS BID x 7 days.  Assess follow up w/urologist Dr Ocie CornfieldWrenor PCP.  Needs to follow up within 1 week w/Dr Annabell HowellsWrenn or PCP.  New antibiotic prescription Called to  Contacted patient, date 06/03/17, time 1300 Left message w/pts wife Valentina GuLucy to have pt return call.   Arvid RightClark, Burt Piatek Dorn 06/03/2017, 1:03 PM

## 2017-06-04 NOTE — Telephone Encounter (Signed)
Reviewed Dr. Harvie BridgeNahser's advice with patient's EC, Angel [name]. She verbalized understanding and thanked me for the call.

## 2017-06-10 NOTE — ED Provider Notes (Signed)
[facility]-EMERGENCY DEPT Provider Note   CSN: [id number] Arrival date & time: 05/30/17  1846     History   Chief Complaint Chief Complaint  Patient presents with  . Urinary Retention    HPI Angel [name] is a 81 y.o. male.  HPI   81 year old male with urinary.  History of BPH.  Status post TURP about 4 weeks ago.  Had been voiding okay in the past couple days.  Now with hematuria and has not voided in several hours.  He is having increasing lower abdominal pain.  Past Medical History:  Diagnosis Date  . Arthritis   . Benign prostatic hypertrophy   . BPH (benign prostatic hyperplasia)   . Coronary artery disease    status post PTCA and stenting of the left anterior descending artery  . Diabetes mellitus   . Dyspnea    with exertion   . Hyperlipidemia   . Hypertension   . Paralyzed hemidiaphragm    Left  . Pneumonia    50 years ago    Patient Active Problem List   Diagnosis Date Noted  . BPH with obstruction/lower urinary tract symptoms 05/05/2017  . Nonrheumatic aortic valve stenosis 10/03/2016  . Chronic diastolic CHF (congestive heart failure) (HCC) 10/03/2016  . Acute bronchitis   . Diaphragm paralysis   . Acute encephalopathy   . Influenza with pneumonia   . Acute urinary retention   . Chest pain on breathing   . Acute on chronic respiratory failure with hypoxemia (HCC)   . Acute respiratory failure with hypoxia (HCC) 08/12/2016  . Influenza 08/12/2016  . Hypertensive heart disease without heart failure   . Demand ischemia (HCC)   . S/P coronary artery stent placement   . COPD exacerbation (HCC) 08/11/2016  . Diabetes mellitus (HCC) 10/06/2011  . HTN (hypertension) 10/06/2011  . Hyperlipidemia 10/06/2011  . CAD (coronary artery disease) 04/07/2011    Past Surgical History:  Procedure Laterality Date  . THULIUM LASER TURP (TRANSURETHRAL RESECTION OF PROSTATE) N/A 10/30/2016   Procedure: THULIUM LASER TURP (TRANSURETHRAL  RESECTION OF PROSTATE);  Surgeon: Bjorn PippinJohn Wrenn, MD;  Location: [facility];  Service: Urology;  Laterality: N/A;  . TRANSURETHRAL RESECTION OF PROSTATE N/A 05/05/2017   Procedure: TRANSURETHRAL RESECTION OF THE PROSTATE (TURP);  Surgeon: Bjorn PippinWrenn, John, MD;  Location: [facility];  Service: Urology;  Laterality: N/A;  NEEDS 60 MIN FOR PROCEDURE       Home Medications    Prior to Admission medications   Medication Sig Start Date End Date Taking? Authorizing Provider  albuterol (PROVENTIL HFA;VENTOLIN HFA) 108 (90 Base) MCG/ACT inhaler Inhale 1-2 puffs into the lungs every 4 (four) hours as needed for wheezing or shortness of breath.    [provider]  aspirin 81 MG tablet Take 81 mg by mouth at bedtime.     [provider]  atorvastatin (LIPITOR) 40 MG tablet Take 40 mg by mouth daily at 6 PM.    [provider]  cephALEXin (KEFLEX) 500 MG capsule Take 1 capsule (500 mg total) 3 (three) times daily by mouth. 05/30/17   Raeford RazorKohut, Wiley Magan, MD  clopidogrel (PLAVIX) 75 MG tablet Take 75 mg by mouth at bedtime. 07/28/16   [provider]  furosemide (LASIX) 40 MG tablet Take 1 tablet by mouth daily. 03/31/17   [provider]  glipiZIDE-metformin (METAGLIP) 5-500 MG tablet Take 1-1.5 tablets by mouth 2 (two) times daily before a meal. Patient takes 1.5 tablet by mouth in the morning  and a 1 tablet by mouth at night.    [provider]  nitroGLYCERIN (NITROSTAT) 0.4 MG SL tablet Place 0.4 mg under the tongue every 5 (five) minutes as needed for chest pain.    [provider]  orphenadrine (NORFLEX) 100 MG tablet Take 100 mg by mouth 2 (two) times daily as needed for muscle spasms.    [provider]  potassium chloride SA (K-DUR,KLOR-CON) 20 MEQ tablet Take 1 tablet by mouth daily. 03/31/17   [provider]  vitamin B-12 (CYANOCOBALAMIN) 1000 MCG tablet Take 1,000 mcg by mouth daily.    [provider]    Family History Family  History  Problem Relation Age of Onset  . Cancer Father   . Cancer Brother     Social History Social History   Tobacco Use  . Smoking status: Never Smoker  . Smokeless tobacco: Never Used  Substance Use Topics  . Alcohol use: No  . Drug use: No     Allergies   Patient has no known allergies.   Review of Systems Review of Systems  All systems reviewed and negative, other than as noted in HPI.  Physical Exam Updated Vital Signs BP 106/64 (BP Location: Left Arm)   Pulse (!) 101   Temp 97.7 F (36.5 C) (Oral)   Resp 20   Ht 5\' 8"  (1.727 m)   Wt 68.5 kg (151 lb)   SpO2 96%   BMI 22.96 kg/m   Physical Exam  Constitutional: He appears well-developed and well-nourished. No distress.  I assessed patient once his Foley catheter had already been placed.  He appears reasonably comfortable.  Gross hematuria with some small clots noted.  Abdomen is soft.  Nonpalpable bladder.  He does have some minimal suprapubic tenderness though.  HENT:  Head: Normocephalic and atraumatic.  Eyes: Conjunctivae are normal. Right eye exhibits no discharge. Left eye exhibits no discharge.  Neck: Neck supple.  Cardiovascular: Normal rate, regular rhythm and normal heart sounds. Exam reveals no gallop and no friction rub.  No murmur heard. Pulmonary/Chest: Effort normal and breath sounds normal. No respiratory distress.  Abdominal: Soft. He exhibits no distension. There is no tenderness.  Musculoskeletal: He exhibits no edema or tenderness.  Neurological: He is alert.  Skin: Skin is warm and dry.  Psychiatric: He has a normal mood and affect. His behavior is normal. Thought content normal.  Nursing note and vitals reviewed.    ED Treatments / Results  Labs (all labs ordered are listed, but only abnormal results are displayed) Labs Reviewed  URINE CULTURE - Abnormal; Notable for the following components:      Result Value   Culture 70,000 COLONIES/mL ENTEROBACTER SPECIES (*)    Organism  ID, Bacteria ENTEROBACTER SPECIES (*)    All other components within normal limits  URINALYSIS, ROUTINE W REFLEX MICROSCOPIC - Abnormal; Notable for the following components:   Color, Urine RED (*)    APPearance TURBID (*)    Glucose, UA 250 (*)    Hgb urine dipstick LARGE (*)    Protein, ur >300 (*)    All other components within normal limits  URINALYSIS, MICROSCOPIC (REFLEX) - Abnormal; Notable for the following components:   Bacteria, UA FEW (*)    Squamous Epithelial / LPF 6-30 (*)    All other components within normal limits    EKG  EKG Interpretation None       Radiology No results found.  Procedures Procedures (including critical care time)  Medications Ordered in ED Medications - No data to display   Initial Impression / Assessment and Plan / ED Course  I have reviewed the triage vital signs and the nursing notes.  Pertinent labs & imaging results that were available during my care of the patient were reviewed by me and considered in my medical decision making (see chart for details).     81 year old male with acute urinary retention.  Foley catheter was placed with market improvement of symptoms.  Hematuria with some clot noted.  He is on Plavix.  He has a history of a remote stent placement.  I think it is reasonable to hold the Plavix for a couple days.  Will be started on antibiotics for possible UTI.  Is to follow-up with his urologist.  Final Clinical Impressions(s) / ED Diagnoses   Final diagnoses:  Hematuria, unspecified type    ED Discharge Orders        Ordered    cephALEXin (KEFLEX) 500 MG capsule  3 times daily     05/30/17 2204       Raeford Razor, MD 06/10/17 [phone]

## 2017-06-15 ENCOUNTER — Telehealth: Payer: Self-pay | Admitting: Emergency Medicine

## 2017-06-15 NOTE — Telephone Encounter (Signed)
Lost to followup 

## 2017-07-06 DIAGNOSIS — N182 Chronic kidney disease, stage 2 (mild): Secondary | ICD-10-CM | POA: Diagnosis not present

## 2017-07-06 DIAGNOSIS — I10 Essential (primary) hypertension: Secondary | ICD-10-CM | POA: Diagnosis not present

## 2017-07-06 DIAGNOSIS — E1151 Type 2 diabetes mellitus with diabetic peripheral angiopathy without gangrene: Secondary | ICD-10-CM | POA: Diagnosis not present

## 2017-07-06 DIAGNOSIS — E785 Hyperlipidemia, unspecified: Secondary | ICD-10-CM | POA: Diagnosis not present

## 2017-07-06 DIAGNOSIS — I2510 Atherosclerotic heart disease of native coronary artery without angina pectoris: Secondary | ICD-10-CM | POA: Diagnosis not present

## 2017-07-07 ENCOUNTER — Encounter: Payer: Self-pay | Admitting: Cardiovascular Disease

## 2017-07-07 ENCOUNTER — Ambulatory Visit (INDEPENDENT_AMBULATORY_CARE_PROVIDER_SITE_OTHER): Payer: BLUE CROSS/BLUE SHIELD | Admitting: Cardiovascular Disease

## 2017-07-07 VITALS — BP 110/62 | HR 86 | Ht 65.0 in | Wt 147.0 lb

## 2017-07-07 DIAGNOSIS — I2510 Atherosclerotic heart disease of native coronary artery without angina pectoris: Secondary | ICD-10-CM | POA: Diagnosis not present

## 2017-07-07 NOTE — Progress Notes (Signed)
Angel MarketLuis A [name] Date of Birth  10/30/1935    [address]    [address]    [place], [place]  [address]       Problems: 1. Coronary artery disease-status post PTCA and stenting of the LAD 2. Paralyzed left hemidiaphragm 3. Diabetes mellitus 4. Hypertension 5. Hyperlipidemia  History of Present Illness:  Angel [name] is a 81 yo with the above noted hx.  No chest pain.  He remains active .  He's not had any episodes of chest pain or shortness breath. He still exercises on an intermittent basis.  Nov 29, 2012:  Angel [name] is doing well. No CP,  Breathing is the same- slightly short of breath.  He is still working 4 days a week.  He has put in a garden this spring - has tomatoes, peppers, etc.   Nov. 10, 2014:  Angel [name] is doing well.  No angina.    Dec 05, 2013:  Angel [name] is doing well.  No angina.  Staying active.    January 09, 2015:  Doing well, no episodes of angina. He does all of his normal activities.  Still gardening .    February 04, 2016:  Still working,   Still has his garden . No CP , Breathing is stable   October 03, 2016:  Angel [name] is seen today for follow up visit .  Just getting over the flu and pneumonia. Spent 10 days in hospital.   He has a paralized left hemi - diaphragm and feels about like his normal state.   Saw pulmonary a month ago  - O2 sats were 96% at that time .   No angina .   Back doing his normal activities    January 05, 2017:  Angel [name] is seen today. No cardiac issues  BP is a little low, No dizziness.   July 07, 2017:  Angel [name] is seen today for follow up visit of his CAd Remaining active.  No Angina   Current Outpatient Medications on File Prior to Visit  Medication Sig Dispense Refill  . albuterol (PROVENTIL HFA;VENTOLIN HFA) 108 (90 Base) MCG/ACT inhaler Inhale 1-2 puffs into the lungs every 4 (four) hours as needed for wheezing or shortness of breath.    Marland Kitchen. aspirin 81 MG tablet Take 81 mg by mouth at bedtime.     Marland Kitchen. atorvastatin (LIPITOR) 40 MG tablet  Take 40 mg by mouth daily at 6 PM.    . clopidogrel (PLAVIX) 75 MG tablet Take 75 mg by mouth at bedtime.    . furosemide (LASIX) 40 MG tablet Take 1 tablet by mouth daily.  1  . glipiZIDE-metformin (METAGLIP) 5-500 MG tablet Take 1-1.5 tablets by mouth 2 (two) times daily before a meal. Patient takes 1.5 tablet by mouth in the morning and a 1 tablet by mouth at night.    . nitroGLYCERIN (NITROSTAT) 0.4 MG SL tablet Place 0.4 mg under the tongue every 5 (five) minutes as needed for chest pain.    . orphenadrine (NORFLEX) 100 MG tablet Take 100 mg by mouth 2 (two) times daily as needed for muscle spasms.    . potassium chloride SA (K-DUR,KLOR-CON) 20 MEQ tablet Take 1 tablet by mouth daily.  3   No current facility-administered medications on file prior to visit.     No Known Allergies  Past Medical History:  Diagnosis Date  . Arthritis   . Benign prostatic hypertrophy   . BPH (benign prostatic hyperplasia)   . Coronary  artery disease    status post PTCA and stenting of the left anterior descending artery  . Diabetes mellitus   . Dyspnea    with exertion   . Hyperlipidemia   . Hypertension   . Paralyzed hemidiaphragm    Left  . Pneumonia    50 years ago    Past Surgical History:  Procedure Laterality Date  . THULIUM LASER TURP (TRANSURETHRAL RESECTION OF PROSTATE) N/A 10/30/2016   Procedure: THULIUM LASER TURP (TRANSURETHRAL RESECTION OF PROSTATE);  Surgeon: Bjorn PippinJohn Wrenn, MD;  Location: [facility];  Service: Urology;  Laterality: N/A;  . TRANSURETHRAL RESECTION OF PROSTATE N/A 05/05/2017   Procedure: TRANSURETHRAL RESECTION OF THE PROSTATE (TURP);  Surgeon: Bjorn PippinWrenn, John, MD;  Location: [facility];  Service: Urology;  Laterality: N/A;  NEEDS 60 MIN FOR PROCEDURE    Social History   Tobacco Use  Smoking Status Never Smoker  Smokeless Tobacco Never Used    Social History   Substance and Sexual Activity  Alcohol Use No    Family History  Problem Relation Age of Onset  . Cancer  Father   . Cancer Brother     Reviw of Systems:  Reviewed in the HPI.  All other systems are negative.  Physical Exam: Blood pressure 110/62, pulse 86, height 5\' 5"  (1.651 m), weight 147 lb (66.7 kg), SpO2 92 %.  GEN:  Well nourished, well developed in no acute distress HEENT: Normal NECK: No JVD; No carotid bruits LYMPHATICS: No lymphadenopathy CARDIAC: RR, soft systolic murmur no  rubs, gallops RESPIRATORY: He has no breath sounds on the left side.  Normal breath sounds on right ABDOMEN: Soft, non-tender, non-distended MUSCULOSKELETAL:  No edema; No deformity  SKIN: Warm and dry NEUROLOGIC:  Alert and oriented x 3  ECG: July 07, 2017: Normal sinus rhythm at 86.  Minimal voltage for left ventricular hypertrophy.  There are no significant changes from previous EKG.    Assessment / Plan:   1. Coronary artery disease-status post PTCA and stenting of the LAD -    Louis is doing very well.  Is not having any angina.  2. Paralyzed left hemidiaphragm -stable  3. Diabetes mellitus 4. Hypertension -    5. Hyperlipidemia  -we will check fasting labs at his next visit in 6 months.  7. Chronic diastolic CHF :        Kristeen MissPhilip Nora Sabey, MD  07/07/2017 11:52 AM    [facility] [address],  [address] [place], [place]  [address] Pager [phone] Phone: [phone]; Fax: [phone]

## 2017-07-07 NOTE — Patient Instructions (Signed)

## 2017-07-12 ENCOUNTER — Emergency Department (HOSPITAL_COMMUNITY): Payer: BLUE CROSS/BLUE SHIELD

## 2017-07-12 ENCOUNTER — Emergency Department (HOSPITAL_COMMUNITY)
Admission: EM | Admit: 2017-07-12 | Discharge: 2017-07-12 | Disposition: A | Discharge status: Home / Self Care | Payer: BLUE CROSS/BLUE SHIELD | Attending: Emergency Medicine | Admitting: Emergency Medicine

## 2017-07-12 ENCOUNTER — Encounter (HOSPITAL_COMMUNITY): Payer: Self-pay | Admitting: Emergency Medicine

## 2017-07-12 DIAGNOSIS — S0101XA Laceration without foreign body of scalp, initial encounter: Secondary | ICD-10-CM | POA: Insufficient documentation

## 2017-07-12 DIAGNOSIS — I10 Essential (primary) hypertension: Secondary | ICD-10-CM | POA: Diagnosis not present

## 2017-07-12 DIAGNOSIS — S0181XA Laceration without foreign body of other part of head, initial encounter: Secondary | ICD-10-CM | POA: Insufficient documentation

## 2017-07-12 DIAGNOSIS — E119 Type 2 diabetes mellitus without complications: Secondary | ICD-10-CM | POA: Insufficient documentation

## 2017-07-12 DIAGNOSIS — M899 Disorder of bone, unspecified: Secondary | ICD-10-CM | POA: Diagnosis not present

## 2017-07-12 DIAGNOSIS — Z7984 Long term (current) use of oral hypoglycemic drugs: Secondary | ICD-10-CM | POA: Insufficient documentation

## 2017-07-12 DIAGNOSIS — Y999 Unspecified external cause status: Secondary | ICD-10-CM | POA: Diagnosis not present

## 2017-07-12 DIAGNOSIS — Z7982 Long term (current) use of aspirin: Secondary | ICD-10-CM | POA: Diagnosis not present

## 2017-07-12 DIAGNOSIS — Z79899 Other long term (current) drug therapy: Secondary | ICD-10-CM | POA: Diagnosis not present

## 2017-07-12 DIAGNOSIS — Y9301 Activity, walking, marching and hiking: Secondary | ICD-10-CM | POA: Insufficient documentation

## 2017-07-12 DIAGNOSIS — Z7901 Long term (current) use of anticoagulants: Secondary | ICD-10-CM | POA: Diagnosis not present

## 2017-07-12 DIAGNOSIS — Y92017 Garden or yard in single-family (private) house as the place of occurrence of the external cause: Secondary | ICD-10-CM | POA: Insufficient documentation

## 2017-07-12 DIAGNOSIS — W1830XA Fall on same level, unspecified, initial encounter: Secondary | ICD-10-CM | POA: Diagnosis not present

## 2017-07-12 LAB — CBC WITH DIFFERENTIAL/PLATELET
Basophils Absolute: 0 10*3/uL (ref 0.0–0.1)
Basophils Relative: 0 %
Eosinophils Absolute: 0.1 10*3/uL (ref 0.0–0.7)
Eosinophils Relative: 1 %
HCT: 32 % — ABNORMAL LOW (ref 39.0–52.0)
Hemoglobin: 10 g/dL — ABNORMAL LOW (ref 13.0–17.0)
Lymphocytes Relative: 12 %
Lymphs Abs: 2 10*3/uL (ref 0.7–4.0)
MCH: 26.9 pg (ref 26.0–34.0)
MCHC: 31.3 g/dL (ref 30.0–36.0)
MCV: 86 fL (ref 78.0–100.0)
Monocytes Absolute: 1.5 10*3/uL — ABNORMAL HIGH (ref 0.1–1.0)
Monocytes Relative: 9 %
Neutro Abs: 13.5 10*3/uL — ABNORMAL HIGH (ref 1.7–7.7)
Neutrophils Relative %: 78 %
Platelets: 347 10*3/uL (ref 150–400)
RBC: 3.72 MIL/uL — ABNORMAL LOW (ref 4.22–5.81)
RDW: 14.1 % (ref 11.5–15.5)
WBC: 17.1 10*3/uL — ABNORMAL HIGH (ref 4.0–10.5)

## 2017-07-12 LAB — COMPREHENSIVE METABOLIC PANEL
ALT: 11 U/L — ABNORMAL LOW (ref 17–63)
AST: 18 U/L (ref 15–41)
Albumin: 4.1 g/dL (ref 3.5–5.0)
Alkaline Phosphatase: 121 U/L (ref 38–126)
Anion gap: 12 (ref 5–15)
BUN: 34 mg/dL — ABNORMAL HIGH (ref 6–20)
CO2: 29 mmol/L (ref 22–32)
Calcium: 9.1 mg/dL (ref 8.9–10.3)
Chloride: 98 mmol/L — ABNORMAL LOW (ref 101–111)
Creatinine, Ser: 1.21 mg/dL (ref 0.61–1.24)
GFR calc Af Amer: >60 mL/min (ref >60)
GFR calc non Af Amer: 55 mL/min — ABNORMAL LOW (ref >60)
Glucose, Bld: 241 mg/dL — ABNORMAL HIGH (ref 65–99)
Potassium: 3.5 mmol/L (ref 3.5–5.1)
Sodium: 139 mmol/L (ref 135–145)
Total Bilirubin: 0.9 mg/dL (ref 0.3–1.2)
Total Protein: 7.1 g/dL (ref 6.5–8.1)

## 2017-07-12 MED ORDER — SODIUM CHLORIDE 0.9 % IV BOLUS (SEPSIS)
1000.0000 mL | Freq: Once | INTRAVENOUS | Status: AC
  Administered 2017-07-12: 1000 mL via INTRAVENOUS

## 2017-07-12 MED ORDER — LIDOCAINE-EPINEPHRINE (PF) 2 %-1:200000 IJ SOLN
INTRAMUSCULAR | Status: AC
  Administered 2017-07-12: 20 mL
  Filled 2017-07-12: qty 20

## 2017-07-12 NOTE — Discharge Instructions (Signed)
Keep dressing in place for 2 days. You may remove it. There is a foam dressing under it. If it doesn't remove easily, please leave it on until you see your doctor.   Hold plavix for 2 days.   See your doctor in a week for suture removal. If your doctor isn't comfortable removing the stitches, go to urgent care or the ED.   You have bone lesions on your scalp. You need a bone scan with your doctor.   Return to ER if you have uncontrolled bleeding, severe pain, headaches, vomiting, weakness

## 2017-07-12 NOTE — ED Triage Notes (Signed)
Pt from home with c/o head lac. Pt remembers fall. Pt states he tripped over feet. Pt is on blood thinners

## 2017-07-12 NOTE — ED Notes (Signed)
He remains in no distress and tells me he is comfortable. Dr. Silverio LayYao is addressing some bleeding of his wound, which he had formerly sutured. Dr. Silverio LayYao applies Surgicel and a pressure dressing to his right temple area sutured wound.

## 2017-07-12 NOTE — ED Provider Notes (Signed)
[place] DEPT Provider Note   CSN: [id number] Arrival date & time: 07/12/17  1521     History   Chief Complaint Chief Complaint  Patient presents with  . Head Laceration    thinners     HPI Angel [name] is a 81 y.o. male history of CAD, diabetes, here presenting with fall.  Patient states that he was outside walking and tripped over something and fell and hit his face.  He was noted to have possible arterial bleeding of his face and went to his neighbor's house.  His neighbor immediately brought him by private vehicle.  While in triage, patient was bleeding profusely so was brought in immediately.  Patient states that he is on aspirin and Plavix at baseline and denies any chest pain or shortness of breath prior to the event.  Patient states that his last tetanus shot was about 3 years ago.  The history is provided by the patient.    Past Medical History:  Diagnosis Date  . Arthritis   . Benign prostatic hypertrophy   . BPH (benign prostatic hyperplasia)   . Coronary artery disease    status post PTCA and stenting of the left anterior descending artery  . Diabetes mellitus   . Dyspnea    with exertion   . Hyperlipidemia   . Hypertension   . Paralyzed hemidiaphragm    Left  . Pneumonia    50 years ago    Patient Active Problem List   Diagnosis Date Noted  . BPH with obstruction/lower urinary tract symptoms 05/05/2017  . Nonrheumatic aortic valve stenosis 10/03/2016  . Chronic diastolic CHF (congestive heart failure) ([place]) 10/03/2016  . Acute bronchitis   . Diaphragm paralysis   . Acute encephalopathy   . Influenza with pneumonia   . Acute urinary retention   . Chest pain on breathing   . Acute on chronic respiratory failure with hypoxemia ([place])   . Acute respiratory failure with hypoxia ([place]) 08/12/2016  . Influenza 08/12/2016  . Hypertensive heart disease without heart failure   . Demand ischemia ([place])   . S/P coronary artery  stent placement   . COPD exacerbation ([place]) 08/11/2016  . Diabetes mellitus ([place]) 10/06/2011  . HTN (hypertension) 10/06/2011  . Hyperlipidemia 10/06/2011  . CAD (coronary artery disease) 04/07/2011    Past Surgical History:  Procedure Laterality Date  . THULIUM LASER TURP (TRANSURETHRAL RESECTION OF PROSTATE) N/A 10/30/2016   Procedure: THULIUM LASER TURP (TRANSURETHRAL RESECTION OF PROSTATE);  Surgeon: Irine Seal, MD;  Location: [facility];  Service: Urology;  Laterality: N/A;  . TRANSURETHRAL RESECTION OF PROSTATE N/A 05/05/2017   Procedure: TRANSURETHRAL RESECTION OF THE PROSTATE (TURP);  Surgeon: Irine Seal, MD;  Location: [facility];  Service: Urology;  Laterality: N/A;  NEEDS 60 MIN FOR PROCEDURE       Home Medications    Prior to Admission medications   Medication Sig Start Date End Date Taking? Authorizing Provider  albuterol (PROVENTIL HFA;VENTOLIN HFA) 108 (90 Base) MCG/ACT inhaler Inhale 1-2 puffs into the lungs every 4 (four) hours as needed for wheezing or shortness of breath.    [provider]  aspirin 81 MG tablet Take 81 mg by mouth at bedtime.     [provider]  atorvastatin (LIPITOR) 40 MG tablet Take 40 mg by mouth daily at 6 PM.    [provider]  clopidogrel (PLAVIX) 75 MG tablet Take 75 mg by mouth at bedtime. 07/28/16   [provider]  furosemide (LASIX) 40 MG tablet Take 1 tablet by mouth daily. 03/31/17   [provider]  glipiZIDE-metformin (METAGLIP) 5-500 MG tablet Take 1-1.5 tablets by mouth 2 (two) times daily before a meal. Patient takes 1.5 tablet by mouth in the morning and a 1 tablet by mouth at night.    [provider]  nitroGLYCERIN (NITROSTAT) 0.4 MG SL tablet Place 0.4 mg under the tongue every 5 (five) minutes as needed for chest pain.    [provider]  orphenadrine (NORFLEX) 100 MG tablet Take 100 mg by mouth 2 (two) times daily as needed for muscle spasms.    [provider]    potassium chloride SA (K-DUR,KLOR-CON) 20 MEQ tablet Take 1 tablet by mouth daily. 03/31/17   [provider]    Family History Family History  Problem Relation Age of Onset  . Cancer Father   . Cancer Brother     Social History Social History   Tobacco Use  . Smoking status: Never Smoker  . Smokeless tobacco: Never Used  Substance Use Topics  . Alcohol use: No  . Drug use: No     Allergies   Patient has no known allergies.   Review of Systems Review of Systems  Skin: Positive for wound.  All other systems reviewed and are negative.    Physical Exam Updated Vital Signs BP 112/72   Pulse (!) 121   SpO2 96%   Physical Exam  Constitutional: He is oriented to person, place, and time.  Uncomfortable, holding his facial laceration   HENT:  4 cm laceration R forehead with active arterial bleeding. There is 2 cm laceration R scalp  Eyes: Conjunctivae and EOM are normal. Pupils are equal, round, and reactive to light.  Neck: Normal range of motion. Neck supple.  Cardiovascular: Normal rate, regular rhythm and normal heart sounds.  Pulmonary/Chest: Effort normal and breath sounds normal. No stridor. No respiratory distress. He has no wheezes.  Abdominal: Soft. Bowel sounds are normal. He exhibits no distension. There is no tenderness. There is no guarding.  Musculoskeletal:  Abrasions bilateral knees, no obvious deformity, nl ROM bilateral knees. Nl ROM bilateral hips, no spinal tenderness   Neurological: He is alert and oriented to person, place, and time.  Skin: Skin is warm.  Psychiatric: He has a normal mood and affect.  Nursing note and vitals reviewed.    ED Treatments / Results  Labs (all labs ordered are listed, but only abnormal results are displayed) Labs Reviewed  CBC WITH DIFFERENTIAL/PLATELET  COMPREHENSIVE METABOLIC PANEL    EKG  EKG Interpretation None       Radiology No results found.  Procedures Procedures (including  critical care time)  LACERATION REPAIR Performed by: David H Yao Authorized by: David H Yao Consent: Verbal consent obtained. Risks and benefits: risks, benefits and alternatives were discussed Consent given by: patient Patient identity confirmed: provided demographic data Prepped and Draped in normal sterile fashion Wound explored  Laceration Location: R forehead  Laceration Length: 4 cm  No Foreign Bodies seen or palpated  Anesthesia: local infiltration  Local anesthetic: lidocaine 2% with epinephrine  Anesthetic total: 5 ml  Irrigation method: syringe Amount of cleaning: standard  Skin closure: multi layer  Number of sutures: 2 5-0 Ethilon horizontal mattress, 3 5-0 Ethilon simple interrupted  Technique: see above   Patient tolerance: Patient tolerated the procedure well with no immediate complications.   LACERATION REPAIR Performed by: David H Yao Authorized by: David   H Yao Consent: Verbal consent obtained. Risks and benefits: risks, benefits and alternatives were discussed Consent given by: patient Patient identity confirmed: provided demographic data Prepped and Draped in normal sterile fashion Wound explored  Laceration Location: R scalp Laceration Length: 2 cm  No Foreign Bodies seen or palpated  Anesthesia: none Local anesthetic: none  Irrigation method: syringe Amount of cleaning: standard  Skin closure:   Number of staples: 2    Patient tolerance: Patient tolerated the procedure well with no immediate complications.   Medications Ordered in ED Medications  lidocaine-EPINEPHrine (XYLOCAINE W/EPI) 2 %-1:200000 (PF) injection (not administered)  sodium chloride 0.9 % bolus 1,000 mL (not administered)     Initial Impression / Assessment and Plan / ED Course  I have reviewed the triage vital signs and the nursing notes.  Pertinent labs & imaging results that were available during my care of the patient were reviewed by me and considered  in my medical decision making (see chart for details).     Demetre A Foutz is a 81 y.o. male here with fall on plavix, ASA. He has arterial bleeding so I used 2 horizontal mattress sutures and was able to control the bleeding. There is also another laceration in the scalp and I was able to staple it. Tdap up to date. Will get labs, CT head/neck/face.   6:04 PM  CT head showed no bleed. He has some oozing after laceration repair so I placed surgicel and compression dressing. Tachycardia improved. Hg stable. He has incidental lytic lesions on the R parietal bone. He has no hypercalemia and no acute renal failure to suggest multiple myeloma. He has no significant protein-albumin gap. He can get bone scan outpatient. Recommend PCP follow up for bone lesions, suture removal in a week.   Final Clinical Impressions(s) / ED Diagnoses   Final diagnoses:  None    ED Discharge Orders    None       Yao, David Hsienta, MD 07/12/17 1808  

## 2017-07-17 ENCOUNTER — Encounter (HOSPITAL_COMMUNITY): Payer: Self-pay | Admitting: Emergency Medicine

## 2017-07-17 ENCOUNTER — Emergency Department (HOSPITAL_COMMUNITY)
Admission: EM | Admit: 2017-07-17 | Discharge: 2017-07-17 | Disposition: A | Discharge status: Home / Self Care | Payer: BLUE CROSS/BLUE SHIELD | Attending: Emergency Medicine | Admitting: Emergency Medicine

## 2017-07-17 DIAGNOSIS — Z7984 Long term (current) use of oral hypoglycemic drugs: Secondary | ICD-10-CM | POA: Insufficient documentation

## 2017-07-17 DIAGNOSIS — Z4802 Encounter for removal of sutures: Secondary | ICD-10-CM | POA: Diagnosis not present

## 2017-07-17 DIAGNOSIS — J449 Chronic obstructive pulmonary disease, unspecified: Secondary | ICD-10-CM | POA: Diagnosis not present

## 2017-07-17 DIAGNOSIS — Z7901 Long term (current) use of anticoagulants: Secondary | ICD-10-CM | POA: Insufficient documentation

## 2017-07-17 DIAGNOSIS — I110 Hypertensive heart disease with heart failure: Secondary | ICD-10-CM | POA: Insufficient documentation

## 2017-07-17 DIAGNOSIS — I5032 Chronic diastolic (congestive) heart failure: Secondary | ICD-10-CM | POA: Diagnosis not present

## 2017-07-17 DIAGNOSIS — E119 Type 2 diabetes mellitus without complications: Secondary | ICD-10-CM | POA: Diagnosis not present

## 2017-07-17 DIAGNOSIS — I2510 Atherosclerotic heart disease of native coronary artery without angina pectoris: Secondary | ICD-10-CM | POA: Diagnosis not present

## 2017-07-17 DIAGNOSIS — Z79899 Other long term (current) drug therapy: Secondary | ICD-10-CM | POA: Diagnosis not present

## 2017-07-17 DIAGNOSIS — Z7982 Long term (current) use of aspirin: Secondary | ICD-10-CM | POA: Insufficient documentation

## 2017-07-17 NOTE — ED Provider Notes (Signed)
[facility]-EMERGENCY DEPT Provider Note   CSN: [id number] Arrival date & time: 07/17/17  1231     History   Chief Complaint Chief Complaint  Patient presents with  . Suture / Staple Removal    HPI Angel [name] is a 81 y.o. male.  HPI   81 year old male presenting requesting for suture removal.  Patient fell 5 days ago after he tripped over an object.  He hits his face.  Suffered a laceration to the scalp and forehead.  Wound was repaired with sutures and staples.  He reported doing well without any worsening pain to the associated area.  No fever.  Past Medical History:  Diagnosis Date  . Arthritis   . Benign prostatic hypertrophy   . BPH (benign prostatic hyperplasia)   . Coronary artery disease    status post PTCA and stenting of the left anterior descending artery  . Diabetes mellitus   . Dyspnea    with exertion   . Hyperlipidemia   . Hypertension   . Paralyzed hemidiaphragm    Left  . Pneumonia    50 years ago    Patient Active Problem List   Diagnosis Date Noted  . BPH with obstruction/lower urinary tract symptoms 05/05/2017  . Nonrheumatic aortic valve stenosis 10/03/2016  . Chronic diastolic CHF (congestive heart failure) (HCC) 10/03/2016  . Acute bronchitis   . Diaphragm paralysis   . Acute encephalopathy   . Influenza with pneumonia   . Acute urinary retention   . Chest pain on breathing   . Acute on chronic respiratory failure with hypoxemia (HCC)   . Acute respiratory failure with hypoxia (HCC) 08/12/2016  . Influenza 08/12/2016  . Hypertensive heart disease without heart failure   . Demand ischemia (HCC)   . S/P coronary artery stent placement   . COPD exacerbation (HCC) 08/11/2016  . Diabetes mellitus (HCC) 10/06/2011  . HTN (hypertension) 10/06/2011  . Hyperlipidemia 10/06/2011  . CAD (coronary artery disease) 04/07/2011    Past Surgical History:  Procedure Laterality Date  . THULIUM LASER TURP (TRANSURETHRAL  RESECTION OF PROSTATE) N/A 10/30/2016   Procedure: THULIUM LASER TURP (TRANSURETHRAL RESECTION OF PROSTATE);  Surgeon: Bjorn Pippin, MD;  Location: [facility];  Service: Urology;  Laterality: N/A;  . TRANSURETHRAL RESECTION OF PROSTATE N/A 05/05/2017   Procedure: TRANSURETHRAL RESECTION OF THE PROSTATE (TURP);  Surgeon: Bjorn Pippin, MD;  Location: [facility];  Service: Urology;  Laterality: N/A;  NEEDS 60 MIN FOR PROCEDURE       Home Medications    Prior to Admission medications   Medication Sig Start Date End Date Taking? Authorizing Provider  albuterol (PROVENTIL HFA;VENTOLIN HFA) 108 (90 Base) MCG/ACT inhaler Inhale 1-2 puffs into the lungs every 4 (four) hours as needed for wheezing or shortness of breath.    [provider]  aspirin 81 MG tablet Take 81 mg by mouth at bedtime.     [provider]  atorvastatin (LIPITOR) 40 MG tablet Take 40 mg by mouth daily at 6 PM.    [provider]  clopidogrel (PLAVIX) 75 MG tablet Take 75 mg by mouth at bedtime. 07/28/16   [provider]  CYANOCOBALAMIN PO Take 1 tablet by mouth at bedtime.    [provider]  furosemide (LASIX) 40 MG tablet Take 40 mg by mouth daily. 06/27/17   [provider]  glipiZIDE-metformin (METAGLIP) 5-500 MG tablet Take 2 tablets by mouth 2 (two) times daily before a meal.  [provider]  nitroGLYCERIN (NITROSTAT) 0.4 MG SL tablet Place 0.4 mg under the tongue every 5 (five) minutes as needed for chest pain.    [provider]  potassium chloride SA (K-DUR,KLOR-CON) 20 MEQ tablet Take 20 mEq by mouth daily. 06/27/17   [provider]    Family History Family History  Problem Relation Age of Onset  . Cancer Father   . Cancer Brother     Social History Social History   Tobacco Use  . Smoking status: Never Smoker  . Smokeless tobacco: Never Used  Substance Use Topics  . Alcohol use: No  . Drug use: No     Allergies   Patient has no  known allergies.   Review of Systems Review of Systems  Constitutional: Negative for fever.  Skin: Positive for wound.     Physical Exam Updated Vital Signs BP 127/62 (BP Location: Left Arm)   Pulse 93   Temp 98 F (36.7 C) (Oral)   Resp 16   SpO2 98%   Physical Exam  Constitutional: He appears well-developed and well-nourished. No distress.  HENT:  Head: Atraumatic.  Well-healing wound to right scalp near hairline with 2 surgical staple in place  Right forehead with sutures in place, moderate amount of scab formation noted.  Eyes: Conjunctivae are normal.  Neck: Neck supple.  Neurological: He is alert.  Skin: No rash noted.  Psychiatric: He has a normal mood and affect.  Nursing note and vitals reviewed.    ED Treatments / Results  Labs (all labs ordered are listed, but only abnormal results are displayed) Labs Reviewed - No data to display  EKG  EKG Interpretation None       Radiology No results found.  Procedures Procedures (including critical care time)  SUTURE REMOVAL Performed by: Fayrene HelperBowie Jaquavis Felmlee  Consent: Verbal consent obtained. Patient identity confirmed: provided demographic data Time out: Immediately prior to procedure a "time out" was called to verify the correct patient, procedure, equipment, support staff and site/side marked as required.  Location details: R scalp near hairline  Wound Appearance: clean  Staples Removed: 2  Facility: sutures placed in this facility Patient tolerance: Patient tolerated the procedure well with no immediate complications.   SUTURE REMOVAL Performed by: Fayrene HelperBowie Caelen Reierson  Consent: Verbal consent obtained. Patient identity confirmed: provided demographic data Time out: Immediately prior to procedure a "time out" was called to verify the correct patient, procedure, equipment, support staff and site/side marked as required.  Location details: R forehead  Wound Appearance: clean  Sutures Removed:  4  Facility: sutures placed in this facility Patient tolerance: Patient tolerated the procedure well with no immediate complications.     Medications Ordered in ED Medications - No data to display   Initial Impression / Assessment and Plan / ED Course  I have reviewed the triage vital signs and the nursing notes.  Pertinent labs & imaging results that were available during my care of the patient were reviewed by me and considered in my medical decision making (see chart for details).     BP 127/62 (BP Location: Left Arm)   Pulse 93   Temp 98 F (36.7 C) (Oral)   Resp 16   SpO2 98%    Final Clinical Impressions(s) / ED Diagnoses   Final diagnoses:  Visit for suture removal  Encounter for staple removal    ED Discharge Orders    None     1:24 PM Pt here for sutures and staples  removal from forehead.  Wound is well appearing, no evidence of infection.    Fayrene Helperran, Kolbe Delmonaco, PA-C 07/17/17 1325  Cathren LaineSteinl, Kevin, MD 07/17/17 1537

## 2017-07-17 NOTE — ED Triage Notes (Signed)
Pt here for suture removal

## 2017-07-27 DIAGNOSIS — N401 Enlarged prostate with lower urinary tract symptoms: Secondary | ICD-10-CM | POA: Diagnosis not present

## 2017-11-02 ENCOUNTER — Other Ambulatory Visit: Payer: Self-pay | Admitting: Cardiovascular Disease

## 2017-11-02 MED ORDER — POTASSIUM CHLORIDE CRYS ER 20 MEQ PO TBCR: 20.0000 meq | EXTENDED_RELEASE_TABLET | Freq: Every day | ORAL | 2 refills | Status: DC

## 2017-11-02 MED ORDER — CLOPIDOGREL BISULFATE 75 MG PO TABS: 75.0000 mg | ORAL_TABLET | Freq: Every day | ORAL | 2 refills | Status: DC

## 2017-12-28 DIAGNOSIS — I2510 Atherosclerotic heart disease of native coronary artery without angina pectoris: Secondary | ICD-10-CM | POA: Diagnosis not present

## 2017-12-28 DIAGNOSIS — I10 Essential (primary) hypertension: Secondary | ICD-10-CM | POA: Diagnosis not present

## 2017-12-28 DIAGNOSIS — E1151 Type 2 diabetes mellitus with diabetic peripheral angiopathy without gangrene: Secondary | ICD-10-CM | POA: Diagnosis not present

## 2017-12-28 DIAGNOSIS — Z01118 Encounter for examination of ears and hearing with other abnormal findings: Secondary | ICD-10-CM | POA: Diagnosis not present

## 2017-12-28 DIAGNOSIS — Z0000 Encounter for general adult medical examination without abnormal findings: Secondary | ICD-10-CM | POA: Diagnosis not present

## 2017-12-28 DIAGNOSIS — Z136 Encounter for screening for cardiovascular disorders: Secondary | ICD-10-CM | POA: Diagnosis not present

## 2017-12-28 DIAGNOSIS — R062 Wheezing: Secondary | ICD-10-CM | POA: Diagnosis not present

## 2017-12-28 DIAGNOSIS — E785 Hyperlipidemia, unspecified: Secondary | ICD-10-CM | POA: Diagnosis not present

## 2018-02-05 DIAGNOSIS — H6121 Impacted cerumen, right ear: Secondary | ICD-10-CM | POA: Diagnosis not present

## 2018-02-05 DIAGNOSIS — H903 Sensorineural hearing loss, bilateral: Secondary | ICD-10-CM | POA: Diagnosis not present

## 2018-02-08 DIAGNOSIS — N401 Enlarged prostate with lower urinary tract symptoms: Secondary | ICD-10-CM | POA: Diagnosis not present

## 2018-02-08 DIAGNOSIS — R351 Nocturia: Secondary | ICD-10-CM | POA: Diagnosis not present

## 2018-02-08 DIAGNOSIS — R972 Elevated prostate specific antigen [PSA]: Secondary | ICD-10-CM | POA: Diagnosis not present

## 2018-02-11 ENCOUNTER — Other Ambulatory Visit: Payer: Self-pay | Admitting: Cardiovascular Disease

## 2018-02-15 ENCOUNTER — Encounter: Payer: Self-pay | Admitting: Cardiovascular Disease

## 2018-02-15 ENCOUNTER — Encounter (INDEPENDENT_AMBULATORY_CARE_PROVIDER_SITE_OTHER): Payer: Self-pay

## 2018-02-15 ENCOUNTER — Ambulatory Visit (INDEPENDENT_AMBULATORY_CARE_PROVIDER_SITE_OTHER): Payer: BLUE CROSS/BLUE SHIELD | Admitting: Cardiovascular Disease

## 2018-02-15 VITALS — BP 96/50 | HR 83 | Ht 65.0 in | Wt 148.0 lb

## 2018-02-15 DIAGNOSIS — E782 Mixed hyperlipidemia: Secondary | ICD-10-CM

## 2018-02-15 DIAGNOSIS — I2510 Atherosclerotic heart disease of native coronary artery without angina pectoris: Secondary | ICD-10-CM | POA: Diagnosis not present

## 2018-02-15 LAB — HEPATIC FUNCTION PANEL
ALT: 9 IU/L (ref 0–44)
AST: 11 IU/L (ref 0–40)
Albumin: 4.2 g/dL (ref 3.5–4.7)
Alkaline Phosphatase: 134 IU/L — ABNORMAL HIGH (ref 39–117)
Bilirubin Total: 0.2 mg/dL (ref 0.0–1.2)
Bilirubin, Direct: 0.1 mg/dL (ref 0.00–0.40)
Total Protein: 6.4 g/dL (ref 6.0–8.5)

## 2018-02-15 LAB — BASIC METABOLIC PANEL
BUN/Creatinine Ratio: 24 (ref 10–24)
BUN: 27 mg/dL (ref 8–27)
CO2: 26 mmol/L (ref 20–29)
Calcium: 9.2 mg/dL (ref 8.6–10.2)
Chloride: 96 mmol/L (ref 96–106)
Creatinine, Ser: 1.13 mg/dL (ref 0.76–1.27)
GFR calc Af Amer: 71 mL/min/{1.73_m2} (ref >59)
GFR calc non Af Amer: 61 mL/min/{1.73_m2} (ref >59)
Glucose: 179 mg/dL — ABNORMAL HIGH (ref 65–99)
Potassium: 4.4 mmol/L (ref 3.5–5.2)
Sodium: 140 mmol/L (ref 134–144)

## 2018-02-15 LAB — LIPID PANEL
Chol/HDL Ratio: 2.9 ratio (ref 0.0–5.0)
Cholesterol, Total: 129 mg/dL (ref 100–199)
HDL: 45 mg/dL (ref >39)
LDL Calculated: 67 mg/dL (ref 0–99)
Triglycerides: 84 mg/dL (ref 0–149)
VLDL Cholesterol Cal: 17 mg/dL (ref 5–40)

## 2018-02-15 NOTE — Progress Notes (Signed)
Angel [name] Date of Birth  04-05-1936    [address]    [address]    [place], [place]  [address]       Problems: 1. Coronary artery disease-status post PTCA and stenting of the LAD 2. Paralyzed left hemidiaphragm 3. Diabetes mellitus 4. Hypertension 5. Hyperlipidemia  History of Present Illness:  Angel [name] is a 82 yo with the above noted hx.  No chest pain.  He remains active .  He's not had any episodes of chest pain or shortness breath. He still exercises on an intermittent basis.  Nov 29, 2012:  Angel [name] is doing well. No CP,  Breathing is the same- slightly short of breath.  He is still working 4 days a week.  He has put in a garden this spring - has tomatoes, peppers, etc.   Nov. 10, 2014:  Angel [name] is doing well.  No angina.    Dec 05, 2013:  Angel [name] is doing well.  No angina.  Staying active.    January 09, 2015:  Doing well, no episodes of angina. He does all of his normal activities.  Still gardening .    February 04, 2016:  Still working,   Still has his garden . No CP , Breathing is stable   October 03, 2016:  Angel [name] is seen today for follow up visit .  Just getting over the flu and pneumonia. Spent 10 days in hospital.   He has a paralized left hemi - diaphragm and feels about like his normal state.   Saw pulmonary a month ago  - O2 sats were 96% at that time .   No angina .   Back doing his normal activities    January 05, 2017:  Angel [name] is seen today. No cardiac issues  BP is a little low, No dizziness.   July 07, 2017:  Angel [name] is seen today for follow up visit of his CAd Remaining active.  No Angina   February 15, 2018 :  Angel [name] is seen today for follow up visit,  Seen with daughter, Stasia Cavalier.  For his CAD and hyperlipidemia. Doing his normal activitiies. Breathing is ok  BP has been on the low side. His Primary md has tried holding his ramipril but his BP went too high   Current Outpatient Medications on File Prior to Visit  Medication Sig Dispense Refill    . albuterol (PROVENTIL HFA;VENTOLIN HFA) 108 (90 Base) MCG/ACT inhaler Inhale 1-2 puffs into the lungs every 4 (four) hours as needed for wheezing or shortness of breath.    Marland Kitchen aspirin 81 MG tablet Take 81 mg by mouth at bedtime.     Marland Kitchen atorvastatin (LIPITOR) 40 MG tablet TAKE 1 TABLET BY MOUTH EVERY DAY 90 tablet 0  . clopidogrel (PLAVIX) 75 MG tablet Take 1 tablet (75 mg total) by mouth at bedtime. 90 tablet 2  . CYANOCOBALAMIN PO Take 1 tablet by mouth at bedtime.    . furosemide (LASIX) 40 MG tablet Take 40 mg by mouth daily.  0  . glipiZIDE-metformin (METAGLIP) 5-500 MG tablet Take 2 tablets by mouth 2 (two) times daily before a meal.     . nitroGLYCERIN (NITROSTAT) 0.4 MG SL tablet Place 0.4 mg under the tongue every 5 (five) minutes as needed for chest pain.    . ramipril (ALTACE) 5 MG capsule Take 5 mg by mouth daily.     No current facility-administered medications on file prior to visit.  No Known Allergies  Past Medical History:  Diagnosis Date  . Arthritis   . Benign prostatic hypertrophy   . BPH (benign prostatic hyperplasia)   . Coronary artery disease    status post PTCA and stenting of the left anterior descending artery  . Diabetes mellitus   . Dyspnea    with exertion   . Hyperlipidemia   . Hypertension   . Paralyzed hemidiaphragm    Left  . Pneumonia    50 years ago    Past Surgical History:  Procedure Laterality Date  . THULIUM LASER TURP (TRANSURETHRAL RESECTION OF PROSTATE) N/A 10/30/2016   Procedure: THULIUM LASER TURP (TRANSURETHRAL RESECTION OF PROSTATE);  Surgeon: Bjorn PippinJohn Wrenn, MD;  [facility]: [facility];  Service: Urology;  Laterality: N/A;  . TRANSURETHRAL RESECTION OF PROSTATE N/A 05/05/2017   Procedure: TRANSURETHRAL RESECTION OF THE PROSTATE (TURP);  Surgeon: Bjorn PippinWrenn, John, MD;  Location: [facility];  Service: Urology;  Laterality: N/A;  NEEDS 60 MIN FOR PROCEDURE    Social History   Tobacco Use  Smoking Status Never Smoker  Smokeless Tobacco Never  Used    Social History   Substance and Sexual Activity  Alcohol Use No    Family History  Problem Relation Age of Onset  . Cancer Father   . Cancer Brother     Reviw of Systems:  Noted in current history  Physical Exam: Blood pressure (!) 96/50, pulse 83, height 5\' 5"  (1.651 m), weight 148 lb (67.1 kg), SpO2 90 %.  GEN: Elderly gentleman, no acute distress. HEENT: Normal NECK: No JVD; No carotid bruits LYMPHATICS: No lymphadenopathy CARDIAC: RR, soft systolic murmur.  Radiation to the aortic area. RESPIRATORY: Greatly reduced breath sounds in the left lung field. ABDOMEN: Soft, non-tender, non-distended MUSCULOSKELETAL:  No edema; No deformity  SKIN: Warm and dry NEUROLOGIC:  Alert and oriented x 3    ECG:     Assessment / Plan:   1. Coronary artery disease-status post PTCA and stenting of the LAD -    Not having any episodes of angina.  Continue current medication.  2. Paralyzed left hemidiaphragm -stable  3. Diabetes mellitus 4. Hypertension -blood pressure is a little on the low side.  He has tried holding the ramipril in the past but this resulted in elevated blood pressure.  I offered to switch him to losartan 25 mg a day but he would like for his primary medical doctor to manage this.  5. Hyperlipidemia   .  Continue current meds.  Will check fasting labs today.  7. Chronic diastolic CHF : He is not having any symptoms.  Continue current medications.      Kristeen MissPhilip Jamai Dolce, MD  02/15/2018 10:34 AM    [facility] [address],  [address] [place], [place]  [address] Pager [phone] Phone: [phone](336) [phone]; Fax: [phone]) [phone]

## 2018-02-15 NOTE — Patient Instructions (Signed)
Medication Instructions:  Your physician recommends that you continue on your current medications as directed. Please refer to the Current Medication list given to you today.   Labwork: TODAY - cholesterol, liver panel, basic metabolic panel   Testing/Procedures: None Ordered   Follow-Up: Your physician wants you to follow-up in: 6 months with Dr. Nahser. You will receive a reminder letter in the mail two months in advance. If you don't receive a letter, please call our office to schedule the follow-up appointment.   If you need a refill on your cardiac medications before your next appointment, please call your pharmacy.   Thank you for choosing [facility]! Kacin Dancy, RN [phone]    

## 2018-02-22 ENCOUNTER — Encounter: Payer: Self-pay | Admitting: Hematology

## 2018-02-22 ENCOUNTER — Telehealth: Payer: Self-pay | Admitting: Hematology

## 2018-02-22 NOTE — Telephone Encounter (Signed)
New hematology referral received from Dr. Julio Sickssei-Bonsu for anemia. Spoke to the pt's daughter and scheduled an appt for the pt to see Dr. Candise CheKale on 8/26 at 1pm. Aware to arrive 30 minutes early. Letter mailed.

## 2018-03-15 ENCOUNTER — Encounter: Payer: BLUE CROSS/BLUE SHIELD | Admitting: Hematology

## 2018-03-22 IMAGING — CT CT CHEST W/ CM
2 of 4 series · 15 of 36 positions shown, 18 images · IV contrast (iopamidol)
Comparison: Chest x-ray 08/11/2016.

CLINICAL DATA: Marked asymmetric elevation left hemidiaphragm.

EXAM:
CT CHEST WITH CONTRAST
TECHNIQUE: Multidetector CT imaging of the chest was performed during
intravenous contrast administration.
CONTRAST:  1 [id number] IOPAMIDOL ([id number]) INJECTION 61%

[Series 2: chest with st · axial · 0.86mm/px · z∈[+1262,+1498]mm · 12 of 140 slices shown, 15 images]
[im 11/140  mediastinal]
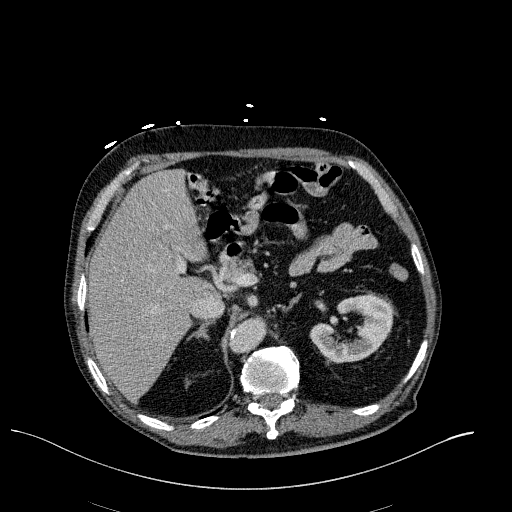
[im 11/140  lung]
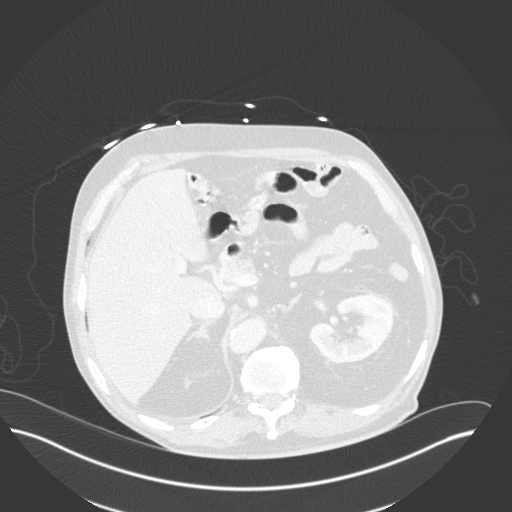
[im 22/140  lung]
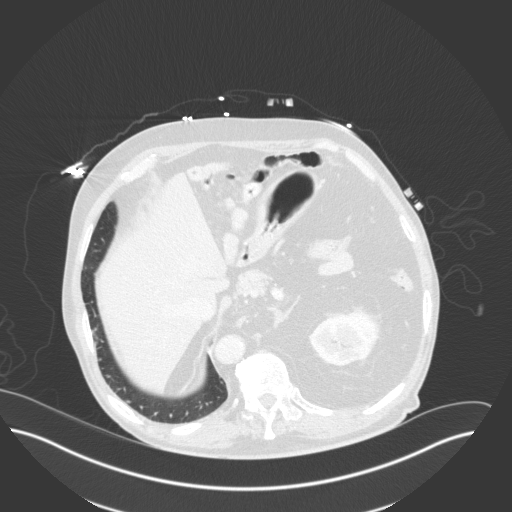
[im 33/140  lung]
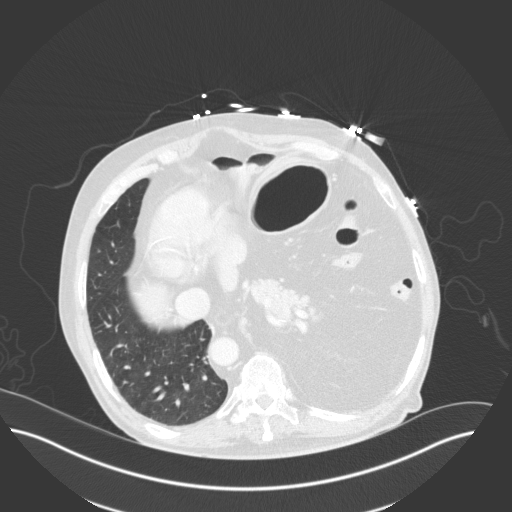
[im 43/140  lung]
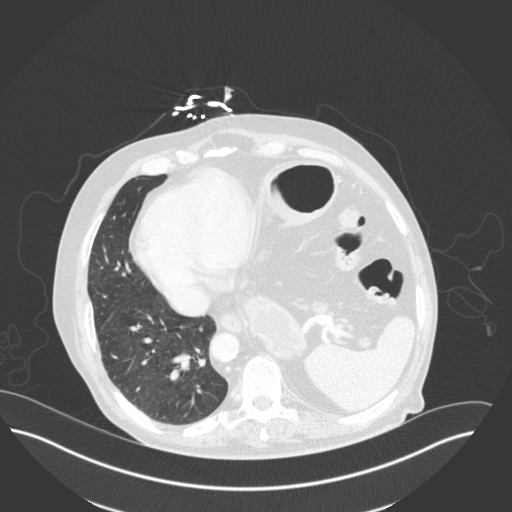
[im 54/140  mediastinal]
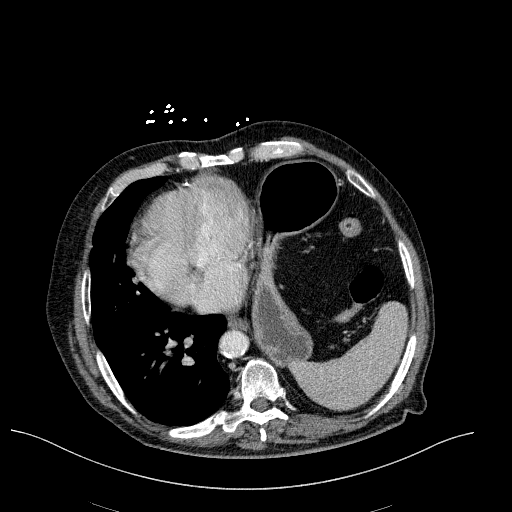
[im 54/140  lung]
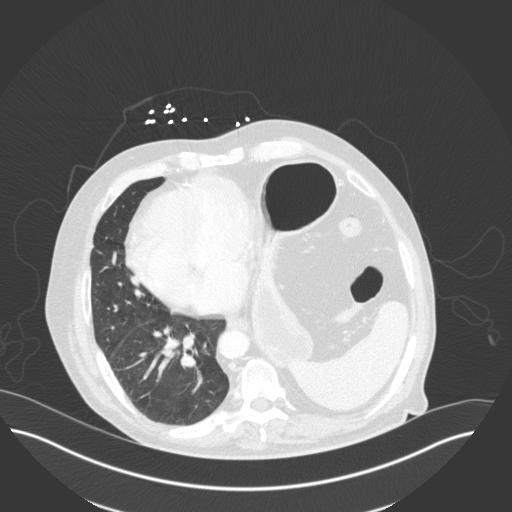
[im 65/140  lung]
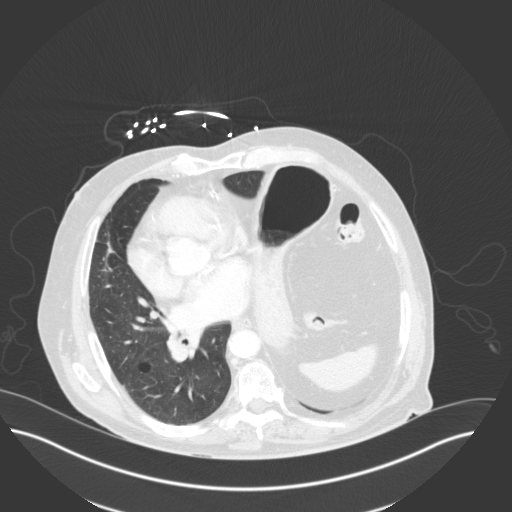
[im 75/140  lung]
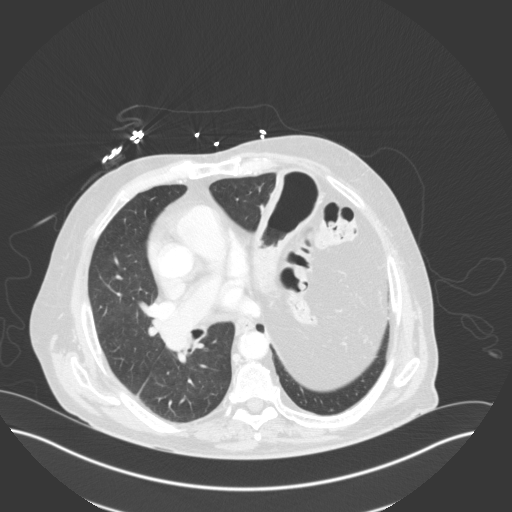
[im 86/140  lung]
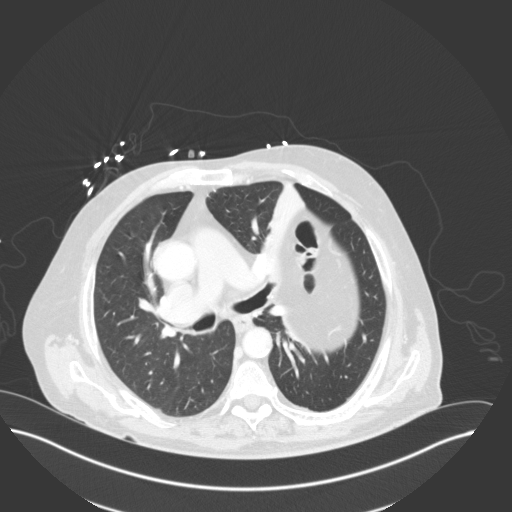
[im 97/140  mediastinal]
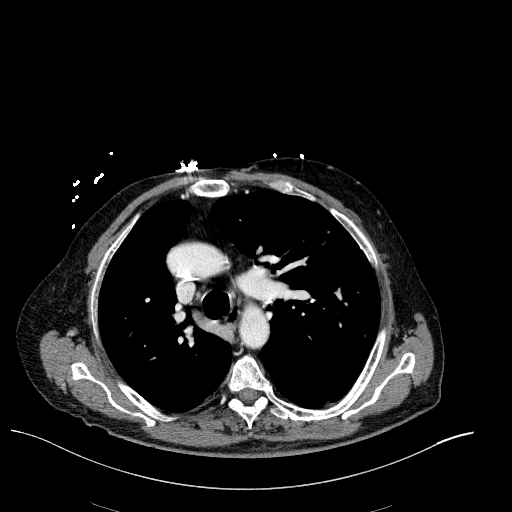
[im 97/140  lung]
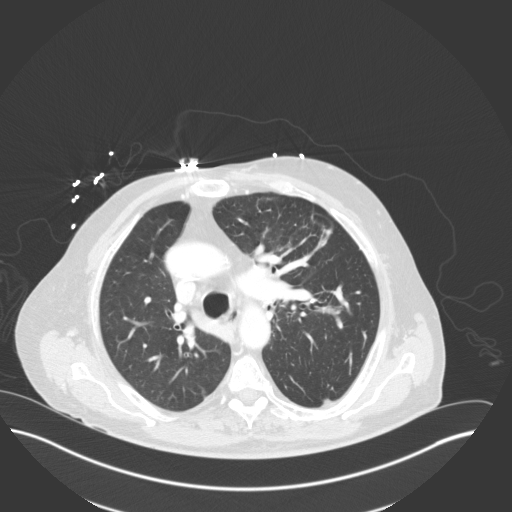
[im 107/140  lung]
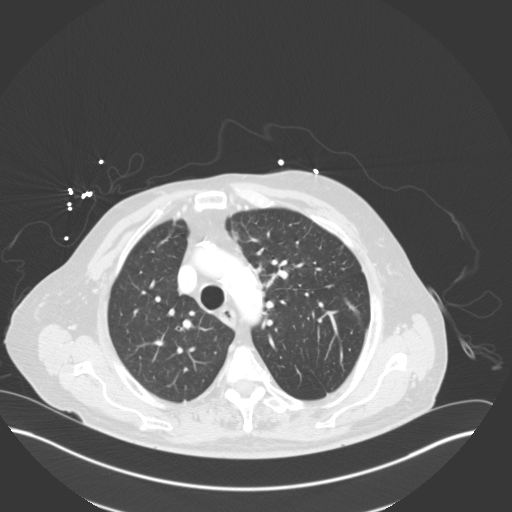
[im 118/140  lung]
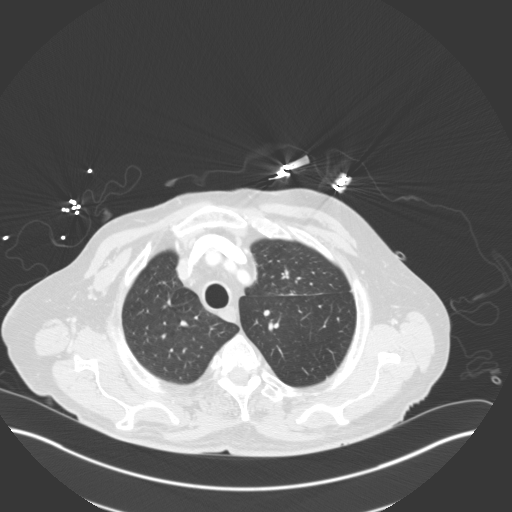
[im 129/140  lung]
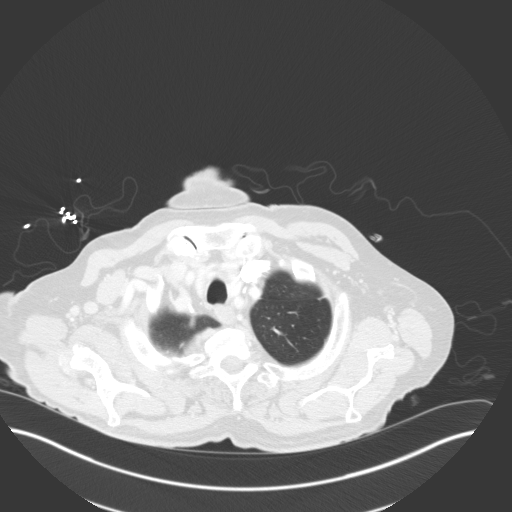

[Series 6: coronals · coronal · 0.57mm/px · 3 of 101 slices shown]
[im 21/101  lung]
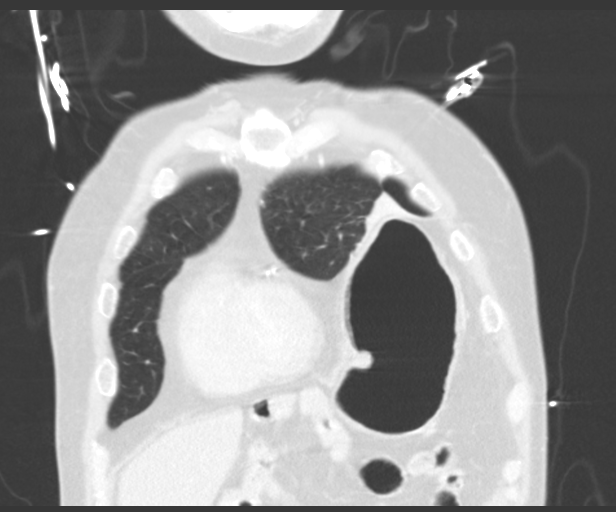
[im 41/101  lung]
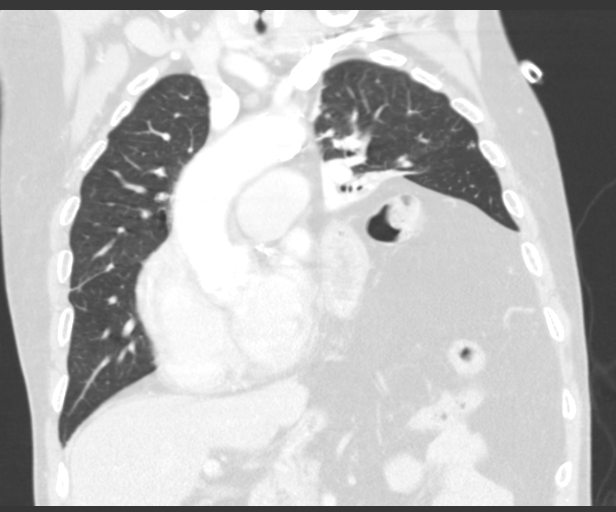
[im 61/101  lung]
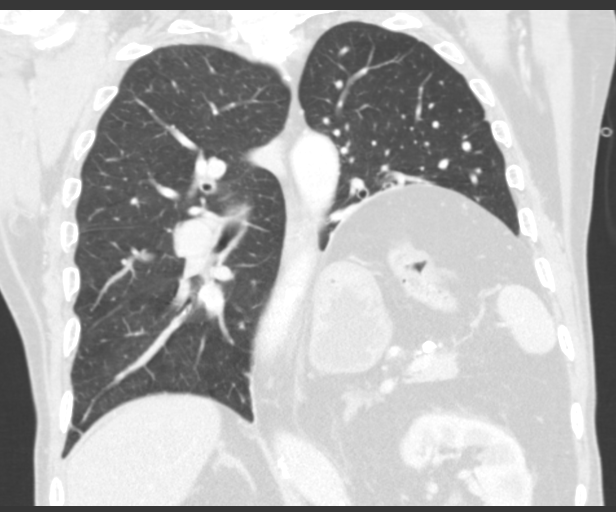

[15 of 36 positions shown; findings below may reference images not displayed]

FINDINGS: Cardiovascular: The heart size is normal. No pericardial effusion.
Coronary artery calcification is noted. Atherosclerotic
calcification is noted in the wall of the thoracic aorta.

Mediastinum/Nodes: Scattered small mediastinal lymph nodes noted
without lymphadenopathy. There is no axillary lymphadenopathy. The
esophagus has normal imaging features. There is no hilar
lymphadenopathy.

Lungs/Pleura: Fine detail obscured by breathing motion during image
acquisition. Centrilobular and paraseptal emphysema noted
bilaterally. Abdominal fat and anatomy from the left upper quadrant
projects up into the lower left hemithorax. No overlying
diaphragmatic tissue can be identified in this may represent a large
hernia. Although images are unavailable, the report for a CT scan
4991 documented "Markedly elevated left hemidiaphragm" suggesting
this is a chronic process. Anterior left lung collapse/ scarring is
evident. No suspicious pulmonary nodule or mass. No pulmonary edema
or pleural effusion.

Upper Abdomen: Visualized portions the upper abdomen are
unremarkable.

Musculoskeletal: Bone windows reveal no worrisome lytic or sclerotic
osseous lesions.
IMPRESSION: 1. Marked volume loss in the left hemithorax way elevation of left
upper quadrant abdominal contents in the lower left chest. No left
hemi diaphragmatic tissue can be seen overlying the abdominal
contents in this may represent a large diaphragmatic hernia or
marked attenuation and elevation of the left hemidiaphragm. Similar
appearance was described on the CT scan from 14 years ago suggesting
chronicity.

2. Coronary artery atherosclerosis.
3.  Emphysema. ([id number])

## 2018-03-29 DIAGNOSIS — N400 Enlarged prostate without lower urinary tract symptoms: Secondary | ICD-10-CM | POA: Diagnosis not present

## 2018-03-29 DIAGNOSIS — N182 Chronic kidney disease, stage 2 (mild): Secondary | ICD-10-CM | POA: Diagnosis not present

## 2018-03-29 DIAGNOSIS — E119 Type 2 diabetes mellitus without complications: Secondary | ICD-10-CM | POA: Diagnosis not present

## 2018-03-29 DIAGNOSIS — R799 Abnormal finding of blood chemistry, unspecified: Secondary | ICD-10-CM | POA: Diagnosis not present

## 2018-03-29 DIAGNOSIS — I10 Essential (primary) hypertension: Secondary | ICD-10-CM | POA: Diagnosis not present

## 2018-05-10 DIAGNOSIS — R972 Elevated prostate specific antigen [PSA]: Secondary | ICD-10-CM | POA: Diagnosis not present

## 2018-08-12 ENCOUNTER — Other Ambulatory Visit: Payer: Self-pay | Admitting: Cardiovascular Disease

## 2018-08-16 ENCOUNTER — Encounter: Payer: Self-pay | Admitting: Cardiovascular Disease

## 2018-08-16 ENCOUNTER — Ambulatory Visit (INDEPENDENT_AMBULATORY_CARE_PROVIDER_SITE_OTHER): Payer: BLUE CROSS/BLUE SHIELD | Admitting: Cardiovascular Disease

## 2018-08-16 ENCOUNTER — Other Ambulatory Visit: Payer: Self-pay | Admitting: *Deleted

## 2018-08-16 VITALS — BP 124/62 | HR 79 | Ht 65.0 in | Wt 146.4 lb

## 2018-08-16 DIAGNOSIS — I350 Nonrheumatic aortic (valve) stenosis: Secondary | ICD-10-CM | POA: Diagnosis not present

## 2018-08-16 DIAGNOSIS — I2510 Atherosclerotic heart disease of native coronary artery without angina pectoris: Secondary | ICD-10-CM | POA: Diagnosis not present

## 2018-08-16 DIAGNOSIS — E785 Hyperlipidemia, unspecified: Secondary | ICD-10-CM

## 2018-08-16 LAB — HEPATIC FUNCTION PANEL
ALT: 9 IU/L (ref 0–44)
AST: 12 IU/L (ref 0–40)
Albumin: 4.5 g/dL (ref 3.7–4.7)
Alkaline Phosphatase: 89 IU/L (ref 39–117)
Bilirubin Total: 0.5 mg/dL (ref 0.0–1.2)
Bilirubin, Direct: 0.16 mg/dL (ref 0.00–0.40)
Total Protein: 6.5 g/dL (ref 6.0–8.5)

## 2018-08-16 LAB — BASIC METABOLIC PANEL
BUN/Creatinine Ratio: 23 (ref 10–24)
BUN: 27 mg/dL (ref 8–27)
CO2: 27 mmol/L (ref 20–29)
Calcium: 9.6 mg/dL (ref 8.6–10.2)
Chloride: 91 mmol/L — ABNORMAL LOW (ref 96–106)
Creatinine, Ser: 1.17 mg/dL (ref 0.76–1.27)
GFR calc Af Amer: 68 mL/min/{1.73_m2} (ref >59)
GFR calc non Af Amer: 59 mL/min/{1.73_m2} — ABNORMAL LOW (ref >59)
Glucose: 96 mg/dL (ref 65–99)
Potassium: 4.4 mmol/L (ref 3.5–5.2)
Sodium: 135 mmol/L (ref 134–144)

## 2018-08-16 LAB — LIPID PANEL
Chol/HDL Ratio: 2.4 ratio (ref 0.0–5.0)
Cholesterol, Total: 104 mg/dL (ref 100–199)
HDL: 44 mg/dL (ref >39)
LDL Calculated: 41 mg/dL (ref 0–99)
Triglycerides: 94 mg/dL (ref 0–149)
VLDL Cholesterol Cal: 19 mg/dL (ref 5–40)

## 2018-08-16 NOTE — Patient Instructions (Signed)
Medication Instructions:  Your physician recommends that you continue on your current medications as directed. Please refer to the Current Medication list given to you today.  If you need a refill on your cardiac medications before your next appointment, please call your pharmacy.    Lab work: TODAY - cholesterol, liver panel, basic metabolic panel    Testing/Procedures: None Ordered   Follow-Up: At [facility], you and your health needs are our priority.  As part of our continuing mission to provide you with exceptional heart care, we have created designated Provider Care Teams.  These Care Teams include your primary Cardiologist (physician) and Advanced Practice Providers (APPs -  Physician Assistants and Nurse Practitioners) who all work together to provide you with the care you need, when you need it. You will need a follow up appointment in:  6 months.  Please call our office 2 months in advance to schedule this appointment.  You may see Philip Nahser, MD or one of the following Advanced Practice Providers on your designated Care Team: Scott Weaver, PA-C Vin Bhagat, PA-C . Janine Hammond, NP    

## 2018-08-16 NOTE — Progress Notes (Signed)
Angel [name] Date of Birth  1936-05-22    [address]    [address]    [place], [place]  [id number]       Problems: 1. Coronary artery disease-status post PTCA and stenting of the LAD 2. Paralyzed left hemidiaphragm 3. Diabetes mellitus 4. Hypertension 5. Hyperlipidemia  History of Present Illness:  Angel [name] is a 83 yo with the above noted hx.  No chest pain.  He remains active .  He's not had any episodes of chest pain or shortness breath. He still exercises on an intermittent basis.  Nov 29, 2012:  Angel [name] is doing well. No CP,  Breathing is the same- slightly short of breath.  He is still working 4 days a week.  He has put in a garden this spring - has tomatoes, peppers, etc.   Nov. 10, 2014:  Angel [name] is doing well.  No angina.    Dec 05, 2013:  Angel [name] is doing well.  No angina.  Staying active.    January 09, 2015:  Doing well, no episodes of angina. He does all of his normal activities.  Still gardening .    February 04, 2016:  Still working,   Still has his garden . No CP , Breathing is stable   October 03, 2016:  Angel [name] is seen today for follow up visit .  Just getting over the flu and pneumonia. Spent 10 days in hospital.   He has a paralized left hemi - diaphragm and feels about like his normal state.   Saw pulmonary a month ago  - O2 sats were 96% at that time .   No angina .   Back doing his normal activities    January 05, 2017:  Angel [name] is seen today. No cardiac issues  BP is a little low, No dizziness.   July 07, 2017:  Angel [name] is seen today for follow up visit of his CAd Remaining active.  No Angina   February 15, 2018 :  Angel [name] is seen today for follow up visit,  Seen with daughter, Stasia Cavaliervette.  For his CAD and hyperlipidemia. Doing his normal activitiies. Breathing is ok  BP has been on the low side. His Primary md has tried holding his ramipril but his BP went too high   August 16, 2018:  Angel [name] is seen today for follow-up of his coronary artery disease.   Also has a history of hyperlipidemia.  Denies any chest pain or shortness of breath.  Exercises regularly.  Still does all that he wants to do.  Current Outpatient Medications on File Prior to Visit  Medication Sig Dispense Refill  . albuterol (PROVENTIL HFA;VENTOLIN HFA) 108 (90 Base) MCG/ACT inhaler Inhale 1-2 puffs into the lungs every 4 (four) hours as needed for wheezing or shortness of breath.    Marland Kitchen. aspirin 81 MG tablet Take 81 mg by mouth at bedtime.     Marland Kitchen. atorvastatin (LIPITOR) 40 MG tablet TAKE 1 TABLET BY MOUTH EVERY DAY 90 tablet 0  . clopidogrel (PLAVIX) 75 MG tablet TAKE 1 TABLET(75 MG) BY MOUTH AT BEDTIME 90 tablet 1  . CYANOCOBALAMIN PO Take 1 tablet by mouth at bedtime.    . furosemide (LASIX) 40 MG tablet Take 40 mg by mouth daily.  0  . glipiZIDE-metformin (METAGLIP) 5-500 MG tablet Take 2 tablets by mouth 2 (two) times daily before a meal.     . nitroGLYCERIN (NITROSTAT) 0.4 MG SL tablet Place 0.4 mg under the  tongue every 5 (five) minutes as needed for chest pain.    . ramipril (ALTACE) 5 MG capsule Take 5 mg by mouth daily.     No current facility-administered medications on file prior to visit.     No Known Allergies  Past Medical History:  Diagnosis Date  . Arthritis   . Benign prostatic hypertrophy   . BPH (benign prostatic hyperplasia)   . Coronary artery disease    status post PTCA and stenting of the left anterior descending artery  . Diabetes mellitus   . Dyspnea    with exertion   . Hyperlipidemia   . Hypertension   . Paralyzed hemidiaphragm    Left  . Pneumonia    50 years ago    Past Surgical History:  Procedure Laterality Date  . THULIUM LASER TURP (TRANSURETHRAL RESECTION OF PROSTATE) N/A 10/30/2016   Procedure: THULIUM LASER TURP (TRANSURETHRAL RESECTION OF PROSTATE);  Surgeon: Bjorn Pippin, MD;  Location: [facility];  Service: Urology;  Laterality: N/A;  . TRANSURETHRAL RESECTION OF PROSTATE N/A 05/05/2017   Procedure: TRANSURETHRAL RESECTION OF  THE PROSTATE (TURP);  Surgeon: Bjorn Pippin, MD;  Location: [facility];  Service: Urology;  Laterality: N/A;  NEEDS 60 MIN FOR PROCEDURE    Social History   Tobacco Use  Smoking Status Never Smoker  Smokeless Tobacco Never Used    Social History   Substance and Sexual Activity  Alcohol Use No    Family History  Problem Relation Age of Onset  . Cancer Father   . Cancer Brother     Reviw of Systems:  Noted in current history  Physical Exam: Blood pressure 124/62, pulse 79, height 5\' 5"  (1.651 m), weight 146 lb 6.4 oz (66.4 kg), SpO2 92 %.  GEN:  Well nourished, well developed in no acute distress HEENT: Normal NECK: No JVD; No carotid bruits LYMPHATICS: No lymphadenopathy CARDIAC: RRR  2/6 systolic murmur RESPIRATORY:   Reduced breath sounds left lung .  ABDOMEN: Soft, non-tender, non-distended MUSCULOSKELETAL:  No edema; No deformity  SKIN: Warm and dry NEUROLOGIC:  Alert and oriented x 3   ECG:   August 16, 2018: Normal sinus rhythm at 77.  Left axis deviation.  He has tremor artifact.    Assessment / Plan:   1. Coronary artery disease-status post PTCA and stenting of the LAD -    Not having any episodes of angina.  Continue current medication.  2. Paralyzed left hemidiaphragm -stable  3. Diabetes mellitus 4. Hypertension  :  BP is well controlled.   5. Hyperlipidemia   .  Continue current medications.  Will check labs today.  7. Chronic diastolic CHF : Having any signs or symptoms of heart failure.      Kristeen Miss, MD  08/16/2018 10:52 AM    [facility] [address] [place],  [address] [place], [place]  [address] Pager [phone] Phone: [phone]; Fax: [phone]

## 2018-09-10 ENCOUNTER — Other Ambulatory Visit: Payer: Self-pay

## 2018-09-10 ENCOUNTER — Inpatient Hospital Stay (HOSPITAL_COMMUNITY)
Admission: EM | Admit: 2018-09-10 | Discharge: 2018-09-13 | DRG: 190 | Disposition: A | Discharge status: Home Health | Payer: BLUE CROSS/BLUE SHIELD | Attending: Internal Medicine | Admitting: Internal Medicine

## 2018-09-10 ENCOUNTER — Encounter (HOSPITAL_COMMUNITY): Payer: Self-pay | Admitting: Emergency Medicine

## 2018-09-10 ENCOUNTER — Emergency Department (HOSPITAL_COMMUNITY): Payer: BLUE CROSS/BLUE SHIELD

## 2018-09-10 ENCOUNTER — Ambulatory Visit (HOSPITAL_COMMUNITY)
Admission: EM | Admit: 2018-09-10 | Discharge: 2018-09-10 | Disposition: A | Discharge status: Home / Self Care | Payer: Medicare Other | Attending: Internal Medicine | Admitting: Internal Medicine

## 2018-09-10 DIAGNOSIS — R0602 Shortness of breath: Secondary | ICD-10-CM | POA: Diagnosis not present

## 2018-09-10 DIAGNOSIS — J189 Pneumonia, unspecified organism: Secondary | ICD-10-CM

## 2018-09-10 DIAGNOSIS — E876 Hypokalemia: Secondary | ICD-10-CM | POA: Diagnosis present

## 2018-09-10 DIAGNOSIS — E1122 Type 2 diabetes mellitus with diabetic chronic kidney disease: Secondary | ICD-10-CM | POA: Diagnosis present

## 2018-09-10 DIAGNOSIS — R000 Tachycardia, unspecified: Secondary | ICD-10-CM | POA: Diagnosis not present

## 2018-09-10 DIAGNOSIS — Z7902 Long term (current) use of antithrombotics/antiplatelets: Secondary | ICD-10-CM | POA: Diagnosis not present

## 2018-09-10 DIAGNOSIS — I5033 Acute on chronic diastolic (congestive) heart failure: Secondary | ICD-10-CM | POA: Diagnosis not present

## 2018-09-10 DIAGNOSIS — R0689 Other abnormalities of breathing: Secondary | ICD-10-CM | POA: Diagnosis not present

## 2018-09-10 DIAGNOSIS — Z9079 Acquired absence of other genital organ(s): Secondary | ICD-10-CM | POA: Diagnosis not present

## 2018-09-10 DIAGNOSIS — R338 Other retention of urine: Secondary | ICD-10-CM | POA: Diagnosis present

## 2018-09-10 DIAGNOSIS — E785 Hyperlipidemia, unspecified: Secondary | ICD-10-CM | POA: Diagnosis present

## 2018-09-10 DIAGNOSIS — Z79899 Other long term (current) drug therapy: Secondary | ICD-10-CM | POA: Diagnosis not present

## 2018-09-10 DIAGNOSIS — I130 Hypertensive heart and chronic kidney disease with heart failure and stage 1 through stage 4 chronic kidney disease, or unspecified chronic kidney disease: Secondary | ICD-10-CM | POA: Diagnosis present

## 2018-09-10 DIAGNOSIS — R05 Cough: Secondary | ICD-10-CM | POA: Diagnosis not present

## 2018-09-10 DIAGNOSIS — J441 Chronic obstructive pulmonary disease with (acute) exacerbation: Secondary | ICD-10-CM | POA: Diagnosis not present

## 2018-09-10 DIAGNOSIS — N401 Enlarged prostate with lower urinary tract symptoms: Secondary | ICD-10-CM | POA: Diagnosis present

## 2018-09-10 DIAGNOSIS — Z7984 Long term (current) use of oral hypoglycemic drugs: Secondary | ICD-10-CM | POA: Diagnosis not present

## 2018-09-10 DIAGNOSIS — I2510 Atherosclerotic heart disease of native coronary artery without angina pectoris: Secondary | ICD-10-CM | POA: Diagnosis present

## 2018-09-10 DIAGNOSIS — Z7982 Long term (current) use of aspirin: Secondary | ICD-10-CM | POA: Diagnosis not present

## 2018-09-10 DIAGNOSIS — D631 Anemia in chronic kidney disease: Secondary | ICD-10-CM | POA: Diagnosis present

## 2018-09-10 DIAGNOSIS — E1165 Type 2 diabetes mellitus with hyperglycemia: Secondary | ICD-10-CM | POA: Diagnosis present

## 2018-09-10 DIAGNOSIS — E119 Type 2 diabetes mellitus without complications: Secondary | ICD-10-CM

## 2018-09-10 DIAGNOSIS — I509 Heart failure, unspecified: Secondary | ICD-10-CM

## 2018-09-10 DIAGNOSIS — R062 Wheezing: Secondary | ICD-10-CM | POA: Diagnosis not present

## 2018-09-10 DIAGNOSIS — I5031 Acute diastolic (congestive) heart failure: Secondary | ICD-10-CM | POA: Diagnosis not present

## 2018-09-10 DIAGNOSIS — J9601 Acute respiratory failure with hypoxia: Secondary | ICD-10-CM | POA: Diagnosis present

## 2018-09-10 DIAGNOSIS — Z7951 Long term (current) use of inhaled steroids: Secondary | ICD-10-CM | POA: Diagnosis not present

## 2018-09-10 DIAGNOSIS — J986 Disorders of diaphragm: Secondary | ICD-10-CM | POA: Diagnosis present

## 2018-09-10 DIAGNOSIS — N183 Chronic kidney disease, stage 3 (moderate): Secondary | ICD-10-CM | POA: Diagnosis present

## 2018-09-10 DIAGNOSIS — R0603 Acute respiratory distress: Secondary | ICD-10-CM

## 2018-09-10 DIAGNOSIS — E7800 Pure hypercholesterolemia, unspecified: Secondary | ICD-10-CM | POA: Diagnosis not present

## 2018-09-10 DIAGNOSIS — R339 Retention of urine, unspecified: Secondary | ICD-10-CM | POA: Diagnosis not present

## 2018-09-10 DIAGNOSIS — Z9861 Coronary angioplasty status: Secondary | ICD-10-CM | POA: Diagnosis not present

## 2018-09-10 HISTORY — DX: Chronic obstructive pulmonary disease, unspecified: J44.9

## 2018-09-10 LAB — CBC WITH DIFFERENTIAL/PLATELET
Abs Immature Granulocytes: 0 10*3/uL (ref 0.00–0.07)
Basophils Absolute: 0.2 10*3/uL — ABNORMAL HIGH (ref 0.0–0.1)
Basophils Relative: 2 %
Eosinophils Absolute: 0 10*3/uL (ref 0.0–0.5)
Eosinophils Relative: 0 %
HCT: 36.2 % — ABNORMAL LOW (ref 39.0–52.0)
Hemoglobin: 10.8 g/dL — ABNORMAL LOW (ref 13.0–17.0)
Lymphocytes Relative: 3 %
Lymphs Abs: 0.3 10*3/uL — ABNORMAL LOW (ref 0.7–4.0)
MCH: 26.7 pg (ref 26.0–34.0)
MCHC: 29.8 g/dL — ABNORMAL LOW (ref 30.0–36.0)
MCV: 89.6 fL (ref 80.0–100.0)
Monocytes Absolute: 1.2 10*3/uL — ABNORMAL HIGH (ref 0.1–1.0)
Monocytes Relative: 13 %
Neutro Abs: 7.8 10*3/uL — ABNORMAL HIGH (ref 1.7–7.7)
Neutrophils Relative %: 82 %
Platelets: 198 10*3/uL (ref 150–400)
RBC: 4.04 MIL/uL — ABNORMAL LOW (ref 4.22–5.81)
RDW: 16.2 % — ABNORMAL HIGH (ref 11.5–15.5)
WBC: 9.5 10*3/uL (ref 4.0–10.5)
nRBC: 0 % (ref 0.0–0.2)
nRBC: 0 /100 WBC

## 2018-09-10 LAB — COMPREHENSIVE METABOLIC PANEL
ALT: 15 U/L (ref 0–44)
AST: 19 U/L (ref 15–41)
Albumin: 3.6 g/dL (ref 3.5–5.0)
Alkaline Phosphatase: 78 U/L (ref 38–126)
Anion gap: 16 — ABNORMAL HIGH (ref 5–15)
BUN: 23 mg/dL (ref 8–23)
CO2: 26 mmol/L (ref 22–32)
Calcium: 8.2 mg/dL — ABNORMAL LOW (ref 8.9–10.3)
Chloride: 91 mmol/L — ABNORMAL LOW (ref 98–111)
Creatinine, Ser: 1.28 mg/dL — ABNORMAL HIGH (ref 0.61–1.24)
GFR calc Af Amer: >60 mL/min (ref >60)
GFR calc non Af Amer: 53 mL/min — ABNORMAL LOW (ref >60)
Glucose, Bld: 192 mg/dL — ABNORMAL HIGH (ref 70–99)
Potassium: 3.2 mmol/L — ABNORMAL LOW (ref 3.5–5.1)
Sodium: 133 mmol/L — ABNORMAL LOW (ref 135–145)
Total Bilirubin: 0.8 mg/dL (ref 0.3–1.2)
Total Protein: 6.3 g/dL — ABNORMAL LOW (ref 6.5–8.1)

## 2018-09-10 LAB — POCT I-STAT 7, (LYTES, BLD GAS, ICA,H+H)
Acid-Base Excess: 6 mmol/L — ABNORMAL HIGH (ref 0.0–2.0)
Bicarbonate: 33 mmol/L — ABNORMAL HIGH (ref 20.0–28.0)
Calcium, Ion: 1.14 mmol/L — ABNORMAL LOW (ref 1.15–1.40)
HCT: 33 % — ABNORMAL LOW (ref 39.0–52.0)
Hemoglobin: 11.2 g/dL — ABNORMAL LOW (ref 13.0–17.0)
O2 Saturation: 100 %
Patient temperature: 98.6
Potassium: 3.1 mmol/L — ABNORMAL LOW (ref 3.5–5.1)
Sodium: 132 mmol/L — ABNORMAL LOW (ref 135–145)
TCO2: 35 mmol/L — ABNORMAL HIGH (ref 22–32)
pCO2 arterial: 56.9 mmHg — ABNORMAL HIGH (ref 32.0–48.0)
pH, Arterial: 7.372 (ref 7.350–7.450)
pO2, Arterial: 346 mmHg — ABNORMAL HIGH (ref 83.0–108.0)

## 2018-09-10 LAB — BRAIN NATRIURETIC PEPTIDE: B Natriuretic Peptide: 190.7 pg/mL — ABNORMAL HIGH (ref 0.0–100.0)

## 2018-09-10 LAB — LACTIC ACID, PLASMA: Lactic Acid, Venous: 1.7 mmol/L (ref 0.5–1.9)

## 2018-09-10 MED ORDER — ALBUTEROL SULFATE (2.5 MG/3ML) 0.083% IN NEBU
5.0000 mg | INHALATION_SOLUTION | Freq: Once | RESPIRATORY_TRACT | Status: AC
  Administered 2018-09-10: 5 mg via RESPIRATORY_TRACT
  Filled 2018-09-10: qty 6

## 2018-09-10 MED ORDER — SODIUM CHLORIDE 0.9 % IV SOLN
2.0000 g | INTRAVENOUS | Status: DC
  Administered 2018-09-10: 2 g via INTRAVENOUS
  Filled 2018-09-10 (×2): qty 20

## 2018-09-10 MED ORDER — IPRATROPIUM-ALBUTEROL 0.5-2.5 (3) MG/3ML IN SOLN
RESPIRATORY_TRACT | Status: AC
  Filled 2018-09-10: qty 3

## 2018-09-10 MED ORDER — IPRATROPIUM-ALBUTEROL 0.5-2.5 (3) MG/3ML IN SOLN
3.0000 mL | Freq: Once | RESPIRATORY_TRACT | Status: AC
  Administered 2018-09-10: 3 mL via RESPIRATORY_TRACT

## 2018-09-10 MED ORDER — ACETAMINOPHEN 325 MG PO TABS
650.0000 mg | ORAL_TABLET | Freq: Once | ORAL | Status: AC
  Administered 2018-09-10: 650 mg via ORAL
  Filled 2018-09-10: qty 2

## 2018-09-10 MED ORDER — SODIUM CHLORIDE 0.9 % IV SOLN
500.0000 mg | INTRAVENOUS | Status: DC
  Administered 2018-09-10: 500 mg via INTRAVENOUS
  Filled 2018-09-10 (×2): qty 500

## 2018-09-10 MED ORDER — IPRATROPIUM BROMIDE 0.02 % IN SOLN
0.5000 mg | Freq: Once | RESPIRATORY_TRACT | Status: AC
  Administered 2018-09-10: 0.5 mg via RESPIRATORY_TRACT
  Filled 2018-09-10: qty 2.5

## 2018-09-10 MED ORDER — METHYLPREDNISOLONE SODIUM SUCC 125 MG IJ SOLR
125.0000 mg | Freq: Once | INTRAMUSCULAR | Status: AC
  Administered 2018-09-10: 125 mg via INTRAVENOUS
  Filled 2018-09-10: qty 2

## 2018-09-10 NOTE — ED Triage Notes (Signed)
Pt to ED via [facility] from Urgent Care with c/o shortness of breath.  Pt's daughter st's pt has had cold type symptoms for several days.  Pt was given a duo neb enroute to ED.

## 2018-09-10 NOTE — ED Notes (Signed)
EMS arrived in room, report given.

## 2018-09-10 NOTE — Discharge Instructions (Signed)
You will be transported to the ED by EMS

## 2018-09-10 NOTE — ED Provider Notes (Signed)
[facility]    CSN: [id number] Arrival date & time: 09/10/18  1815     History   Chief Complaint No chief complaint on file.   HPI Angel [name] is a 83 y.o. male.   83 yo male with history CAD, DM 2, HTN, and left hemidiaphragm paralysis presents to urgent care with respiratory distress. Daughter reports that he was confused earleir today.  He has also had a cough.  He denies chest pain. SpO2 upon arrival in 60's as well as respiratory rate.       Past Medical History:  Diagnosis Date  . Arthritis   . Benign prostatic hypertrophy   . BPH (benign prostatic hyperplasia)   . Coronary artery disease    status post PTCA and stenting of the left anterior descending artery  . Diabetes mellitus   . Dyspnea    with exertion   . Hyperlipidemia   . Hypertension   . Paralyzed hemidiaphragm    Left  . Pneumonia    50 years ago    Patient Active Problem List   Diagnosis Date Noted  . BPH with obstruction/lower urinary tract symptoms 05/05/2017  . Nonrheumatic aortic valve stenosis 10/03/2016  . Chronic diastolic CHF (congestive heart failure) (HCC) 10/03/2016  . Acute bronchitis   . Diaphragm paralysis   . Acute encephalopathy   . Influenza with pneumonia   . Acute urinary retention   . Chest pain on breathing   . Acute on chronic respiratory failure with hypoxemia (HCC)   . Acute respiratory failure with hypoxia (HCC) 08/12/2016  . Influenza 08/12/2016  . Hypertensive heart disease without heart failure   . Demand ischemia (HCC)   . S/P coronary artery stent placement   . COPD exacerbation (HCC) 08/11/2016  . Diabetes mellitus (HCC) 10/06/2011  . HTN (hypertension) 10/06/2011  . Hyperlipidemia 10/06/2011  . CAD (coronary artery disease) 04/07/2011    Past Surgical History:  Procedure Laterality Date  . THULIUM LASER TURP (TRANSURETHRAL RESECTION OF PROSTATE) N/A 10/30/2016   Procedure: THULIUM LASER TURP (TRANSURETHRAL RESECTION OF PROSTATE);  Surgeon:  Bjorn Pippin, MD;  Location: [facility];  Service: Urology;  Laterality: N/A;  . TRANSURETHRAL RESECTION OF PROSTATE N/A 05/05/2017   Procedure: TRANSURETHRAL RESECTION OF THE PROSTATE (TURP);  Surgeon: Bjorn Pippin, MD;  Location: [facility];  Service: Urology;  Laterality: N/A;  NEEDS 60 MIN FOR PROCEDURE       Home Medications    Prior to Admission medications   Medication Sig Start Date End Date Taking? Authorizing Provider  albuterol (PROVENTIL HFA;VENTOLIN HFA) 108 (90 Base) MCG/ACT inhaler Inhale 1-2 puffs into the lungs every 4 (four) hours as needed for wheezing or shortness of breath.    [provider]  aspirin 81 MG tablet Take 81 mg by mouth at bedtime.     [provider]  atorvastatin (LIPITOR) 40 MG tablet TAKE 1 TABLET BY MOUTH EVERY DAY 02/11/18   Nahser, Deloris Ping, MD  clopidogrel (PLAVIX) 75 MG tablet TAKE 1 TABLET(75 MG) BY MOUTH AT BEDTIME 08/13/18   Nahser, Deloris Ping, MD  CYANOCOBALAMIN PO Take 1 tablet by mouth at bedtime.    [provider]  furosemide (LASIX) 40 MG tablet Take 40 mg by mouth daily. 06/27/17   [provider]  glipiZIDE-metformin (METAGLIP) 5-500 MG tablet Take 2 tablets by mouth 2 (two) times daily before a meal.     [provider]  nitroGLYCERIN (NITROSTAT) 0.4 MG SL tablet Place 0.4 mg  under the tongue every 5 (five) minutes as needed for chest pain.    [provider]  ramipril (ALTACE) 5 MG capsule Take 5 mg by mouth daily.    [provider]    Family History Family History  Problem Relation Age of Onset  . Cancer Father   . Cancer Brother     Social History Social History   Tobacco Use  . Smoking status: Never Smoker  . Smokeless tobacco: Never Used  Substance Use Topics  . Alcohol use: No  . Drug use: No     Allergies   Patient has no known allergies.   Review of Systems Review of Systems  Constitutional: Negative for chills and fever.  HENT: Negative for sore throat and  tinnitus.   Eyes: Negative for redness.  Respiratory: Positive for shortness of breath. Negative for cough.   Cardiovascular: Negative for chest pain and palpitations.  Gastrointestinal: Negative for abdominal pain, diarrhea, nausea and vomiting.  Genitourinary: Negative for dysuria, frequency and urgency.  Musculoskeletal: Negative for myalgias.  Skin: Negative for rash.       No lesions  Neurological: Negative for weakness.  Hematological: Does not bruise/bleed easily.  Psychiatric/Behavioral: Negative for suicidal ideas.     Physical Exam Triage Vital Signs ED Triage Vitals [09/10/18 1845]  Enc Vitals Group     BP 116/62     Pulse Rate (!) 107     Resp (!) 50     Temp 100.1 F (37.8 C)     Temp src      SpO2 (!) 63 %     Weight      Height      Head Circumference      Peak Flow      Pain Score      Pain Loc      Pain Edu?      Excl. in GC?    No data found.  Updated Vital Signs BP 116/62   Pulse (!) 107   Temp 100.1 F (37.8 C)   Resp (!) 50   SpO2 92%   Visual Acuity Right Eye Distance:   Left Eye Distance:   Bilateral Distance:    Right Eye Near:   Left Eye Near:    Bilateral Near:     Physical Exam Constitutional:      General: He is not in acute distress.    Appearance: He is well-developed.  HENT:     Head: Normocephalic and atraumatic.  Eyes:     General: No scleral icterus.    Conjunctiva/sclera: Conjunctivae normal.     Pupils: Pupils are equal, round, and reactive to light.  Neck:     Musculoskeletal: Normal range of motion and neck supple.     Thyroid: No thyromegaly.     Vascular: No JVD.     Trachea: No tracheal deviation.  Cardiovascular:     Rate and Rhythm: Normal rate and regular rhythm.     Heart sounds: Normal heart sounds. No murmur. No friction rub. No gallop.   Pulmonary:     Effort: Respiratory distress present.     Breath sounds: Wheezing and rales present.  Abdominal:     General: Bowel sounds are normal. There is  no distension.     Palpations: Abdomen is soft.     Tenderness: There is no abdominal tenderness.  Musculoskeletal: Normal range of motion.  Lymphadenopathy:     Cervical: No cervical adenopathy.  Skin:    General:  Skin is warm and dry.     Findings: No erythema or rash.  Neurological:     Mental Status: He is alert and oriented to person, place, and time.     Cranial Nerves: No cranial nerve deficit.  Psychiatric:        Behavior: Behavior normal.        Thought Content: Thought content normal.        Judgment: Judgment normal.      UC Treatments / Results  Labs (all labs ordered are listed, but only abnormal results are displayed) Labs Reviewed - No data to display  EKG None  Radiology No results found.  Procedures Procedures (including critical care time)  Medications Ordered in UC Medications  ipratropium-albuterol (DUONEB) 0.5-2.5 (3) MG/3ML nebulizer solution 3 mL (3 mLs Nebulization Given 09/10/18 1848)    Initial Impression / Assessment and Plan / UC Course  I have reviewed the triage vital signs and the nursing notes.  Pertinent labs & imaging results that were available during my care of the patient were reviewed by me and considered in my medical decision making (see chart for details).     Started O2 by NRB and DuoNeb.  Patient sent to ED via EMS.    Final Clinical Impressions(s) / UC Diagnoses   Final diagnoses:  Respiratory distress     Discharge Instructions     You will be transported to the ED by EMS    ED Prescriptions    None     Controlled Substance Prescriptions [place] Controlled Substance Registry consulted? Not Applicable   Arnaldo Nataliamond, Dawnielle Christiana S, MD 09/10/18 Avanell [id number]

## 2018-09-10 NOTE — ED Provider Notes (Signed)
[facility] EMERGENCY DEPARTMENT Provider Note   CSN: [id number] Arrival date & time: 09/10/18  1929    History   Chief Complaint Chief Complaint  Patient presents with  . Shortness of Breath    HPI Angel [name] is a 83 y.o. male.     HPI  83 year old male presents with dyspnea.  History is taken with the help of daughter as the patient speaks [place].  Patient has been feeling progressively worse over the last 5 days.  Started off as sore throat and then progressed to chest congestion.  Is coughing but no productive sputum.  Unknown about fevers at home but was found to have 100.1 temperature at urgent care.  Is also found to have O2 sats in the 60s and was placed on breathing treatment and sent here.  He feels a little bit better after the breathing treatment.  He does not typically wear oxygen.  No leg swelling.  Past Medical History:  Diagnosis Date  . Arthritis   . Benign prostatic hypertrophy   . BPH (benign prostatic hyperplasia)   . COPD (chronic obstructive pulmonary disease) (HCC)   . Coronary artery disease    status post PTCA and stenting of the left anterior descending artery  . Diabetes mellitus   . Dyspnea    with exertion   . Hyperlipidemia   . Hypertension   . Paralyzed hemidiaphragm    Left  . Pneumonia    50 years ago    Patient Active Problem List   Diagnosis Date Noted  . COPD with acute exacerbation (HCC) 09/10/2018  . BPH with obstruction/lower urinary tract symptoms 05/05/2017  . Nonrheumatic aortic valve stenosis 10/03/2016  . Chronic diastolic CHF (congestive heart failure) (HCC) 10/03/2016  . Acute bronchitis   . Diaphragm paralysis   . Acute encephalopathy   . Influenza with pneumonia   . Acute urinary retention   . Chest pain on breathing   . Acute on chronic respiratory failure with hypoxemia (HCC)   . Acute respiratory failure with hypoxia (HCC) 08/12/2016  . Influenza 08/12/2016  . Hypertensive heart disease  without heart failure   . Demand ischemia (HCC)   . S/P coronary artery stent placement   . COPD exacerbation (HCC) 08/11/2016  . Diabetes mellitus (HCC) 10/06/2011  . HTN (hypertension) 10/06/2011  . Hyperlipidemia 10/06/2011  . CAD (coronary artery disease) 04/07/2011    Past Surgical History:  Procedure Laterality Date  . THULIUM LASER TURP (TRANSURETHRAL RESECTION OF PROSTATE) N/A 10/30/2016   Procedure: THULIUM LASER TURP (TRANSURETHRAL RESECTION OF PROSTATE);  Surgeon: Bjorn Pippin, MD;  Location: [facility];  Service: Urology;  Laterality: N/A;  . TRANSURETHRAL RESECTION OF PROSTATE N/A 05/05/2017   Procedure: TRANSURETHRAL RESECTION OF THE PROSTATE (TURP);  Surgeon: Bjorn Pippin, MD;  Location: [facility];  Service: Urology;  Laterality: N/A;  NEEDS 60 MIN FOR PROCEDURE        Home Medications    Prior to Admission medications   Medication Sig Start Date End Date Taking? Authorizing Provider  acetaminophen (TYLENOL) 500 MG tablet Take 1,000 mg by mouth 2 (two) times daily.   Yes [provider]  albuterol (PROVENTIL HFA;VENTOLIN HFA) 108 (90 Base) MCG/ACT inhaler Inhale 1-2 puffs into the lungs every 4 (four) hours as needed for wheezing or shortness of breath.   Yes [provider]  aspirin 81 MG tablet Take 81 mg by mouth at bedtime.    Yes [provider]  clopidogrel (PLAVIX) 75  MG tablet TAKE 1 TABLET(75 MG) BY MOUTH AT BEDTIME Patient taking differently: Take 75 mg by mouth at bedtime.  08/13/18  Yes Nahser, Deloris Ping, MD  furosemide (LASIX) 20 MG tablet Take 20 mg by mouth at bedtime.   Yes [provider]  glipiZIDE-metformin (METAGLIP) 5-500 MG tablet Take 2 tablets by mouth 2 (two) times daily before a meal.    Yes [provider]  Methylcobalamin (B-12) 5000 MCG TBDP Take 5,000 mcg by mouth at bedtime.   Yes [provider]  nitroGLYCERIN (NITROSTAT) 0.4 MG SL tablet Place 0.4 mg under the tongue every 5 (five) minutes as  needed for chest pain.   Yes [provider]  Omega-3 Fatty Acids (FISH OIL) 1200 MG CAPS Take 1,200 mg by mouth at bedtime.   Yes [provider]  ramipril (ALTACE) 2.5 MG capsule Take 2.5 mg by mouth at bedtime.   Yes [provider]  rosuvastatin (CRESTOR) 20 MG tablet Take 20 mg by mouth at bedtime. 08/12/18  Yes [provider]  atorvastatin (LIPITOR) 40 MG tablet TAKE 1 TABLET BY MOUTH EVERY DAY Patient not taking: No sig reported 02/11/18   Nahser, Deloris Ping, MD    Family History Family History  Problem Relation Age of Onset  . Cancer Father   . Cancer Brother     Social History Social History   Tobacco Use  . Smoking status: Never Smoker  . Smokeless tobacco: Never Used  Substance Use Topics  . Alcohol use: No  . Drug use: No     Allergies   Patient has no known allergies.   Review of Systems Review of Systems  Constitutional: Positive for fever.  HENT: Positive for congestion and sore throat.   Respiratory: Positive for cough, shortness of breath and wheezing.   Cardiovascular: Negative for chest pain and leg swelling.  Gastrointestinal: Negative for vomiting.  All other systems reviewed and are negative.    Physical Exam Updated Vital Signs BP (!) 110/49   Pulse 86   Temp 98.6 F (37 C) (Oral)   Resp (!) 30   Ht 5\' 5"  (1.651 m)   Wt 65.8 kg   SpO2 99%   BMI 24.13 kg/m   Physical Exam Vitals signs and nursing note reviewed.  Constitutional:      General: He is not in acute distress.    Appearance: He is well-developed. He is ill-appearing.  HENT:     Head: Normocephalic and atraumatic.     Right Ear: External ear normal.     Left Ear: External ear normal.     Nose: Nose normal.  Eyes:     General:        Right eye: No discharge.        Left eye: No discharge.  Neck:     Musculoskeletal: Neck supple.  Cardiovascular:     Rate and Rhythm: Regular rhythm. Tachycardia present.     Heart sounds: Normal heart  sounds.  Pulmonary:     Effort: Tachypnea present. No accessory muscle usage.     Breath sounds: Examination of the right-lower field reveals rales. Decreased breath sounds and rales present.  Abdominal:     Palpations: Abdomen is soft.     Tenderness: There is no abdominal tenderness.  Musculoskeletal:     Right lower leg: No edema.     Left lower leg: No edema.  Skin:    General: Skin is warm and dry.  Neurological:  Mental Status: He is alert.  Psychiatric:        Mood and Affect: Mood is not anxious.      ED Treatments / Results  Labs (all labs ordered are listed, but only abnormal results are displayed) Labs Reviewed  COMPREHENSIVE METABOLIC PANEL - Abnormal; Notable for the following components:      Result Value   Sodium 133 (*)    Potassium 3.2 (*)    Chloride 91 (*)    Glucose, Bld 192 (*)    Creatinine, Ser 1.28 (*)    Calcium 8.2 (*)    Total Protein 6.3 (*)    GFR calc non Af Amer 53 (*)    Anion gap 16 (*)    All other components within normal limits  CBC WITH DIFFERENTIAL/PLATELET - Abnormal; Notable for the following components:   RBC 4.04 (*)    Hemoglobin 10.8 (*)    HCT 36.2 (*)    MCHC 29.8 (*)    RDW 16.2 (*)    Neutro Abs 7.8 (*)    Lymphs Abs 0.3 (*)    Monocytes Absolute 1.2 (*)    Basophils Absolute 0.2 (*)    All other components within normal limits  BRAIN NATRIURETIC PEPTIDE - Abnormal; Notable for the following components:   B Natriuretic Peptide 190.7 (*)    All other components within normal limits  POCT I-STAT 7, (LYTES, BLD GAS, ICA,H+H) - Abnormal; Notable for the following components:   pCO2 arterial 56.9 (*)    pO2, Arterial 346.0 (*)    Bicarbonate 33.0 (*)    TCO2 35 (*)    Acid-Base Excess 6.0 (*)    Sodium 132 (*)    Potassium 3.1 (*)    Calcium, Ion 1.14 (*)    HCT 33.0 (*)    Hemoglobin 11.2 (*)    All other components within normal limits  CULTURE, BLOOD (ROUTINE X 2)  CULTURE, BLOOD (ROUTINE X 2)  LACTIC  ACID, PLASMA  URINALYSIS, ROUTINE W REFLEX MICROSCOPIC  INFLUENZA PANEL BY PCR (TYPE A & B)  BASIC METABOLIC PANEL  MAGNESIUM  HEMOGLOBIN A1C  PROCALCITONIN    EKG None  ED ECG REPORT   Date: 09/11/2018  Rate: 105  Rhythm: sinus tachycardia  QRS Axis: normal  Intervals: normal  ST/T Wave abnormalities: nonspecific T wave changes  Conduction Disutrbances:none  Narrative Interpretation:   Old EKG Reviewed: unchanged  I have personally reviewed the EKG tracing and agree with the computerized printout as noted.  Radiology Dg Chest Port 1 View  Result Date: 09/10/2018 CLINICAL DATA:  Cough and hypoxia EXAM: PORTABLE CHEST 1 VIEW COMPARISON:  05/01/2017 FINDINGS: Chronic elevation of left diaphragm. Enlarged cardiomediastinal silhouette with vascular congestion and diffuse interstitial prominence suggesting edema. Probable trace pleural effusions. IMPRESSION: 1. Enlarged cardiomediastinal silhouette with vascular congestion and diffuse interstitial opacity suspect for edema. Probable trace pleural effusion 2. Chronic elevation of left diaphragm Electronically Signed   By: Jasmine PangKim  Fujinaga M.D.   On: 09/10/2018 20:26    Procedures .Critical Care Performed by: Pricilla LovelessGoldston, Brazil Voytko, MD Authorized by: Pricilla LovelessGoldston, Matias Thurman, MD   Critical care provider statement:    Critical care time (minutes):  30   Critical care time was exclusive of:  Separately billable procedures and treating other patients   Critical care was necessary to treat or prevent imminent or life-threatening deterioration of the following conditions:  Respiratory failure   Critical care was time spent personally by me on the following activities:  Development  of treatment plan with patient or surrogate, discussions with consultants, evaluation of patient's response to treatment, examination of patient, obtaining history from patient or surrogate, ordering and performing treatments and interventions, ordering and review of laboratory  studies, ordering and review of radiographic studies, pulse oximetry, re-evaluation of patient's condition and review of old charts   (including critical care time)  Medications Ordered in ED Medications  cefTRIAXone (ROCEPHIN) 2 g in sodium chloride 0.9 % 100 mL IVPB (0 g Intravenous Stopped 09/10/18 2222)  azithromycin (ZITHROMAX) 500 mg in sodium chloride 0.9 % 250 mL IVPB (500 mg Intravenous New Bag/Given 09/10/18 2226)  aspirin chewable tablet 81 mg (has no administration in time range)  rosuvastatin (CRESTOR) tablet 20 mg (has no administration in time range)  clopidogrel (PLAVIX) tablet 75 mg (has no administration in time range)  vitamin B-12 (CYANOCOBALAMIN) tablet 5,000 mcg (has no administration in time range)  enoxaparin (LOVENOX) injection 40 mg (has no administration in time range)  acetaminophen (TYLENOL) tablet 650 mg (has no administration in time range)    Or  acetaminophen (TYLENOL) suppository 650 mg (has no administration in time range)  furosemide (LASIX) injection 40 mg (has no administration in time range)  potassium chloride SA (K-DUR,KLOR-CON) CR tablet 40 mEq (has no administration in time range)  insulin aspart (novoLOG) injection 0-9 Units (has no administration in time range)  budesonide (PULMICORT) nebulizer solution 0.25 mg (0.25 mg Nebulization Not Given 09/11/18 0039)  levalbuterol (XOPENEX) nebulizer solution 0.63 mg (has no administration in time range)  ipratropium (ATROVENT) nebulizer solution 0.5 mg (has no administration in time range)  methylPREDNISolone sodium succinate (SOLU-MEDROL) 125 mg/2 mL injection 60 mg (has no administration in time range)  acetaminophen (TYLENOL) tablet 650 mg (650 mg Oral Given 09/10/18 2054)  methylPREDNISolone sodium succinate (SOLU-MEDROL) 125 mg/2 mL injection 125 mg (125 mg Intravenous Given 09/10/18 2055)  albuterol (PROVENTIL) (2.5 MG/3ML) 0.083% nebulizer solution 5 mg (5 mg Nebulization Given 09/10/18 2023)    ipratropium (ATROVENT) nebulizer solution 0.5 mg (0.5 mg Nebulization Given 09/10/18 2023)  albuterol (PROVENTIL) (2.5 MG/3ML) 0.083% nebulizer solution 5 mg (5 mg Nebulization Given 09/10/18 2359)     Initial Impression / Assessment and Plan / ED Course  I have reviewed the triage vital signs and the nursing notes.  Pertinent labs & imaging results that were available during my care of the patient were reviewed by me and considered in my medical decision making (see chart for details).        Patient presents with what appears to be acute pneumonia versus COPD exacerbation.  Radiology calls x-ray edema but this would make more clinical signs to be pneumonia.  He has been treated for pneumonia with Rocephin and azithromycin as well as for COPD with albuterol and Solu-Medrol.  He is breathing a little better and at one point I considered BiPAP but he seems to be improving.  He will need admission for further respiratory support.  Final Clinical Impressions(s) / ED Diagnoses   Final diagnoses:  COPD exacerbation (HCC)  Community acquired pneumonia, unspecified laterality    ED Discharge Orders    None       Pricilla Loveless, MD 09/11/18 [phone]

## 2018-09-10 NOTE — ED Triage Notes (Signed)
Pt here for SOBper family member, pt c/o cold since Monday, has gotten worse over the week.

## 2018-09-11 ENCOUNTER — Other Ambulatory Visit (HOSPITAL_COMMUNITY): Payer: Medicare Other

## 2018-09-11 ENCOUNTER — Inpatient Hospital Stay (HOSPITAL_COMMUNITY): Payer: BLUE CROSS/BLUE SHIELD

## 2018-09-11 DIAGNOSIS — E876 Hypokalemia: Secondary | ICD-10-CM

## 2018-09-11 DIAGNOSIS — I509 Heart failure, unspecified: Secondary | ICD-10-CM

## 2018-09-11 DIAGNOSIS — I5031 Acute diastolic (congestive) heart failure: Secondary | ICD-10-CM

## 2018-09-11 HISTORY — DX: Heart failure, unspecified: I50.9

## 2018-09-11 HISTORY — DX: Hypokalemia: E87.6

## 2018-09-11 LAB — URINALYSIS, ROUTINE W REFLEX MICROSCOPIC
Bilirubin Urine: NEGATIVE
Glucose, UA: >=500 mg/dL — AB
Hgb urine dipstick: NEGATIVE
Ketones, ur: NEGATIVE mg/dL
Leukocytes,Ua: NEGATIVE
Nitrite: NEGATIVE
Protein, ur: 30 mg/dL — AB
Specific Gravity, Urine: 1.01 (ref 1.005–1.030)
pH: 5 (ref 5.0–8.0)

## 2018-09-11 LAB — PROCALCITONIN: Procalcitonin: 0.18 ng/mL

## 2018-09-11 LAB — HEMOGLOBIN A1C
Hgb A1c MFr Bld: 7 % — ABNORMAL HIGH (ref 4.8–5.6)
Mean Plasma Glucose: 154.2 mg/dL

## 2018-09-11 LAB — BLOOD GAS, ARTERIAL
Acid-Base Excess: 6.8 mmol/L — ABNORMAL HIGH (ref 0.0–2.0)
Bicarbonate: 32.1 mmol/L — ABNORMAL HIGH (ref 20.0–28.0)
Delivery systems: POSITIVE
Drawn by: 511471
Expiratory PAP: 6
FIO2: 40
Inspiratory PAP: 12
Mode: POSITIVE
O2 Saturation: 95.2 %
Patient temperature: 97.8
RATE: 8 resp/min
pCO2 arterial: 56 mmHg — ABNORMAL HIGH (ref 32.0–48.0)
pH, Arterial: 7.373 (ref 7.350–7.450)
pO2, Arterial: 75 mmHg — ABNORMAL LOW (ref 83.0–108.0)

## 2018-09-11 LAB — ECHOCARDIOGRAM COMPLETE
Height: 65 in
Weight: 2296.31 oz

## 2018-09-11 LAB — BASIC METABOLIC PANEL
Anion gap: 15 (ref 5–15)
BUN: 24 mg/dL — ABNORMAL HIGH (ref 8–23)
CO2: 29 mmol/L (ref 22–32)
Calcium: 8.4 mg/dL — ABNORMAL LOW (ref 8.9–10.3)
Chloride: 91 mmol/L — ABNORMAL LOW (ref 98–111)
Creatinine, Ser: 1.41 mg/dL — ABNORMAL HIGH (ref 0.61–1.24)
GFR calc Af Amer: 54 mL/min — ABNORMAL LOW (ref >60)
GFR calc non Af Amer: 47 mL/min — ABNORMAL LOW (ref >60)
Glucose, Bld: 356 mg/dL — ABNORMAL HIGH (ref 70–99)
Potassium: 3.3 mmol/L — ABNORMAL LOW (ref 3.5–5.1)
Sodium: 135 mmol/L (ref 135–145)

## 2018-09-11 LAB — GLUCOSE, CAPILLARY
Glucose-Capillary: 298 mg/dL — ABNORMAL HIGH (ref 70–99)
Glucose-Capillary: 198 mg/dL — ABNORMAL HIGH (ref 70–99)
Glucose-Capillary: 275 mg/dL — ABNORMAL HIGH (ref 70–99)
Glucose-Capillary: 212 mg/dL — ABNORMAL HIGH (ref 70–99)

## 2018-09-11 LAB — MAGNESIUM: Magnesium: 1.6 mg/dL — ABNORMAL LOW (ref 1.7–2.4)

## 2018-09-11 MED ORDER — ACETAMINOPHEN 325 MG PO TABS: 650.0000 mg | ORAL_TABLET | Freq: Four times a day (QID) | ORAL | Status: DC | PRN

## 2018-09-11 MED ORDER — ACETAMINOPHEN 650 MG RE SUPP: 650.0000 mg | Freq: Four times a day (QID) | RECTAL | Status: DC | PRN

## 2018-09-11 MED ORDER — POTASSIUM CHLORIDE CRYS ER 20 MEQ PO TBCR
40.0000 meq | EXTENDED_RELEASE_TABLET | Freq: Once | ORAL | Status: AC
  Administered 2018-09-11: 40 meq via ORAL
  Filled 2018-09-11: qty 2

## 2018-09-11 MED ORDER — TAMSULOSIN HCL 0.4 MG PO CAPS
0.4000 mg | ORAL_CAPSULE | Freq: Every day | ORAL | Status: DC
  Administered 2018-09-11 – 2018-09-13 (×3): 0.4 mg via ORAL
  Filled 2018-09-11 (×3): qty 1

## 2018-09-11 MED ORDER — IPRATROPIUM-ALBUTEROL 0.5-2.5 (3) MG/3ML IN SOLN
3.0000 mL | Freq: Four times a day (QID) | RESPIRATORY_TRACT | Status: DC
  Administered 2018-09-11 – 2018-09-13 (×9): 3 mL via RESPIRATORY_TRACT
  Filled 2018-09-11 (×9): qty 3

## 2018-09-11 MED ORDER — ROSUVASTATIN CALCIUM 20 MG PO TABS
20.0000 mg | ORAL_TABLET | Freq: Every day | ORAL | Status: DC
  Administered 2018-09-11 – 2018-09-12 (×3): 20 mg via ORAL
  Filled 2018-09-11 (×3): qty 1

## 2018-09-11 MED ORDER — FUROSEMIDE 10 MG/ML IJ SOLN
40.0000 mg | Freq: Two times a day (BID) | INTRAMUSCULAR | Status: DC
  Administered 2018-09-11: 40 mg via INTRAVENOUS
  Filled 2018-09-11: qty 4

## 2018-09-11 MED ORDER — BUDESONIDE 0.25 MG/2ML IN SUSP
0.2500 mg | Freq: Two times a day (BID) | RESPIRATORY_TRACT | Status: DC
  Administered 2018-09-11 – 2018-09-13 (×5): 0.25 mg via RESPIRATORY_TRACT
  Filled 2018-09-11 (×7): qty 2

## 2018-09-11 MED ORDER — INSULIN ASPART 100 UNIT/ML ~~LOC~~ SOLN
0.0000 [IU] | Freq: Three times a day (TID) | SUBCUTANEOUS | Status: DC
  Administered 2018-09-11: 2 [IU] via SUBCUTANEOUS
  Administered 2018-09-11: 5 [IU] via SUBCUTANEOUS
  Administered 2018-09-12: 7 [IU] via SUBCUTANEOUS
  Administered 2018-09-12: 3 [IU] via SUBCUTANEOUS
  Administered 2018-09-12: 5 [IU] via SUBCUTANEOUS
  Administered 2018-09-13: 2 [IU] via SUBCUTANEOUS
  Administered 2018-09-13: 5 [IU] via SUBCUTANEOUS

## 2018-09-11 MED ORDER — INSULIN ASPART 100 UNIT/ML ~~LOC~~ SOLN
0.0000 [IU] | Freq: Three times a day (TID) | SUBCUTANEOUS | Status: DC
  Administered 2018-09-11: 5 [IU] via SUBCUTANEOUS

## 2018-09-11 MED ORDER — ENOXAPARIN SODIUM 40 MG/0.4ML ~~LOC~~ SOLN
40.0000 mg | SUBCUTANEOUS | Status: DC
  Administered 2018-09-11 – 2018-09-12 (×2): 40 mg via SUBCUTANEOUS
  Filled 2018-09-11 (×2): qty 0.4

## 2018-09-11 MED ORDER — METHYLPREDNISOLONE SODIUM SUCC 125 MG IJ SOLR
60.0000 mg | Freq: Two times a day (BID) | INTRAMUSCULAR | Status: DC
  Administered 2018-09-11 – 2018-09-12 (×3): 60 mg via INTRAVENOUS
  Filled 2018-09-11 (×3): qty 2

## 2018-09-11 MED ORDER — ASPIRIN 81 MG PO CHEW
81.0000 mg | CHEWABLE_TABLET | Freq: Every day | ORAL | Status: DC
  Administered 2018-09-11 – 2018-09-12 (×3): 81 mg via ORAL
  Filled 2018-09-11 (×3): qty 1

## 2018-09-11 MED ORDER — FUROSEMIDE 10 MG/ML IJ SOLN
40.0000 mg | Freq: Two times a day (BID) | INTRAMUSCULAR | Status: AC
  Administered 2018-09-11: 40 mg via INTRAVENOUS
  Filled 2018-09-11: qty 4

## 2018-09-11 MED ORDER — IPRATROPIUM BROMIDE 0.02 % IN SOLN
0.5000 mg | Freq: Four times a day (QID) | RESPIRATORY_TRACT | Status: DC
  Administered 2018-09-11 (×2): 0.5 mg via RESPIRATORY_TRACT
  Filled 2018-09-11 (×2): qty 2.5

## 2018-09-11 MED ORDER — VITAMIN B-12 1000 MCG PO TABS
5000.0000 ug | ORAL_TABLET | Freq: Every day | ORAL | Status: DC
  Administered 2018-09-11 – 2018-09-12 (×3): 5000 ug via ORAL
  Filled 2018-09-11 (×3): qty 5

## 2018-09-11 MED ORDER — MAGNESIUM SULFATE IN D5W 1-5 GM/100ML-% IV SOLN
1.0000 g | Freq: Once | INTRAVENOUS | Status: AC
  Administered 2018-09-11: 1 g via INTRAVENOUS
  Filled 2018-09-11: qty 100

## 2018-09-11 MED ORDER — CLOPIDOGREL BISULFATE 75 MG PO TABS
75.0000 mg | ORAL_TABLET | Freq: Every day | ORAL | Status: DC
  Administered 2018-09-11 – 2018-09-12 (×3): 75 mg via ORAL
  Filled 2018-09-11 (×3): qty 1

## 2018-09-11 MED ORDER — METHYLPREDNISOLONE SODIUM SUCC 125 MG IJ SOLR: 60.0000 mg | Freq: Three times a day (TID) | INTRAMUSCULAR | Status: DC

## 2018-09-11 MED ORDER — LEVALBUTEROL HCL 0.63 MG/3ML IN NEBU
0.6300 mg | INHALATION_SOLUTION | Freq: Four times a day (QID) | RESPIRATORY_TRACT | Status: DC
  Administered 2018-09-11 (×2): 0.63 mg via RESPIRATORY_TRACT
  Filled 2018-09-11 (×2): qty 3

## 2018-09-11 MED ORDER — DOXYCYCLINE HYCLATE 100 MG PO TABS
100.0000 mg | ORAL_TABLET | Freq: Two times a day (BID) | ORAL | Status: DC
  Administered 2018-09-11 – 2018-09-13 (×5): 100 mg via ORAL
  Filled 2018-09-11 (×5): qty 1

## 2018-09-11 MED ORDER — INSULIN ASPART 100 UNIT/ML ~~LOC~~ SOLN
0.0000 [IU] | Freq: Every day | SUBCUTANEOUS | Status: DC
  Administered 2018-09-11: 2 [IU] via SUBCUTANEOUS
  Administered 2018-09-12: 4 [IU] via SUBCUTANEOUS

## 2018-09-11 NOTE — ED Notes (Signed)
  Attempted to call report.  RN will call me back. 

## 2018-09-11 NOTE — H&P (Signed)
History and Physical    Angel [name]:[id number] DOB: [birth year] DOA: 09/10/2018  PCP: Jackie Plum, MD Patient coming from: Home  Chief Complaint: Shortness of breath, cough  HPI: Angel [name] is a 83 y.o. male with medical history significant of COPD, left hemidiaphragm paralysis, CAD status post PCI, type 2 diabetes, hypertension, hyperlipidemia, BPH presenting to the hospital for evaluation of shortness of breath and cough.  History provided by patient and daughter at bedside.  Reports fatigue, dyspnea, wheezing, and cough productive of white-colored sputum for the past 2 days.  He only uses albuterol as needed at home for his COPD.  He is not on home oxygen.  Patient denies fevers, chills, rhinorrhea, sore throat, or sneezing.  Currently takes Lasix 20 mg daily for chronic swelling in his legs.  Denies any orthopnea or lower extremity edema at present.  Denies having any chest pain.  Denies any history of blood clots.  Review of Systems: As per HPI otherwise 10 point review of systems negative.  Past Medical History:  Diagnosis Date  . Arthritis   . Benign prostatic hypertrophy   . BPH (benign prostatic hyperplasia)   . COPD (chronic obstructive pulmonary disease) (HCC)   . Coronary artery disease    status post PTCA and stenting of the left anterior descending artery  . Diabetes mellitus   . Dyspnea    with exertion   . Hyperlipidemia   . Hypertension   . Paralyzed hemidiaphragm    Left  . Pneumonia    50 years ago    Past Surgical History:  Procedure Laterality Date  . THULIUM LASER TURP (TRANSURETHRAL RESECTION OF PROSTATE) N/A 10/30/2016   Procedure: THULIUM LASER TURP (TRANSURETHRAL RESECTION OF PROSTATE);  Surgeon: Bjorn Pippin, MD;  Location: [facility];  Service: Urology;  Laterality: N/A;  . TRANSURETHRAL RESECTION OF PROSTATE N/A 05/05/2017   Procedure: TRANSURETHRAL RESECTION OF THE PROSTATE (TURP);  Surgeon: Bjorn Pippin, MD;  Location: [facility];  Service:  Urology;  Laterality: N/A;  NEEDS 60 MIN FOR PROCEDURE     reports that he has never smoked. He has never used smokeless tobacco. He reports that he does not drink alcohol or use drugs.  No Known Allergies  Family History  Problem Relation Age of Onset  . Cancer Father   . Cancer Brother     Prior to Admission medications   Medication Sig Start Date End Date Taking? Authorizing Provider  acetaminophen (TYLENOL) 500 MG tablet Take 1,000 mg by mouth 2 (two) times daily.   Yes [provider]  albuterol (PROVENTIL HFA;VENTOLIN HFA) 108 (90 Base) MCG/ACT inhaler Inhale 1-2 puffs into the lungs every 4 (four) hours as needed for wheezing or shortness of breath.   Yes [provider]  aspirin 81 MG tablet Take 81 mg by mouth at bedtime.    Yes [provider]  clopidogrel (PLAVIX) 75 MG tablet TAKE 1 TABLET(75 MG) BY MOUTH AT BEDTIME Patient taking differently: Take 75 mg by mouth at bedtime.  08/13/18  Yes Nahser, Deloris Ping, MD  furosemide (LASIX) 20 MG tablet Take 20 mg by mouth at bedtime.   Yes [provider]  glipiZIDE-metformin (METAGLIP) 5-500 MG tablet Take 2 tablets by mouth 2 (two) times daily before a meal.    Yes [provider]  Methylcobalamin (B-12) 5000 MCG TBDP Take 5,000 mcg by mouth at bedtime.   Yes [provider]  nitroGLYCERIN (NITROSTAT) 0.4 MG SL tablet Place 0.4 mg under the  tongue every 5 (five) minutes as needed for chest pain.   Yes [provider]  Omega-3 Fatty Acids (FISH OIL) 1200 MG CAPS Take 1,200 mg by mouth at bedtime.   Yes [provider]  ramipril (ALTACE) 2.5 MG capsule Take 2.5 mg by mouth at bedtime.   Yes [provider]  rosuvastatin (CRESTOR) 20 MG tablet Take 20 mg by mouth at bedtime. 08/12/18  Yes [provider]  atorvastatin (LIPITOR) 40 MG tablet TAKE 1 TABLET BY MOUTH EVERY DAY Patient not taking: No sig reported 02/11/18   Vesta Mixer, MD     Physical Exam: Vitals:   09/11/18 0245 09/11/18 0315 09/11/18 0404 09/11/18 0427  BP: (!) 105/53 (!) 123/56 (!) 126/59   Pulse: 80 77 81 68  Resp:  (!) 22  (!) 21  Temp:   97.8 F (36.6 C)   TempSrc:   Axillary   SpO2: 95% 99% 99% 98%  Weight:   65.1 kg   Height:   5\' 5"  (1.651 m)     Physical Exam  Constitutional: He is oriented to person, place, and time. He appears well-developed and well-nourished.  HENT:  Head: Normocephalic.  Mouth/Throat: Oropharynx is clear and moist.  Eyes: Right eye exhibits no discharge. Left eye exhibits no discharge.  Neck: Neck supple. JVD present.  Cardiovascular: Normal rate, regular rhythm and intact distal pulses.  Pulmonary/Chest: He is in respiratory distress. He has wheezes. He has no rales.  Decreased air entry bilaterally Wheezing Diminished breath sounds at left lung base On supplemental oxygen  Abdominal: Soft. Bowel sounds are normal. He exhibits no distension. There is no abdominal tenderness. There is no guarding.  Musculoskeletal:        General: No edema.  Neurological: He is alert and oriented to person, place, and time.  Skin: Skin is warm and dry.     Labs on Admission: I have personally reviewed following labs and imaging studies  CBC: Recent Labs  Lab 09/10/18 2040 09/10/18 2217  WBC 9.5  --   NEUTROABS 7.8*  --   HGB 10.8* 11.2*  HCT 36.2* 33.0*  MCV 89.6  --   PLT 198  --    Basic Metabolic Panel: Recent Labs  Lab 09/10/18 2040 09/10/18 2217 09/11/18 0331  NA 133* 132* 135  K 3.2* 3.1* 3.3*  CL 91*  --  91*  CO2 26  --  29  GLUCOSE 192*  --  356*  BUN 23  --  24*  CREATININE 1.28*  --  1.41*  CALCIUM 8.2*  --  8.4*  MG  --   --  1.6*   GFR: Estimated Creatinine Clearance: 36.3 mL/min (A) (by C-G formula based on SCr of 1.41 mg/dL (H)). Liver Function Tests: Recent Labs  Lab 09/10/18 2040  AST 19  ALT 15  ALKPHOS 78  BILITOT 0.8  PROT 6.3*  ALBUMIN 3.6   No results for input(s):  LIPASE, AMYLASE in the last 168 hours. No results for input(s): AMMONIA in the last 168 hours. Coagulation Profile: No results for input(s): INR, PROTIME in the last 168 hours. Cardiac Enzymes: No results for input(s): CKTOTAL, CKMB, CKMBINDEX, TROPONINI in the last 168 hours. BNP (last 3 results) No results for input(s): PROBNP in the last 8760 hours. HbA1C: Recent Labs    09/11/18 0331  HGBA1C 7.0*   CBG: No results for input(s): GLUCAP in the last 168 hours. Lipid Profile: No results for input(s): CHOL, HDL, LDLCALC, TRIG,  CHOLHDL, LDLDIRECT in the last 72 hours. Thyroid Function Tests: No results for input(s): TSH, T4TOTAL, FREET4, T3FREE, THYROIDAB in the last 72 hours. Anemia Panel: No results for input(s): VITAMINB12, FOLATE, FERRITIN, TIBC, IRON, RETICCTPCT in the last 72 hours. Urine analysis:    Component Value Date/Time   COLORURINE RED (A) 05/30/2017 2029   APPEARANCEUR TURBID (A) 05/30/2017 2029   LABSPEC 1.020 05/30/2017 2029   PHURINE 7.5 05/30/2017 2029   GLUCOSEU 250 (A) 05/30/2017 2029   HGBUR LARGE (A) 05/30/2017 2029   BILIRUBINUR NEGATIVE 05/30/2017 2029   KETONESUR NEGATIVE 05/30/2017 2029   PROTEINUR >300 (A) 05/30/2017 2029   NITRITE NEGATIVE 05/30/2017 2029   LEUKOCYTESUR NEGATIVE 05/30/2017 2029    Radiological Exams on Admission: Dg Chest Port 1 View  Result Date: 09/10/2018 CLINICAL DATA:  Cough and hypoxia EXAM: PORTABLE CHEST 1 VIEW COMPARISON:  05/01/2017 FINDINGS: Chronic elevation of left diaphragm. Enlarged cardiomediastinal silhouette with vascular congestion and diffuse interstitial prominence suggesting edema. Probable trace pleural effusions. IMPRESSION: 1. Enlarged cardiomediastinal silhouette with vascular congestion and diffuse interstitial opacity suspect for edema. Probable trace pleural effusion 2. Chronic elevation of left diaphragm Electronically Signed   By: Jasmine Pang M.D.   On: 09/10/2018 20:26    EKG: Independently  reviewed.  Sinus tachycardia (heart rate 105).  Rate increased since prior tracing.  Assessment/Plan Principal Problem:   COPD with acute exacerbation (HCC) Active Problems:   Diabetes mellitus (HCC)   Hyperlipidemia   Acute respiratory failure with hypoxia (HCC)   Acute urinary retention   Acute exacerbation of CHF (congestive heart failure) (HCC)   Hypokalemia   Acute hypoxic respiratory failure secondary to acute COPD exacerbation and acute exacerbation of chronic diastolic congestive heart failure -BNP not impressive.  However, noted to have JVD on exam.  Chest x-ray showing enlarged cardiomediastinal silhouette with vascular congestion and diffuse interstitial opacity suspected to be edema.  Probable trace pleural effusion.  Echo done January 2018 showing normal systolic function and grade 2 diastolic dysfunction. -SPO2 in the 60s on arrival.  Placed on nonrebreather.  ABG showing pH 7.37, PCO2 56, PO2 346.  Patient was subsequently transitioned to 4 L supplemental oxygen via nasal cannula in the ED.  When I assessed the patient, oxygen saturation in the 70s on 4 L supplemental oxygen.  Continued to be hypoxic even on 10 L supplemental oxygen.  Noted to be wheezing with decreased air entry bilaterally.  Stat continuous albuterol nebulizer treatment given.  He was placed on BiPAP.  Upon reassessment, patient appeared more comfortable on BiPAP and SPO2 in the 90s. -Continue BiPAP at this time.  Repeat ABG. -Levalbuterol-ipratropium every 6 hours -Received Solu-Medrol 125 mg and magnesium 1 g in the ED.  Continue Solu-Medrol 60 mg every 8 hours starting early morning. -Pulmicort nebs twice daily -IV Lasix 40 mg every 12 hours -Echocardiogram -Monitor intake and output, daily weights -Influenza panel pending  ?Sepsis -Patient was started on ceftriaxone and azithromycin in the ED for coverage of presumed sepsis from pneumonia.  I think pneumonia is less likely given no fever and  leukocytosis.  Lactic acid normal.  Tachycardia and tachypnea can be explained by respiratory distress from bronchospasm.  Patient is not hypotensive.  Continue antibiotics at this time.  Check procalcitonin level.  Acute urinary retention, history of BPH -History of BPH but currently not on any medications.  Underwent TURP in October 2018.  Dutasteride and tamsulosin were discontinued at that time. -Per nursing staff, patient reported not urinating  for the past 24 hours.  Bladder scan showing 808 cc. -In and out cath for bladder decompression.  May need Foley if patient does not void in the next few hours. -Bladder scan every 4 hours -Start tamsulosin 0.4 mg daily -Consult urology in the morning.  Hypokalemia -Potassium 3.2.  Magnesium level 1.6. -Replete potassium and magnesium -Continue to monitor electrolytes  Chronic anemia -Stable.  Hemoglobin 10.8, at baseline.  CKD 3 -Creatinine 1.2, stable.  Type 2 diabetes -A1c 7.0. -Sliding scale insulin and CBG checks.  Hyperlipidemia -Continue statin  CAD status post PCI -Stable.  No anginal symptoms.  Continue aspirin and Plavix.  DVT prophylaxis: Lovenox Code Status: Patient wishes to be full code. Family Communication: Daughter at bedside. Disposition Plan: Anticipate discharge after clinical improvement. Consults called: None Admission status: It is my clinical opinion that admission to INPATIENT is reasonable and necessary in this 83 y.o. male . presenting with symptoms of shortness of breath, cough, wheezing, concerning for acute hypoxic respiratory failure secondary to acute COPD exacerbation and acute exacerbation of chronic diastolic congestive heart failure.  Also has acute urinary retention. . in the context of PMH including: COPD, chronic diastolic congestive heart failure, BPH . with pertinent positives on physical exam including: Wheezing, hypoxia, respiratory distress . and pertinent positives on radiographic and  laboratory data including: Chest x-ray with evidence of pulmonary vascular congestion. . Workup and treatment include IV steroid, nebulizer treatments, IV antibiotics, IV diuresis.  May need Foley for bladder decompression.  Given the aforementioned, the predictability of an adverse outcome is felt to be significant. I expect that the patient will require at least 2 midnights in the hospital to treat this condition.    John Giovanni MD [facility] Pager [phone]  If 7PM-7AM, please contact night-coverage [email or web address] Password [facility]  09/11/2018, 5:41 AM

## 2018-09-11 NOTE — Progress Notes (Signed)
Bladder scan showed of urine in the bladder. Pt has no urge to urinate and does not have any pain or pressure in the bladder area. Suggested an I&O cath and patient refused stating that he got an infection the last time a catheter was inserted. Pt daughter stated that pt does have prostate issues. Will continue to monitor.  Tera Helper E

## 2018-09-11 NOTE — Progress Notes (Signed)
PROGRESS NOTE   Angel [name]  WUJ:[id number]    DOB: Jun 08, 1936    DOA: 09/10/2018  PCP: Jackie Plum, MD   I have briefly reviewed patients previous medical records in [facility].  Brief Narrative:  83 year old male, lives with his daughter, independent, PMH of COPD not on home oxygen, left hemidiaphragm paralysis, CAD status post PCI to LAD, DM 2, HLD, HTN, BPH, chronic leg swelling on Lasix, presented to [facility] ED on 09/10/2018 with complaints of sore throat, chest congestion, mostly dry cough, progressive dyspnea and wheezing, seen initially at [facility] urgent care where he was found to be tachypneic, hypoxic in the 60s and was sent to ED.  He required BiPAP temporarily.  Admitted for suspected COPD exacerbation and possible decompensated CHF.  Improving.   Assessment & Plan:   Principal Problem:   COPD with acute exacerbation (HCC) Active Problems:   Diabetes mellitus (HCC)   Hyperlipidemia   Acute respiratory failure with hypoxia (HCC)   Acute urinary retention   Acute exacerbation of CHF (congestive heart failure) (HCC)   Hypokalemia   Acute respiratory failure with hypoxia: Most likely due to COPD exacerbation +/- decompensated CHF complicating underlying chronic left hemidiaphragm paralysis.  Clinically not in keeping with pneumonia.  Chest x-ray suggests cardiomegaly and possible pulmonary edema.  BNP 191.  Procalcitonin 0.18.  ABG: pH 7.373, PCO2 56, PO2 75, bicarbonate 32 and oxygen saturation 95.1, done approximately 8 hours after 1 on admission without much change in pH or PCO2, possibly chronic CO2 retainer.  On BiPAP overnight of admission. Improved and changed to [facility] (patient chronically mouth breather).  PRN BiPAP.  COPD exacerbation: Non-smoker.  Continue IV Solu-Medrol but reduce dose to 60 mg every 12 hours, Pulmicort, bronchodilator nebs and added flutter valve.  Change IV ceftriaxone and azithromycin to oral doxycycline for 5 days total.  This was probably the major  driver of his respiratory failure.  Acute on chronic diastolic CHF: Follow repeat TTE that has been ordered.  Started on IV Lasix 40 mg every 12 hourly, continue for today and then consider transitioning to oral Lasix in a.m.  Strict intake output charting.  Not much peripheral edema this morning.  SIRS: Likely related to acute respiratory failure from COPD exacerbation and CHF rather than infectious etiology.  No fever or leukocytosis.  No productive cough.  Had been empirically started on IV ceftriaxone and azithromycin, now discontinued.  History of BPH/acute urinary retention: Has had TURP by Dr. Annabell Howells, urology October 2018.  Not on meds PTA.  Now on Flomax.Overnight had bladder scan with 808 mL, patient declined in and out cath, urinated spontaneously.  Monitor closely and bladder scan PRN.  Hypokalemia: Replace and follow.  Hypomagnesemia: Replace and follow.  Normocytic anemia: Possibly chronic disease.  Stable.  Uncontrolled type II DM with hyperglycemia: A1c 7.  Now worsened by steroids.  Change SSI to moderate scale including bedtime.  Adjust insulin since needed.  Hyperlipidemia: Continue rosuvastatin.  CAD status post PCI: Remains on aspirin and Plavix, statins.  Essential hypertension: Controlled.  Acute on stage II chronic kidney disease: Baseline creatinine possibly in the 1.1 range.  Presented with creatinine of 1.28 which has increased to 1.41, may be related to acute urinary retention or diuresis.  Follow BMP closely in a.m.    DVT prophylaxis: Lovenox Code Status: Full Family Communication: None at bedside Disposition: DC home pending clinical improvement.   Consultants:  None  Procedures:  BiPAP Antimicrobials:  IV ceftriaxone and azithromycin-discontinued  Oral doxycycline 2/22 >   Subjective: Patient interviewed and examined this morning along with RN and RT in room.  Reports that he lives with his daughter, independent, not on home oxygen, CPAP or  BiPAP.  Non-smoker.  Denies sick contacts with URI symptoms.  Reports feeling much better, dyspnea improved and breathing may be almost back to baseline.  Cough, mostly nonproductive.  No chest pain.  ROS: As above, otherwise negative  Objective:  Vitals:   09/11/18 0743 09/11/18 0746 09/11/18 0753 09/11/18 0805  BP:   108/60   Pulse:   67   Resp:   17   Temp:   (!) 97.4 F (36.3 C)   TempSrc:   Axillary   SpO2: 97% 98% 98% 93%  Weight:      Height:        Examination:  General exam: Pleasant elderly male, moderately built and nourished lying comfortably propped up in bed without distress. Respiratory system: Slightly harsh breath sounds bilaterally with scattered few expiratory rhonchi, occasional basal crackles but otherwise clear to auscultation. Respiratory effort normal. Cardiovascular system: S1 & S2 heard, RRR. No JVD, murmurs, rubs, gallops or clicks. No pedal edema.  No presacral edema.  Telemetry personally reviewed: Sinus rhythm with BBB morphology. Gastrointestinal system: Abdomen is nondistended, soft and nontender. No organomegaly or masses felt. Normal bowel sounds heard. Central nervous system: Alert and oriented. No focal neurological deficits. Extremities: Symmetric 5 x 5 power. Skin: No rashes, lesions or ulcers Psychiatry: Judgement and insight appear normal. Mood & affect appropriate.     Data Reviewed: I have personally reviewed following labs and imaging studies  CBC: Recent Labs  Lab 09/10/18 2040 09/10/18 2217  WBC 9.5  --   NEUTROABS 7.8*  --   HGB 10.8* 11.2*  HCT 36.2* 33.0*  MCV 89.6  --   PLT 198  --    Basic Metabolic Panel: Recent Labs  Lab 09/10/18 2040 09/10/18 2217 09/11/18 0331  NA 133* 132* 135  K 3.2* 3.1* 3.3*  CL 91*  --  91*  CO2 26  --  29  GLUCOSE 192*  --  356*  BUN 23  --  24*  CREATININE 1.28*  --  1.41*  CALCIUM 8.2*  --  8.4*  MG  --   --  1.6*   Liver Function Tests: Recent Labs  Lab 09/10/18 2040  AST  19  ALT 15  ALKPHOS 78  BILITOT 0.8  PROT 6.3*  ALBUMIN 3.6   HbA1C: Recent Labs    09/11/18 0331  HGBA1C 7.0*   CBG: Recent Labs  Lab 09/11/18 0750  GLUCAP 298*    No results found for this or any previous visit (from the past 240 hour(s)).       Radiology Studies: Dg Chest Port 1 View  Result Date: 09/10/2018 CLINICAL DATA:  Cough and hypoxia EXAM: PORTABLE CHEST 1 VIEW COMPARISON:  05/01/2017 FINDINGS: Chronic elevation of left diaphragm. Enlarged cardiomediastinal silhouette with vascular congestion and diffuse interstitial prominence suggesting edema. Probable trace pleural effusions. IMPRESSION: 1. Enlarged cardiomediastinal silhouette with vascular congestion and diffuse interstitial opacity suspect for edema. Probable trace pleural effusion 2. Chronic elevation of left diaphragm Electronically Signed   By: Jasmine Pang M.D.   On: 09/10/2018 20:26        Scheduled Meds: . aspirin  81 mg Oral QHS  . budesonide (PULMICORT) nebulizer solution  0.25 mg Nebulization BID  . clopidogrel  75 mg Oral QHS  . enoxaparin (  LOVENOX) injection  40 mg Subcutaneous Q24H  . furosemide  40 mg Intravenous Q12H  . insulin aspart  0-9 Units Subcutaneous TID WC  . ipratropium  0.5 mg Nebulization Q6H  . levalbuterol  0.63 mg Nebulization Q6H  . [START ON 09/12/2018] methylPREDNISolone (SOLU-MEDROL) injection  60 mg Intravenous Q8H  . rosuvastatin  20 mg Oral QHS  . tamsulosin  0.4 mg Oral Daily  . vitamin B-12  5,000 mcg Oral QHS   Continuous Infusions: . azithromycin Stopped (09/10/18 2326)  . cefTRIAXone (ROCEPHIN)  IV Stopped (09/10/18 2222)     LOS: 1 day     Marcellus Scott, MD, FACP, [facility]. [facility]  To contact the attending provider between 7A-7P or the covering provider during after hours 7P-7A, please log into the web site [email or web address] and access using universal [place] password for that web site. If you do not have the password, please call the  hospital operator.  09/11/2018, 11:27 AM

## 2018-09-11 NOTE — Progress Notes (Signed)
Pt informed RN that he had not urinated since 2/21 AM upon arrival to the unit. RN obtained bladder scan at 0430 of . RN then obtained consent for in and out cath. RN explained to pt the need for an in and out cath and pt refused stating that he has had "bad experiences in the past and doesn't want an infection." Pt had urinated 200 mL at that time. Post bladder scan volume . Q4 bladder scan order in place. Will continue to monitor.

## 2018-09-11 NOTE — Progress Notes (Signed)
  Echocardiogram 2D Echocardiogram has been performed.  Leta Jungling M 09/11/2018, 3:00 PM

## 2018-09-12 ENCOUNTER — Inpatient Hospital Stay (HOSPITAL_COMMUNITY): Payer: BLUE CROSS/BLUE SHIELD

## 2018-09-12 DIAGNOSIS — R338 Other retention of urine: Secondary | ICD-10-CM

## 2018-09-12 DIAGNOSIS — J441 Chronic obstructive pulmonary disease with (acute) exacerbation: Principal | ICD-10-CM

## 2018-09-12 DIAGNOSIS — I5033 Acute on chronic diastolic (congestive) heart failure: Secondary | ICD-10-CM

## 2018-09-12 DIAGNOSIS — J9601 Acute respiratory failure with hypoxia: Secondary | ICD-10-CM

## 2018-09-12 LAB — BLOOD CULTURE ID PANEL (REFLEXED)

## 2018-09-12 LAB — BASIC METABOLIC PANEL
Anion gap: 13 (ref 5–15)
BUN: 39 mg/dL — ABNORMAL HIGH (ref 8–23)
CO2: 32 mmol/L (ref 22–32)
Calcium: 9.1 mg/dL (ref 8.9–10.3)
Chloride: 91 mmol/L — ABNORMAL LOW (ref 98–111)
Creatinine, Ser: 1.43 mg/dL — ABNORMAL HIGH (ref 0.61–1.24)
GFR calc Af Amer: 53 mL/min — ABNORMAL LOW (ref >60)
GFR calc non Af Amer: 46 mL/min — ABNORMAL LOW (ref >60)
Glucose, Bld: 228 mg/dL — ABNORMAL HIGH (ref 70–99)
Potassium: 3.7 mmol/L (ref 3.5–5.1)
Sodium: 136 mmol/L (ref 135–145)

## 2018-09-12 LAB — GLUCOSE, CAPILLARY
Glucose-Capillary: 211 mg/dL — ABNORMAL HIGH (ref 70–99)
Glucose-Capillary: 336 mg/dL — ABNORMAL HIGH (ref 70–99)
Glucose-Capillary: 270 mg/dL — ABNORMAL HIGH (ref 70–99)
Glucose-Capillary: 324 mg/dL — ABNORMAL HIGH (ref 70–99)

## 2018-09-12 LAB — MAGNESIUM: Magnesium: 1.8 mg/dL (ref 1.7–2.4)

## 2018-09-12 MED ORDER — ALBUTEROL SULFATE (2.5 MG/3ML) 0.083% IN NEBU: 2.5000 mg | INHALATION_SOLUTION | Freq: Four times a day (QID) | RESPIRATORY_TRACT | Status: DC | PRN

## 2018-09-12 MED ORDER — PREDNISONE 20 MG PO TABS
40.0000 mg | ORAL_TABLET | Freq: Every day | ORAL | Status: DC
  Administered 2018-09-13: 40 mg via ORAL
  Filled 2018-09-12: qty 2

## 2018-09-12 NOTE — Progress Notes (Signed)
PROGRESS NOTE   Angel [name]  ZOX:[id number]    DOB: 07-24-1935    DOA: 09/10/2018  PCP: Jackie Plum, MD   I have briefly reviewed patients previous medical records in [facility].  Brief Narrative:  83 year old male, lives with his daughter, independent, PMH of COPD not on home oxygen, left hemidiaphragm paralysis, CAD status post PCI to LAD, DM 2, HLD, HTN, BPH, chronic leg swelling on Lasix, presented to [facility] ED on 09/10/2018 with complaints of sore throat, chest congestion, mostly dry cough, progressive dyspnea and wheezing, seen initially at [facility] urgent care where he was found to be tachypneic, hypoxic in the 60s and was sent to ED.  He required BiPAP temporarily.  Admitted for suspected COPD exacerbation and possible decompensated CHF.  Improving.   Assessment & Plan:   Principal Problem:   COPD with acute exacerbation (HCC) Active Problems:   Diabetes mellitus (HCC)   Hyperlipidemia   Acute respiratory failure with hypoxia (HCC)   Acute urinary retention   Acute exacerbation of CHF (congestive heart failure) (HCC)   Hypokalemia   Acute respiratory failure with hypoxia: Most likely due to COPD exacerbation +/- decompensated CHF complicating underlying chronic left hemidiaphragm paralysis.  Clinically not in keeping with pneumonia.  Chest x-ray suggests cardiomegaly and possible pulmonary edema.  BNP 191.  Procalcitonin 0.18.  ABG: pH 7.373, PCO2 56, PO2 75, bicarbonate 32 and oxygen saturation 95.1, done approximately 8 hours after 1 on admission without much change in pH or PCO2, possibly chronic CO2 retainer.  On BiPAP overnight of admission. Improved and changed to [facility] (patient chronically mouth breather).  Did not require BiPAP overnight.  Due to mouth breathing, desaturates when placed on nasal cannula oxygen and hence is on Ventimask 35%.  Encouraged patient to breathe with mouth closed when awake and consider trial of nasal cannula oxygen and mouth while asleep.   Improved.  COPD exacerbation: Non-smoker.  Continue IV Solu-Medrol but reduce dose to 60 mg every 12 hours, Pulmicort, bronchodilator nebs and added flutter valve.  Change IV ceftriaxone and azithromycin to oral doxycycline for 5 days total.  This was probably the major driver of his respiratory failure.  Improved.  Change steroids to oral prednisone taper from tomorrow.  Acute on chronic diastolic CHF: Treated with IV Lasix 40 mg every 12 hours x2 doses.  Clinically euvolemic.  TTE 2/22: LVEF 55%.  Hold off on initiating oral diuretics.  SIRS: Likely related to acute respiratory failure from COPD exacerbation and CHF rather than infectious etiology.  No fever or leukocytosis.  No productive cough.  Had been empirically started on IV ceftriaxone and azithromycin, now discontinued.  1 of 4 blood cultures positive for gram-positive cocci in chains: Suspected contaminant.  Follow final cultures.  Continue current doxycycline without change.  History of BPH/acute urinary retention: Has had TURP by Dr. Annabell Howells, urology October 2018.  Not on meds PTA.  Patient having intermittent urinary retention with bladder scan showing 800 mL & 500 mL but declining in and out catheterization.  Monitor closely.  If he has persistent acute urinary retention, may need indwelling Foley catheter.  Hypokalemia: Replaced  Hypomagnesemia: Replaced  Normocytic anemia: Possibly chronic disease.  Stable.  Uncontrolled type II DM with hyperglycemia: A1c 7.  Now worsened by steroids.  Change SSI to moderate scale including bedtime.  Adjust insulin since needed.  Tapering down steroids will help.  Hyperlipidemia: Continue rosuvastatin.  CAD status post PCI: Remains on aspirin and Plavix, statins.  Essential  hypertension: Controlled.  Acute on stage II chronic kidney disease: Baseline creatinine possibly in the 1.1 range.  Presented with creatinine of 1.28 which has increased to 1.41, may be related to acute urinary retention  or diuresis.  Creatinine stable in the 1.4 range.  Check renal ultrasound to rule out obstruction from urinary retention.    DVT prophylaxis: Lovenox Code Status: Full Family Communication: I discussed with patient's daughter, updated care and answered questions. Disposition: DC home pending clinical improvement, hopefully 2/24.   Consultants:  None  Procedures:  BiPAP Antimicrobials:  IV ceftriaxone and azithromycin-discontinued Oral doxycycline 2/22 >   Subjective: Dyspnea significantly improved.  Breathing almost but not yet at baseline.  No cough or pain reported.  States that he mouth breathes as a "habit".  ROS: As above, otherwise negative  Objective:  Vitals:   09/12/18 0745 09/12/18 0748 09/12/18 0753 09/12/18 1101  BP:   112/71 (!) 115/50  Pulse:   78 84  Resp:   (!) 24 19  Temp:   (!) 97.2 F (36.2 C) 98 F (36.7 C)  TempSrc:   Oral Oral  SpO2: 98% 94% 99% 94%  Weight:      Height:        Examination:  General exam: Pleasant elderly male, moderately built and nourished lying comfortably supine in bed without distress. Respiratory system: Much improved breath sounds compared to yesterday.  Essentially clear to auscultation with occasional anterior rhonchi.  No increased work of breathing.  Able to speak in full sentences. Cardiovascular system: S1 & S2 heard, RRR. No JVD, murmurs, rubs, gallops or clicks. No pedal edema.  No presacral edema.  Telemetry personally reviewed: Sinus rhythm. Gastrointestinal system: Abdomen is nondistended, soft and nontender. No organomegaly or masses felt. Normal bowel sounds heard.  Stable Central nervous system: Alert and oriented. No focal neurological deficits.  Stable Extremities: Symmetric 5 x 5 power. Skin: No rashes, lesions or ulcers Psychiatry: Judgement and insight appear normal. Mood & affect appropriate.     Data Reviewed: I have personally reviewed following labs and imaging studies  CBC: Recent Labs  Lab  09/10/18 2040 09/10/18 2217  WBC 9.5  --   NEUTROABS 7.8*  --   HGB 10.8* 11.2*  HCT 36.2* 33.0*  MCV 89.6  --   PLT 198  --    Basic Metabolic Panel: Recent Labs  Lab 09/10/18 2040 09/10/18 2217 09/11/18 0331 09/12/18 0431  NA 133* 132* 135 136  K 3.2* 3.1* 3.3* 3.7  CL 91*  --  91* 91*  CO2 26  --  29 32  GLUCOSE 192*  --  356* 228*  BUN 23  --  24* 39*  CREATININE 1.28*  --  1.41* 1.43*  CALCIUM 8.2*  --  8.4* 9.1  MG  --   --  1.6* 1.8   Liver Function Tests: Recent Labs  Lab 09/10/18 2040  AST 19  ALT 15  ALKPHOS 78  BILITOT 0.8  PROT 6.3*  ALBUMIN 3.6   HbA1C: Recent Labs    09/11/18 0331  HGBA1C 7.0*   CBG: Recent Labs  Lab 09/11/18 1127 09/11/18 1638 09/11/18 2104 09/12/18 0744 09/12/18 1059  GLUCAP 198* 275* 212* 211* 336*    Recent Results (from the past 240 hour(s))  Blood Culture (routine x 2)     Status: None (Preliminary result)   Collection Time: 09/10/18  8:36 PM  Result Value Ref Range Status   Specimen Description BLOOD RIGHT FOREARM  Final  Special Requests   Final    BOTTLES DRAWN AEROBIC AND ANAEROBIC Blood Culture adequate volume Performed at [facility], [address], [place], [place] [address]    Culture NO GROWTH 2 DAYS  Final   Report Status PENDING  Incomplete  Blood Culture (routine x 2)     Status: None (Preliminary result)   Collection Time: 09/10/18  8:55 PM  Result Value Ref Range Status   Specimen Description BLOOD LEFT ANTECUBITAL  Final   Special Requests   Final    BOTTLES DRAWN AEROBIC AND ANAEROBIC Blood Culture adequate volume   Culture  Setup Time   Final    GRAM POSITIVE COCCI IN CHAINS ANAEROBIC BOTTLE ONLY CRITICAL RESULT CALLED TO, READ BACK BY AND VERIFIED WITH: E. DEJA, PHARMD AT 1015 ON 09/12/18 BY C. JESSUP, MLT. Performed at [facility], [address], [place], [place] [address]    Culture GRAM POSITIVE COCCI  Final   Report Status PENDING  Incomplete  Blood Culture ID  Panel (Reflexed)     Status: None   Collection Time: 09/10/18  8:55 PM  Result Value Ref Range Status   Enterococcus species NOT DETECTED NOT DETECTED Final   Listeria monocytogenes NOT DETECTED NOT DETECTED Final   Staphylococcus species NOT DETECTED NOT DETECTED Final   Staphylococcus aureus (BCID) NOT DETECTED NOT DETECTED Final   Streptococcus species NOT DETECTED NOT DETECTED Final   Streptococcus agalactiae NOT DETECTED NOT DETECTED Final   Streptococcus pneumoniae NOT DETECTED NOT DETECTED Final   Streptococcus pyogenes NOT DETECTED NOT DETECTED Final   Acinetobacter baumannii NOT DETECTED NOT DETECTED Final   Enterobacteriaceae species NOT DETECTED NOT DETECTED Final   Enterobacter cloacae complex NOT DETECTED NOT DETECTED Final   Escherichia coli NOT DETECTED NOT DETECTED Final   Klebsiella oxytoca NOT DETECTED NOT DETECTED Final   Klebsiella pneumoniae NOT DETECTED NOT DETECTED Final   Proteus species NOT DETECTED NOT DETECTED Final   Serratia marcescens NOT DETECTED NOT DETECTED Final   Haemophilus influenzae NOT DETECTED NOT DETECTED Final   Neisseria meningitidis NOT DETECTED NOT DETECTED Final   Pseudomonas aeruginosa NOT DETECTED NOT DETECTED Final   Candida albicans NOT DETECTED NOT DETECTED Final   Candida glabrata NOT DETECTED NOT DETECTED Final   Candida krusei NOT DETECTED NOT DETECTED Final   Candida parapsilosis NOT DETECTED NOT DETECTED Final   Candida tropicalis NOT DETECTED NOT DETECTED Final    Comment: Performed at [facility], [address], [place], [place] [address]         Radiology Studies: Dg Chest Port 1 View  Result Date: 09/10/2018 CLINICAL DATA:  Cough and hypoxia EXAM: PORTABLE CHEST 1 VIEW COMPARISON:  05/01/2017 FINDINGS: Chronic elevation of left diaphragm. Enlarged cardiomediastinal silhouette with vascular congestion and diffuse interstitial prominence suggesting edema. Probable trace pleural effusions. IMPRESSION: 1. Enlarged  cardiomediastinal silhouette with vascular congestion and diffuse interstitial opacity suspect for edema. Probable trace pleural effusion 2. Chronic elevation of left diaphragm Electronically Signed   By: Jasmine Pang M.D.   On: 09/10/2018 20:26        Scheduled Meds: . aspirin  81 mg Oral QHS  . budesonide (PULMICORT) nebulizer solution  0.25 mg Nebulization BID  . clopidogrel  75 mg Oral QHS  . doxycycline  100 mg Oral Q12H  . enoxaparin (LOVENOX) injection  40 mg Subcutaneous Q24H  . insulin aspart  0-5 Units Subcutaneous QHS  . insulin aspart  0-9 Units Subcutaneous  TID WC  . ipratropium-albuterol  3 mL Nebulization Q6H  . methylPREDNISolone (SOLU-MEDROL) injection  60 mg Intravenous Q12H  . rosuvastatin  20 mg Oral QHS  . tamsulosin  0.4 mg Oral Daily  . vitamin B-12  5,000 mcg Oral QHS   Continuous Infusions:    LOS: 2 days     Marcellus Scott, MD, FACP, [facility]. [facility]  To contact the attending provider between 7A-7P or the covering provider during after hours 7P-7A, please log into the web site [email or web address] and access using universal [place] password for that web site. If you do not have the password, please call the hospital operator.  09/12/2018, 1:28 PM

## 2018-09-12 NOTE — Progress Notes (Signed)
PHARMACY - PHYSICIAN COMMUNICATION CRITICAL VALUE ALERT - BLOOD CULTURE IDENTIFICATION (BCID)  Angel [name] is an 83 y.o. male who presented to [facility] on 09/10/2018 with a chief complaint of SOB and cough, currently receiving treatment for acute COPD exacerbation, AoCHF, and possible pneumonia. Admission BCx now growing GPC in chains in 1/4 bottles, organism NOT identified on BCID. Patient is currently stable from an infectious standpoint - WBC WNL, AF, VSS WNL.  Name of physician (or Provider) Contacted: [place]  Current antibiotics: PO doxycycline  Changes to prescribed antibiotics recommended:  None - given stable condition, likely contaminant. Recommend continuing to monitor on current antibiotics and follow up final culture results.  Results for orders placed or performed during the hospital encounter of 09/10/18  Blood Culture ID Panel (Reflexed) (Collected: 09/10/2018  8:55 PM)  Result Value Ref Range   Enterococcus species NOT DETECTED NOT DETECTED   Listeria monocytogenes NOT DETECTED NOT DETECTED   Staphylococcus species NOT DETECTED NOT DETECTED   Staphylococcus aureus (BCID) NOT DETECTED NOT DETECTED   Streptococcus species NOT DETECTED NOT DETECTED   Streptococcus agalactiae NOT DETECTED NOT DETECTED   Streptococcus pneumoniae NOT DETECTED NOT DETECTED   Streptococcus pyogenes NOT DETECTED NOT DETECTED   Acinetobacter baumannii NOT DETECTED NOT DETECTED   Enterobacteriaceae species NOT DETECTED NOT DETECTED   Enterobacter cloacae complex NOT DETECTED NOT DETECTED   Escherichia coli NOT DETECTED NOT DETECTED   Klebsiella oxytoca NOT DETECTED NOT DETECTED   Klebsiella pneumoniae NOT DETECTED NOT DETECTED   Proteus species NOT DETECTED NOT DETECTED   Serratia marcescens NOT DETECTED NOT DETECTED   Haemophilus influenzae NOT DETECTED NOT DETECTED   Neisseria meningitidis NOT DETECTED NOT DETECTED   Pseudomonas aeruginosa NOT DETECTED NOT DETECTED   Candida albicans NOT  DETECTED NOT DETECTED   Candida glabrata NOT DETECTED NOT DETECTED   Candida krusei NOT DETECTED NOT DETECTED   Candida parapsilosis NOT DETECTED NOT DETECTED   Candida tropicalis NOT DETECTED NOT DETECTED   Erin N. Zigmund Daniel, PharmD, BCPS PGY2 Infectious Diseases Pharmacy Resident Phone: [phone] 09/12/2018  10:16 AM

## 2018-09-12 NOTE — Progress Notes (Signed)
Pt trialed on 4 lpm [place] per pt req.  Pt is a mouth breather, desats,  when he falls asleep.  Pt placed back on venturi mask for hs.

## 2018-09-12 NOTE — Progress Notes (Signed)
Pt found on 5 lpm [place], spo2 86-87%.  Neb tx given and pt placed on 6 lpm [place], spo2 93%.  Pt resting comfortably.  Will cont to monitor and assess.

## 2018-09-13 DIAGNOSIS — E876 Hypokalemia: Secondary | ICD-10-CM

## 2018-09-13 DIAGNOSIS — E7800 Pure hypercholesterolemia, unspecified: Secondary | ICD-10-CM

## 2018-09-13 LAB — BASIC METABOLIC PANEL
Anion gap: 8 (ref 5–15)
BUN: 41 mg/dL — ABNORMAL HIGH (ref 8–23)
CO2: 34 mmol/L — ABNORMAL HIGH (ref 22–32)
Calcium: 9.4 mg/dL (ref 8.9–10.3)
Chloride: 93 mmol/L — ABNORMAL LOW (ref 98–111)
Creatinine, Ser: 1.19 mg/dL (ref 0.61–1.24)
GFR calc Af Amer: >60 mL/min (ref >60)
GFR calc non Af Amer: 57 mL/min — ABNORMAL LOW (ref >60)
Glucose, Bld: 205 mg/dL — ABNORMAL HIGH (ref 70–99)
Potassium: 4.2 mmol/L (ref 3.5–5.1)
Sodium: 135 mmol/L (ref 135–145)

## 2018-09-13 LAB — GLUCOSE, CAPILLARY
Glucose-Capillary: 193 mg/dL — ABNORMAL HIGH (ref 70–99)
Glucose-Capillary: 293 mg/dL — ABNORMAL HIGH (ref 70–99)
Glucose-Capillary: 367 mg/dL — ABNORMAL HIGH (ref 70–99)

## 2018-09-13 MED ORDER — DOXYCYCLINE HYCLATE 100 MG PO TABS: 100.0000 mg | ORAL_TABLET | Freq: Two times a day (BID) | ORAL | 0 refills | Status: DC

## 2018-09-13 MED ORDER — TAMSULOSIN HCL 0.4 MG PO CAPS: 0.4000 mg | ORAL_CAPSULE | Freq: Every day | ORAL | 0 refills | Status: DC

## 2018-09-13 MED ORDER — PREDNISONE 10 MG PO TABS: ORAL_TABLET | ORAL | 0 refills | Status: DC

## 2018-09-13 NOTE — Evaluation (Signed)
Physical Therapy Evaluation Patient Details Name: Angel [name] MRN: [id number] DOB: 06/22/1936 Today's Date: 09/13/2018   History of Present Illness  Pt is an 83 y/o male admitted secondary to worsening SOB. Found to have acute respiratory failure secondary to COPD exacerbation. PMH includes COPD, CAD s/p PCI, DM, HTN, and CKD.   Clinical Impression  Pt admitted secondary to problem above with deficits below. Pt requiring min guard to supervision for mobility tasks using RW. Educated to use RW at home to increase safety. Pt's oxygen sats decreasing to 83% on RA during ambulation and required 3L to maintain sats between 88-91%. Pt reports daughter can check in on him at home and pt eager to return home. Will continue to follow acutely to maximize functional mobility independence and safety.     Follow Up Recommendations Home health PT;Supervision for mobility/OOB    Equipment Recommendations  None recommended by PT    Recommendations for Other Services       Precautions / Restrictions Precautions Precautions: Fall;Other (comment) Precaution Comments: watch O2 Restrictions Weight Bearing Restrictions: No      Mobility  Bed Mobility Overal bed mobility: Modified Independent                Transfers Overall transfer level: Needs assistance Equipment used: None Transfers: Sit to/from Stand Sit to Stand: Min guard         General transfer comment: Min guard for safety.   Ambulation/Gait Ambulation/Gait assistance: Supervision;Min guard Gait Distance (Feet): 100 Feet Assistive device: None;Rolling walker (2 wheeled) Gait Pattern/deviations: Step-through pattern;Decreased stride length Gait velocity: Decreased    General Gait Details: Attempted gait without AD in room, however, pt very slow and guarded. Gave RW and pt with improved confidence and steadiness. Required min guard to supervision for safety with use of RW. Educated to use RW at home to increase safety. Pt  oxygen sats decreasing to 83% on RA and required 3L to remain at 88-91%.   Stairs            Wheelchair Mobility    Modified Rankin (Stroke Patients Only)       Balance Overall balance assessment: Needs assistance Sitting-balance support: No upper extremity supported;Feet supported Sitting balance-Leahy Scale: Good     Standing balance support: Bilateral upper extremity supported;During functional activity Standing balance-Leahy Scale: Poor Standing balance comment: Reliant on BUE support                              Pertinent Vitals/Pain Pain Assessment: No/denies pain    Home Living Family/patient expects to be discharged to:: Private residence Living Arrangements: Alone Available Help at Discharge: Family;Available PRN/intermittently Type of Home: House Home Access: Level entry     Home Layout: One level Home Equipment: Walker - 2 wheels;Cane - single point      Prior Function Level of Independence: Independent with assistive device(s)         Comments: Reports using cane for community ambulation      Hand Dominance        Extremity/Trunk Assessment   Upper Extremity Assessment Upper Extremity Assessment: Overall WFL for tasks assessed    Lower Extremity Assessment Lower Extremity Assessment: Generalized weakness    Cervical / Trunk Assessment Cervical / Trunk Assessment: Normal  Communication   Communication: No difficulties  Cognition Arousal/Alertness: Awake/alert Behavior During Therapy: WFL for tasks assessed/performed Overall Cognitive Status: Within Functional Limits for tasks assessed  General Comments      Exercises     Assessment/Plan    PT Assessment Patient needs continued PT services  PT Problem List Cardiopulmonary status limiting activity;Decreased strength;Decreased activity tolerance;Decreased balance;Decreased mobility;Decreased knowledge of use of  DME;Decreased knowledge of precautions       PT Treatment Interventions Gait training;DME instruction;Functional mobility training;Therapeutic activities;Therapeutic exercise;Balance training;Patient/family education    PT Goals (Current goals can be found in the Care Plan section)  Acute Rehab PT Goals Patient Stated Goal: to go home  PT Goal Formulation: With patient Time For Goal Achievement: 09/27/18 Potential to Achieve Goals: Good    Frequency Min 3X/week   Barriers to discharge Decreased caregiver support      Co-evaluation               AM-PAC PT "6 Clicks" Mobility  Outcome Measure Help needed turning from your back to your side while in a flat bed without using bedrails?: None Help needed moving from lying on your back to sitting on the side of a flat bed without using bedrails?: None Help needed moving to and from a bed to a chair (including a wheelchair)?: A Little Help needed standing up from a chair using your arms (e.g., wheelchair or bedside chair)?: A Little Help needed to walk in hospital room?: A Little Help needed climbing 3-5 steps with a railing? : A Lot 6 Click Score: 19    End of Session Equipment Utilized During Treatment: Gait belt Activity Tolerance: Patient tolerated treatment well Patient left: in bed;with call bell/phone within reach;with bed alarm set Nurse Communication: Mobility status;Other (comment)(oxygen sats ) PT Visit Diagnosis: Other abnormalities of gait and mobility (R26.89);Muscle weakness (generalized) (M62.81)    Time: 9485-4627 PT Time Calculation (min) (ACUTE ONLY): 20 min   Charges:   PT Evaluation $PT Eval Low Complexity: 1 Low          Gladys Damme, PT, DPT  Acute Rehabilitation Services  Pager: [phone] Office: [phone]   Lehman Prom 09/13/2018, 1:12 PM

## 2018-09-13 NOTE — Progress Notes (Signed)
Pt getting OOB to [facility] and accidentally pulled IV NSL out. Pt requesting not to have it put back in due to possible DC this am. Will leave out for now and address with MD in am. Dierdre Highman, RN

## 2018-09-13 NOTE — Progress Notes (Signed)
SATURATION QUALIFICATIONS: (This note is used to comply with regulatory documentation for home oxygen)  Patient Saturations on Room Air at Rest = 90%  Patient Saturations on Room Air while Ambulating = 83%  Patient Saturations on 3 Liters of oxygen while Ambulating = 90%  Please briefly explain why patient needs home oxygen: Pt oxygen sats decreasing to 83% on RA during ambulation and required 3L of oxygen to maintain sats between 88-91%. Pt will require supplemental oxygen to maintain adequate oxygen saturations at home.   Gladys Damme, PT, DPT  Acute Rehabilitation Services  Pager: [phone] Office: [phone]

## 2018-09-13 NOTE — Care Management Note (Signed)
Case Management Note  Patient Details  Name: Angel [name] MRN: [id number] Date of Birth: [birth year]  Subjective/Objective:  Pt presented for COPD exacerbation. PTA from home with daughter Arline Asp. Patient was offered Medicare.[facility] list and patient declined services.                   Action/Plan: CM did ask patient if ok to speak with daughter Arline Asp. Patient declined HH Services- Arline Asp is aware- however, patient is agreeable to oxygen. DME ordered via [facility]- Referral made to [facility] with [facility] and DME will be delivered to room prior to transition home. No further needs from CM at this time.    Expected Discharge Date:  09/13/18               Expected Discharge Plan:  Home/Self Care  In-House Referral:  NA  Discharge planning Services  CM Consult  Post Acute Care Choice:  Durable Medical Equipment Choice offered to:  Patient, Adult Children  DME Arranged:  Oxygen DME Agency:  [facility]  HH Arranged:  Patient Refused [facility] HH Agency:  NA  Status of Service:  Completed, signed off  If discussed at Long Length of Stay Meetings, dates discussed:    Additional Comments:  Gala Lewandowsky, RN 09/13/2018, 1:51 PM

## 2018-09-13 NOTE — Discharge Instructions (Signed)

## 2018-09-13 NOTE — Discharge Summary (Addendum)
Physician Discharge Summary  NYHEEM BINETTE ZOX:[id number] DOB: September 18, 1935  PCP: Jackie Plum, MD  Admit date: 09/10/2018 Discharge date: 09/13/2018  Recommendations for Outpatient Follow-up:  1. Dr. Jackie Plum, PCP in 5 days with repeat labs (CBC & BMP). 2. Dr. Bjorn Pippin, Urology in 1 week 3. Dr. Kristeen Miss, Cardiology in 2 weeks.  Home Health: PT Equipment/Devices: Home oxygen via nasal cannula at 3 L/min continuously.  Discharge Condition: Improved and stable CODE STATUS: Full Diet recommendation: Heart healthy diet.  Discharge Diagnoses:  Principal Problem:   COPD with acute exacerbation (HCC) Active Problems:   Diabetes mellitus (HCC)   Hyperlipidemia   Acute respiratory failure with hypoxia (HCC)   Acute urinary retention   Acute exacerbation of CHF (congestive heart failure) (HCC)   Hypokalemia   Brief Summary: 83 year old male, lives with his daughter, independent, PMH of COPD not on home oxygen, left hemidiaphragm paralysis, CAD status post PCI to LAD, DM 2, HLD, HTN, BPH, chronic diastolic CHF on Lasix, presented to [facility] ED on 09/10/2018 with complaints of sore throat, chest congestion, mostly dry cough, progressive dyspnea and wheezing, seen initially at [facility] urgent care where he was found to be tachypneic, hypoxic in the 60s and was sent to ED.  He required BiPAP temporarily.  Admitted for suspected COPD exacerbation and possible decompensated CHF.    Assessment & Plan:   Acute respiratory failure with hypoxia: Most likely due to COPD exacerbation +/- decompensated CHF complicating underlying chronic left hemidiaphragm paralysis.  Clinically not in keeping with pneumonia.  Chest x-ray suggests cardiomegaly and possible pulmonary edema.  BNP 191.  Procalcitonin 0.18.  ABG: pH 7.373, PCO2 56, PO2 75, bicarbonate 32 and oxygen saturation 95.1, done approximately 8 hours after 1 on admission without much change in pH or PCO2, possibly chronic CO2 retainer.  On  BiPAP overnight of admission.  Has not required BiPAP since night of admission.  Patient is a chronic mouth breather and initially it was difficult to keep his oxygenation up with nasal cannula oxygen and even though he was not in any distress, he had to be placed on nonrebreather mask on Ventimask.  Since then however he has improved, tolerating nasal cannula oxygen.  Patient will be discharged home to complete steroid taper, complete course of prednisone, PRN albuterol inhaler and qualifies for home oxygen.  COPD exacerbation: Non-smoker. Treated with IV Solu-Medrol, Pulmicort, bronchodilator nebs and added flutter valve.  Changed IV ceftriaxone and azithromycin to oral doxycycline for 5 days total. COPD exacerbation was probably the major driver of his respiratory failure.   Improved and stable.   Management as indicated above.  Acute on chronic diastolic CHF: Treated with IV Lasix 40 mg every 12 hours x2 doses.  Clinically euvolemic.  TTE 2/22: LVEF 55%.    Resume prior home dose of diuretics at discharge/20 mg at bedtime.  Reassess during close outpatient follow-up to see if dose need to be uptitrated.  SIRS: Likely related to acute respiratory failure from COPD exacerbation and CHF rather than infectious etiology.  No fever or leukocytosis.  No productive cough.  Had been empirically started on IV ceftriaxone and azithromycin, now discontinued.  1 of 4 blood cultures positive for gram-positive cocci in chains: Suspected contaminant.  Follow final cultures.  Continue current doxycycline without change. BCID negative.  History of BPH/acute urinary retention: Has had TURP by Dr. Annabell Howells, urology October 2018.  Not on meds PTA.  Patient had intermittent urinary retention with bladder scan showing 800 mL &  500 mL but declining in and out catheterization.  States that he has been urinating "beautifully".  No clinical urinary retention today.  Renal ultrasound without hydronephrosis.  Continue Flomax.   Close outpatient follow-up with urology.  Hypokalemia: Replaced  Hypomagnesemia: Replaced  Normocytic anemia: Possibly chronic disease.  Stable.  Uncontrolled type II DM with hyperglycemia: A1c 7.  Now worsened by steroids.  Treated in the hospital with SSI.    Resume prior home oral hypoglycemics at discharge.  Weaning off of steroids will help.  Hyperlipidemia: Continue rosuvastatin.  CAD status post PCI: Remains on aspirin and Plavix, statins.  Essential hypertension: Controlled.  Acute on stage II chronic kidney disease: Baseline creatinine possibly in the 1.1 range.  Presented with creatinine of 1.28 which has increased to 1.41, may be related to acute urinary retention or diuresis and home use of ACEI.  Creatinine has normalized.  Renal ultrasound shows no hydronephrosis.  Follow BMP closely as outpatient while Lasix and ACEI are resumed.    Consultants:  None  Procedures:  BiPAP   Discharge Instructions  Discharge Instructions    (HEART FAILURE PATIENTS) Call MD:  Anytime you have any of the following symptoms: 1) 3 pound weight gain in 24 hours or 5 pounds in 1 week 2) shortness of breath, with or without a dry hacking cough 3) swelling in the hands, feet or stomach 4) if you have to sleep on extra pillows at night in order to breathe.   Complete by:  As directed    Call MD for:  difficulty breathing, headache or visual disturbances   Complete by:  As directed    Call MD for:  extreme fatigue   Complete by:  As directed    Call MD for:  persistant dizziness or light-headedness   Complete by:  As directed    Call MD for:  temperature >100.4   Complete by:  As directed    Diet - low sodium heart healthy   Complete by:  As directed    Increase activity slowly   Complete by:  As directed        Medication List    STOP taking these medications   atorvastatin 40 MG tablet Commonly known as:  LIPITOR     TAKE these medications   acetaminophen 500 MG  tablet Commonly known as:  TYLENOL Take 1,000 mg by mouth 2 (two) times daily.   albuterol 108 (90 Base) MCG/ACT inhaler Commonly known as:  PROVENTIL HFA;VENTOLIN HFA Inhale 1-2 puffs into the lungs every 4 (four) hours as needed for wheezing or shortness of breath.   aspirin 81 MG tablet Take 81 mg by mouth at bedtime.   B-12 5000 MCG Tbdp Take 5,000 mcg by mouth at bedtime.   clopidogrel 75 MG tablet Commonly known as:  PLAVIX TAKE 1 TABLET(75 MG) BY MOUTH AT BEDTIME What changed:  See the new instructions.   doxycycline 100 MG tablet Commonly known as:  VIBRA-TABS Take 1 tablet (100 mg total) by mouth 2 (two) times daily.   Fish Oil 1200 MG Caps Take 1,200 mg by mouth at bedtime.   furosemide 20 MG tablet Commonly known as:  LASIX Take 20 mg by mouth at bedtime.   glipiZIDE-metformin 5-500 MG tablet Commonly known as:  METAGLIP Take 2 tablets by mouth 2 (two) times daily before a meal.   nitroGLYCERIN 0.4 MG SL tablet Commonly known as:  NITROSTAT Place 0.4 mg under the tongue every 5 (five) minutes as needed  for chest pain.   predniSONE 10 MG tablet Commonly known as:  DELTASONE Take 4 tabs daily for 2 days, then 3 tabs daily for 2 days, then 2 tabs daily for 2 days, then 1 tab daily for 2 days, then stop. Start taking on:  September 14, 2018   ramipril 2.5 MG capsule Commonly known as:  ALTACE Take 2.5 mg by mouth at bedtime.   rosuvastatin 20 MG tablet Commonly known as:  CRESTOR Take 20 mg by mouth at bedtime.   tamsulosin 0.4 MG Caps capsule Commonly known as:  FLOMAX Take 1 capsule (0.4 mg total) by mouth daily. Start taking on:  September 14, 2018      Follow-up Information    Osei-Bonsu, Greggory StallionGeorge, MD. Schedule an appointment as soon as possible for a visit in 5 day(s).   Specialty:  Internal Medicine Why:  To be seen with repeat labs (CBC & BMP). Contact information: [address] [place] [address] [phone]         Nahser, Deloris PingPhilip J, MD. Schedule an appointment as soon as possible for a visit in 2 week(s).   Specialty:  Cardiology Contact information: [address] [place] [address] [phone]        Bjorn PippinWrenn, John, MD. Schedule an appointment as soon as possible for a visit in 1 week(s).   Specialty:  Urology Contact information: [address] [place] [address] [phone]          No Known Allergies    Procedures/Studies: Koreas Renal  Result Date: 09/12/2018 CLINICAL DATA:  Urinary retention. EXAM: RENAL / URINARY TRACT ULTRASOUND COMPLETE COMPARISON:  None. FINDINGS: Right Kidney: Renal measurements: 11.5 x 5.0 x 6.6 cm = volume: 200 mL. Normal echotexture. Mild cortical thinning. No hydronephrosis. Left Kidney: Renal measurements: 10.3 x 6.0 x 5.7 cm = volume: 186 mL. 12 mm cyst in the midpole. Normal echotexture. No hydronephrosis. Bladder: Appears normal for degree of bladder distention. Prominent prostate measuring 7.1 x 6.3 x 6.2 cm. IMPRESSION: Prostatomegaly. No acute findings.  No hydronephrosis. Electronically Signed   By: Charlett NoseKevin  Dover M.D.   On: 09/12/2018 18:13   Dg Chest Port 1 View  Result Date: 09/10/2018 CLINICAL DATA:  Cough and hypoxia EXAM: PORTABLE CHEST 1 VIEW COMPARISON:  05/01/2017 FINDINGS: Chronic elevation of left diaphragm. Enlarged cardiomediastinal silhouette with vascular congestion and diffuse interstitial prominence suggesting edema. Probable trace pleural effusions. IMPRESSION: 1. Enlarged cardiomediastinal silhouette with vascular congestion and diffuse interstitial opacity suspect for edema. Probable trace pleural effusion 2. Chronic elevation of left diaphragm Electronically Signed   By: Jasmine PangKim  Fujinaga M.D.   On: 09/10/2018 20:26      Subjective: Denies complaints.  Dyspnea resolved and breathing back to baseline.  No cough or chest pain reported.  No difficulty urinating.  States that he has been urinating "beautifully".  States that  his appetite which was poor has also improved.  As per RN, no acute issues noted.  Discharge Exam:  Vitals:   09/13/18 0046 09/13/18 0249 09/13/18 0700 09/13/18 0913  BP: (!) 154/60  (!) 141/66   Pulse: 60  84   Resp: (!) 21  20 20   Temp:      TempSrc:      SpO2: 93% 95% 97%   Weight:      Height:        General exam: Pleasant elderly male, moderately built and nourished lying comfortably propped up in bed without distress. Respiratory  system:  Clear to auscultation without wheezing, rhonchi or crackles.  No increased work of breathing. Cardiovascular system: S1 & S2 heard, RRR. No JVD, murmurs, rubs, gallops or clicks. No pedal edema.  No presacral edema.  Telemetry personally reviewed: Sinus rhythm. Gastrointestinal system: Abdomen is nondistended, soft and nontender. No organomegaly or masses felt. Normal bowel sounds heard.  Central nervous system: Alert and oriented. No focal neurological deficits. Extremities: Symmetric 5 x 5 power. Skin: No rashes, lesions or ulcers Psychiatry: Judgement and insight appear normal. Mood & affect appropriate.     The results of significant diagnostics from this hospitalization (including imaging, microbiology, ancillary and laboratory) are listed below for reference.     Microbiology: Recent Results (from the past 240 hour(s))  Blood Culture (routine x 2)     Status: None (Preliminary result)   Collection Time: 09/10/18  8:36 PM  Result Value Ref Range Status   Specimen Description BLOOD RIGHT FOREARM  Final   Special Requests   Final    BOTTLES DRAWN AEROBIC AND ANAEROBIC Blood Culture adequate volume Performed at [facility] Lab, [address], [place], [place] [address]    Culture NO GROWTH 2 DAYS  Final   Report Status PENDING  Incomplete  Blood Culture (routine x 2)     Status: None (Preliminary result)   Collection Time: 09/10/18  8:55 PM  Result Value Ref Range Status   Specimen Description BLOOD LEFT ANTECUBITAL  Final    Special Requests   Final    BOTTLES DRAWN AEROBIC AND ANAEROBIC Blood Culture adequate volume   Culture  Setup Time   Final    GRAM POSITIVE COCCI IN CHAINS ANAEROBIC BOTTLE ONLY CRITICAL RESULT CALLED TO, READ BACK BY AND VERIFIED WITH: E. DEJA, PHARMD AT 1015 ON 09/12/18 BY C. JESSUP, MLT. Performed at [facility] Lab, [address], [place], [place] [address]    Culture GRAM POSITIVE COCCI  Final   Report Status PENDING  Incomplete  Blood Culture ID Panel (Reflexed)     Status: None   Collection Time: 09/10/18  8:55 PM  Result Value Ref Range Status   Enterococcus species NOT DETECTED NOT DETECTED Final   Listeria monocytogenes NOT DETECTED NOT DETECTED Final   Staphylococcus species NOT DETECTED NOT DETECTED Final   Staphylococcus aureus (BCID) NOT DETECTED NOT DETECTED Final   Streptococcus species NOT DETECTED NOT DETECTED Final   Streptococcus agalactiae NOT DETECTED NOT DETECTED Final   Streptococcus pneumoniae NOT DETECTED NOT DETECTED Final   Streptococcus pyogenes NOT DETECTED NOT DETECTED Final   Acinetobacter baumannii NOT DETECTED NOT DETECTED Final   Enterobacteriaceae species NOT DETECTED NOT DETECTED Final   Enterobacter cloacae complex NOT DETECTED NOT DETECTED Final   Escherichia coli NOT DETECTED NOT DETECTED Final   Klebsiella oxytoca NOT DETECTED NOT DETECTED Final   Klebsiella pneumoniae NOT DETECTED NOT DETECTED Final   Proteus species NOT DETECTED NOT DETECTED Final   Serratia marcescens NOT DETECTED NOT DETECTED Final   Haemophilus influenzae NOT DETECTED NOT DETECTED Final   Neisseria meningitidis NOT DETECTED NOT DETECTED Final   Pseudomonas aeruginosa NOT DETECTED NOT DETECTED Final   Candida albicans NOT DETECTED NOT DETECTED Final   Candida glabrata NOT DETECTED NOT DETECTED Final   Candida krusei NOT DETECTED NOT DETECTED Final   Candida parapsilosis NOT DETECTED NOT DETECTED Final   Candida tropicalis NOT DETECTED NOT DETECTED Final    Comment:  Performed at [facility] Lab, [address]  [place], [place] [address]     Labs: CBC: Recent Labs  Lab 09/10/18 2040 09/10/18 2217  WBC 9.5  --   NEUTROABS 7.8*  --   HGB 10.8* 11.2*  HCT 36.2* 33.0*  MCV 89.6  --   PLT 198  --    Basic Metabolic Panel: Recent Labs  Lab 09/10/18 2040 09/10/18 2217 09/11/18 0331 09/12/18 0431 09/13/18 0433  NA 133* 132* 135 136 135  K 3.2* 3.1* 3.3* 3.7 4.2  CL 91*  --  91* 91* 93*  CO2 26  --  29 32 34*  GLUCOSE 192*  --  356* 228* 205*  BUN 23  --  24* 39* 41*  CREATININE 1.28*  --  1.41* 1.43* 1.19  CALCIUM 8.2*  --  8.4* 9.1 9.4  MG  --   --  1.6* 1.8  --    Liver Function Tests: Recent Labs  Lab 09/10/18 2040  AST 19  ALT 15  ALKPHOS 78  BILITOT 0.8  PROT 6.3*  ALBUMIN 3.6   BNP (last 3 results) Recent Labs    09/10/18 2036  BNP 190.7*   CBG: Recent Labs  Lab 09/12/18 1059 09/12/18 1636 09/12/18 2240 09/13/18 0745 09/13/18 1144  GLUCAP 336* 270* 324* 193* 293*   Hgb A1c Recent Labs    09/11/18 0331  HGBA1C 7.0*   Urinalysis    Component Value Date/Time   COLORURINE YELLOW 09/11/2018 0530   APPEARANCEUR CLEAR 09/11/2018 0530   LABSPEC 1.010 09/11/2018 0530   PHURINE 5.0 09/11/2018 0530   GLUCOSEU >=500 (A) 09/11/2018 0530   HGBUR NEGATIVE 09/11/2018 0530   BILIRUBINUR NEGATIVE 09/11/2018 0530   KETONESUR NEGATIVE 09/11/2018 0530   PROTEINUR 30 (A) 09/11/2018 0530   NITRITE NEGATIVE 09/11/2018 0530   LEUKOCYTESUR NEGATIVE 09/11/2018 0530      Time coordinating discharge: 40 minutes  SIGNED:  Marcellus Scott, MD, FACP, [facility]. [facility]  To contact the attending provider between 7A-7P or the covering provider during after hours 7P-7A, please log into the web site [email or web address] and access using universal [place] password for that web site. If you do not have the password, please call the hospital operator.

## 2018-09-14 LAB — CULTURE, BLOOD (ROUTINE X 2): Special Requests: ADEQUATE

## 2018-09-15 LAB — CULTURE, BLOOD (ROUTINE X 2)
Culture: NO GROWTH
Special Requests: ADEQUATE

## 2018-09-20 DIAGNOSIS — I5032 Chronic diastolic (congestive) heart failure: Secondary | ICD-10-CM | POA: Diagnosis not present

## 2018-09-20 DIAGNOSIS — E1151 Type 2 diabetes mellitus with diabetic peripheral angiopathy without gangrene: Secondary | ICD-10-CM | POA: Diagnosis not present

## 2018-09-20 DIAGNOSIS — I2510 Atherosclerotic heart disease of native coronary artery without angina pectoris: Secondary | ICD-10-CM | POA: Diagnosis not present

## 2018-09-20 DIAGNOSIS — I10 Essential (primary) hypertension: Secondary | ICD-10-CM | POA: Diagnosis not present

## 2018-09-20 DIAGNOSIS — E782 Mixed hyperlipidemia: Secondary | ICD-10-CM | POA: Diagnosis not present

## 2018-09-20 DIAGNOSIS — N182 Chronic kidney disease, stage 2 (mild): Secondary | ICD-10-CM | POA: Diagnosis not present

## 2018-09-20 DIAGNOSIS — J449 Chronic obstructive pulmonary disease, unspecified: Secondary | ICD-10-CM | POA: Diagnosis not present

## 2018-09-24 NOTE — Patient Instructions (Addendum)
Medication Instructions:  Your physician recommends that you continue on your current medications as directed. Please refer to the Current Medication list given to you today.  If you need a refill on your cardiac medications before your next appointment, please call your pharmacy.   Lab work: None  If you have labs (blood work) drawn today and your tests are completely normal, you will receive your results only by: Marland Kitchen MyChart Message (if you have MyChart) OR . A paper copy in the mail If you have any lab test that is abnormal or we need to change your treatment, we will call you to review the results.  Testing/Procedures: None  Follow-Up: At [facility], you and your health needs are our priority.  As part of our continuing mission to provide you with exceptional heart care, we have created designated Provider Care Teams.  These Care Teams include your primary Cardiologist (physician) and Advanced Practice Providers (APPs -  Physician Assistants and Nurse Practitioners) who all work together to provide you with the care you need, when you need it. You will need a follow up appointment in:  6 months.  Please call our office 2 months in advance to schedule this appointment.  You may see Mertie Moores, MD or one of the following Advanced Practice Providers on your designated Care Team: Richardson Dopp, PA-C [place], [place] . Daune Perch, NP  Any Other Special Instructions Will Be Listed Below (If Applicable).  Insuficiencia cardaca y actividad fsica Heart Failure and Exercise La insuficiencia cardaca es una afeccin por la que el corazn no recibe o no bombea suficiente sangre y oxgeno para sustentar el organismo y sus funciones. La insuficiencia cardaca es una enfermedad de larga duracin (crnica). Vivir con insuficiencia cardaca puede ser difcil. No obstante, seguir las indicaciones del mdico sobre un estilo de vida saludable puede ayudar a [employer] sntomas. Esto incluye  elegir el plan de actividad fsica correcto. Hacer una actividad fsica diariamente es importante despus de un diagnstico de insuficiencia cardaca. Es posible que tenga algunas restricciones de [place], por lo tanto, hable con su mdico antes de hacer cualquier ejercicio. Cules son los beneficios de la actividad fsica? La actividad fsica puede tener los siguientes beneficios:  Fortalece los msculos cardacos.  Reduce la presin arterial.  Disminuye el nivel de colesterol.  Ayuda a Pharmacist, hospital.  Mantiene los [employer]  Mejora la circulacin sangunea.  Ayuda a que el organismo use mejor el oxgeno. Esto alivia sntomas como cansancio y falta de [place].  Colabora con la salud mental al reducir el riesgo de depresin y otros problemas.  Mejora la calidad de vida.  Disminuye la posibilidad de una hospitalizacin por insuficiencia cardaca. Qu es un plan de actividad fsica? Un plan de actividad fsica es un conjunto de ejercicios especficos y actividades de Chiropodist. Trabajar con su mdico para elaborar un plan de actividad fsica que le resulte adecuado. El plan puede incluir lo siguiente:  Diferentes tipos de ejercicios y cmo hacerlos.  Ejercicios de rehabilitacin cardaca. Son programas supervisados creados para Secondary school teacher. Qu son los ejercicios de fortalecimiento? Los ejercicios de fortalecimiento son un tipo de actividad fsica que requiere el uso de la resistencia para mejorar la fortaleza muscular. Los ejercicios de fortalecimiento habitualmente incluyen movimientos repetitivos. Estos tipos de ejercicios pueden incluir ls siguientes:  Levantar pesas.  Usar mquinas de levantamiento de pesas.  Usar bandas elsticas.  Usar pesas rusas.  Usar Chief Strategy Officer, como flexiones de [place] y  sentadillas. Qu son los ejercicios de equilibrio? Constituyen otro tipo de [place] fsica. Fortalecen los [employer] de la espalda, el estmago y  la pelvis (msculos estabilizadores del tronco) y [place] equilibrio. Tambin pueden disminuir el riesgo de sufrir cadas. Estos tipos de ejercicios pueden incluir ls siguientes:  Pararse sobre una sola pierna.  Caminar hacia atrs, de costado y en lnea recta.  Pararse a partir de una posicin sentada sin usar las manos.  Pasar su peso de una pierna a la otra.  Elevar una pierna en frente suyo.  Hacer tai chi. Este es un tipo de ejercicio que [place] movimientos lentos y respiracin profunda. Cmo es posible aumentar la flexibilidad? Tener mejor flexibilidad puede evitar las cadas. Tambin permite elongar los msculos, mejorar la amplitud de movimientos y [place] a las articulaciones. Puede incrementar su flexibilidad haciendo lo siguiente:  Hacer tai chi.  Yoga.  Estiramiento. Qu cantidad de ejercicios aerbicos debera hacer?  Los ejercicios aerbicos fortalecen los sistemas circulatorio y Risk manager, y [place] uso de oxgeno por parte del organismo. Ejemplos de ejercicios aerbicos incluyen caminar, Catering manager, correr y Social worker. Consulte al mdico para saber qu cantidad de ejercicio aerbico es prudente para usted.  Para hacer estos ejercicios:  Comience a Occupational psychologist y limitando la cantidad de tiempo al principio. Puede comenzar con 5 minutos de ejercicio Firefighter.  Aumente gradualmente ms minutos hasta que pueda hacer al menos 30 minutos de ejercicios, de [place] segura, 4 das a la semana como mnimo. Resumen  La actividad fsica diaria es importante despus de un diagnstico de insuficiencia cardaca.  El ejercicio puede fortalecer los msculos del corazn. Adems ofrece otros beneficios que [employer].  Hable con su mdico antes de hacer cualquier ejercicio. Esta informacin no tiene Marine scientist el consejo del mdico. Asegrese de hacerle al mdico cualquier pregunta que tenga. Document Released: 02/10/2017 Document  Revised: 02/10/2017 Document Reviewed: 02/10/2017 Elsevier Interactive Patient Education  2019 [employer].

## 2018-09-24 NOTE — Progress Notes (Signed)
Cardiology Office Note:    Date:  09/28/2018   ID:  Angel [name], DOB [birth year], MRN [id number]  PCP:  Angel Mccreedy, MD  Cardiologist:  Angel Moores, MD  Referring MD: Angel Mccreedy, MD   Chief Complaint  Patient presents with  . Hospitalization Follow-up    CHF exacerbation    History of Present Illness:    Angel [name] is a 83 y.o. male with a past medical history significant for CAD s/p PCI and stent to LAD, chronic diastolic CHF, COPD, left hemidiaphragm paralysis, diabetes type 2, hyperlipidemia, hypertension, BPH.   The patient was hospitalized 09/10/18-09/13/18 for acute COPD exacerbation and acute CHF exacerbation.  He was managed by the hospitalist team.  The patient had presented with complaints of sore throat, chest congestion, dry cough and progressive dyspnea and wheezing.  He required BiPAP temporarily.  He was treated with IV steroids, bronchodilators and antibiotics.  He only required 2 doses of IV Lasix 40 mg.  His respiratory difficulty was attributed more to COPD and CHF rather than infectious etiology.  Patient was discharged home on oxygen via nasal cannula at 3 L/min.  Angel [name] is here today alone for hospital followup. He feels much better. His breathing is back to normal and he is no longer using oxygen. He sleeps well at night with no breathing difficulty. He has no chest pain/pressure, dizziness or edema. He reports compliance with medications and tolerates them well.   Past Medical History:  Diagnosis Date  . Arthritis   . Benign prostatic hypertrophy   . BPH (benign prostatic hyperplasia)   . COPD (chronic obstructive pulmonary disease) ([place])   . Coronary artery disease    status post PTCA and stenting of the left anterior descending artery  . Diabetes mellitus   . Dyspnea    with exertion   . Hyperlipidemia   . Hypertension   . Paralyzed hemidiaphragm    Left  . Pneumonia    50 years ago    Past Surgical History:  Procedure  Laterality Date  . THULIUM LASER TURP (TRANSURETHRAL RESECTION OF PROSTATE) N/A 10/30/2016   Procedure: THULIUM LASER TURP (TRANSURETHRAL RESECTION OF PROSTATE);  Surgeon: Irine Seal, MD;  Location: [facility];  Service: Urology;  Laterality: N/A;  . TRANSURETHRAL RESECTION OF PROSTATE N/A 05/05/2017   Procedure: TRANSURETHRAL RESECTION OF THE PROSTATE (TURP);  Surgeon: Irine Seal, MD;  Location: [facility];  Service: Urology;  Laterality: N/A;  NEEDS 60 MIN FOR PROCEDURE    Current Medications: Current Meds  Medication Sig  . acetaminophen (TYLENOL) 500 MG tablet Take 1,000 mg by mouth 2 (two) times daily.  Marland Kitchen albuterol (PROVENTIL HFA;VENTOLIN HFA) 108 (90 Base) MCG/ACT inhaler Inhale 1-2 puffs into the lungs every 4 (four) hours as needed for wheezing or shortness of breath.  Marland Kitchen aspirin 81 MG tablet Take 81 mg by mouth at bedtime.   . clopidogrel (PLAVIX) 75 MG tablet TAKE 1 TABLET(75 MG) BY MOUTH AT BEDTIME  . doxycycline (VIBRA-TABS) 100 MG tablet Take 1 tablet (100 mg total) by mouth 2 (two) times daily.  . furosemide (LASIX) 20 MG tablet Take 20 mg by mouth at bedtime.  Marland Kitchen glipiZIDE-metformin (METAGLIP) 5-500 MG tablet Take 2 tablets by mouth 2 (two) times daily before a meal.   . Methylcobalamin (B-12) 5000 MCG TBDP Take 5,000 mcg by mouth at bedtime.  . nitroGLYCERIN (NITROSTAT) 0.4 MG SL tablet Place 0.4 mg under the tongue every 5 (five) minutes as needed for chest pain.  Marland Kitchen  Omega-3 Fatty Acids (FISH OIL) 1200 MG CAPS Take 1,200 mg by mouth at bedtime.  . ramipril (ALTACE) 2.5 MG capsule Take 2.5 mg by mouth at bedtime.  . rosuvastatin (CRESTOR) 20 MG tablet Take 20 mg by mouth at bedtime.  . tamsulosin (FLOMAX) 0.4 MG CAPS capsule Take 1 capsule (0.4 mg total) by mouth daily.     Allergies:   Patient has no known allergies.   Social History   Socioeconomic History  . Marital status: Married    Spouse name: Not on file  . Number of children: Not on file  . Years of education: Not on  file  . Highest education level: Not on file  Occupational History  . Not on file  Social Needs  . Financial resource strain: Not on file  . Food insecurity:    Worry: Not on file    Inability: Not on file  . Transportation needs:    Medical: Not on file    Non-medical: Not on file  Tobacco Use  . Smoking status: Never Smoker  . Smokeless tobacco: Never Used  Substance and Sexual Activity  . Alcohol use: No  . Drug use: No  . Sexual activity: Never  Lifestyle  . Physical activity:    Days per week: Not on file    Minutes per session: Not on file  . Stress: Not on file  Relationships  . Social connections:    Talks on phone: Not on file    Gets together: Not on file    Attends religious service: Not on file    Active member of club or organization: Not on file    Attends meetings of clubs or organizations: Not on file    Relationship status: Not on file  Other Topics Concern  . Not on file  Social History Narrative  . Not on file     Family History: The patient's family history includes Cancer in his brother and father. ROS:   Please see the history of present illness.     All other systems reviewed and are negative.  EKGs/Labs/Other Studies Reviewed:    The following studies were reviewed today:  Echocardiogram 09/11/2018 IMPRESSIONS   1. The left ventricle was not well visualized. LVEF estimated at 55%, cannot adequately assess wall motion due to very limited images. Left ventricular diastolic Doppler parameters are consistent with impaired relaxation.  2. The mitral valve was not well visualized. There is mild mitral annular calcification present.  3. The interatrial septum was not well visualized.  4. The aortic valve was not well visualized. There is calcufucation noted and mean gradient of 19 mmHg, cannot exclude possible mild degree of aortic stenosis.  5. The tricuspid valve is not well visualized. Tricuspid valve regurgitation was not assessed by color  flow Doppler.  FINDINGS  Left Ventricle: The left ventricle was not well visualized. The cavity size was left ventricular size was not assessed. The left ventricular wall thickness was not assessed. Left ventricular diastolic Doppler parameters are consistent with impaired  relaxation Right Ventricle: The right ventricle was not well visualized. The cavity was not assessed. There is right vetricular wall thickness was not assessed. Left Atrium: left atrial size was normal in size Right Atrium: right atrial size was normal in size. Interatrial Septum: The interatrial septum was not well visualized. Pericardium: There is no evidence of pericardial effusion. Mitral Valve: The mitral valve was not well visualized. There is mild mitral annular calcification present. Mitral valve regurgitation is  trivial by color flow Doppler. Tricuspid Valve: The tricuspid valve is not well visualized. Tricuspid valve regurgitation was not assessed by color flow Doppler. Aortic Valve: The aortic valve was not well visualized Aortic valve regurgitation was not visualized by color flow Doppler. Pulmonic Valve: The pulmonic valve was not assessed. Pulmonic valve regurgitation was not assessed by color flow Doppler.  EKG:  EKG is not ordered today.    Recent Labs: 09/10/2018: ALT 15; B Natriuretic Peptide 190.7; Hemoglobin 11.2; Platelets 198 09/12/2018: Magnesium 1.8 09/13/2018: BUN 41; Creatinine, Ser 1.19; Potassium 4.2; Sodium 135   Recent Lipid Panel    Component Value Date/Time   CHOL 104 08/16/2018 1106   TRIG 94 08/16/2018 1106   HDL 44 08/16/2018 1106   CHOLHDL 2.4 08/16/2018 1106   CHOLHDL 3.3 02/04/2016 1011   VLDL 34 (H) 02/04/2016 1011   LDLCALC 41 08/16/2018 1106    Physical Exam:    VS:  BP (!) 112/52   Pulse 82   Ht '5\' 5"'$  (1.651 m)   Wt 146 lb 12.8 oz (66.6 kg)   SpO2 93%   BMI 24.43 kg/m     Wt Readings from Last 3 Encounters:  09/28/18 146 lb 12.8 oz (66.6 kg)  09/12/18 142 lb  10.2 oz (64.7 kg)  08/16/18 146 lb 6.4 oz (66.4 kg)     Physical Exam  Constitutional: He is oriented to person, place, and time. He appears well-developed and well-nourished. No distress.  HENT:  Head: Normocephalic and atraumatic.  Neck: Normal range of motion. Neck supple. No JVD present.  Cardiovascular: Normal rate, regular rhythm and intact distal pulses. Exam reveals no gallop and no friction rub.  Murmur heard.  Harsh midsystolic murmur is present with a grade of 2/6 at the upper right sternal border radiating to the neck. Pulmonary/Chest: Effort normal and breath sounds normal. No respiratory distress. He has no wheezes. He has no rales.  Abdominal: Soft. Bowel sounds are normal. He exhibits no distension.  Musculoskeletal: Normal range of motion.        General: No deformity or edema.  Neurological: He is alert and oriented to person, place, and time.  Skin: Skin is warm and dry.  Psychiatric: He has a normal mood and affect. His behavior is normal. Judgment and thought content normal.  Vitals reviewed.  ASSESSMENT:    1. Chronic diastolic CHF (congestive heart failure) ([place])   2. COPD exacerbation ([place])   3. Coronary artery disease involving native coronary artery of native heart without angina pectoris   4. Essential hypertension   5. Nonrheumatic aortic valve stenosis   6. Pure hypercholesterolemia    PLAN:    In order of problems listed above:  Acute on chronic diastolic CHF -Patient hospitalized 09/10/2018 for acute exacerbation of COPD and CHF. -LVEF 55% by echo 09/11/2018. -Patient only required 2 doses of IV Lasix and then resumed on his home heart failure medications. -Patient discharged home on his usual heart failure medicines including Lasix 20 mg at bedtime.  Patient was continued on ramipril 2.5 mg daily -Pt is doing very well today. Appears euvolemic.  -Printed CHF education provided in Spanish.  COPD -Recent hospitalization for COPD  exacerbation. -Followed by pulmonology in the outpatient setting. -Pt reports breathing much better, back to normal. No longer using oxygen.   CAD s/p PTCA and stenting of the LAD -Aspirin and Plavix -No anginal symptoms.   Hypertension -BP well controlled. No changes.   Aortic stenosis -Mild by echo in  08/2018  Hyperlipidemia -On rosuvastatin 20 mg daily -Lipids well controlled with LDL of 41 08/16/2018.  Paralyzed left hemidiaphragm  Medication Adjustments/Labs and Tests Ordered: Current medicines are reviewed at length with the patient today.  Concerns regarding medicines are outlined above. Labs and tests ordered and medication changes are outlined in the patient instructions below:  Patient Instructions  Medication Instructions:  Your physician recommends that you continue on your current medications as directed. Please refer to the Current Medication list given to you today.  If you need a refill on your cardiac medications before your next appointment, please call your pharmacy.   Lab work: None  If you have labs (blood work) drawn today and your tests are completely normal, you will receive your results only by: Marland Kitchen MyChart Message (if you have MyChart) OR . A paper copy in the mail If you have any lab test that is abnormal or we need to change your treatment, we will call you to review the results.  Testing/Procedures: None  Follow-Up: At [facility], you and your health needs are our priority.  As part of our continuing mission to provide you with exceptional heart care, we have created designated Provider Care Teams.  These Care Teams include your primary Cardiologist (physician) and Advanced Practice Providers (APPs -  Physician Assistants and Nurse Practitioners) who all work together to provide you with the care you need, when you need it. You will need a follow up appointment in:  6 months.  Please call our office 2 months in advance to schedule this  appointment.  You may see Angel Moores, MD or one of the following Advanced Practice Providers on your designated Care Team: Richardson Dopp, PA-C [place], [place] . Daune Perch, NP  Any Other Special Instructions Will Be Listed Below (If Applicable).  Insuficiencia cardaca y actividad fsica Heart Failure and Exercise La insuficiencia cardaca es una afeccin por la que el corazn no recibe o no bombea suficiente sangre y oxgeno para sustentar el organismo y sus funciones. La insuficiencia cardaca es una enfermedad de larga duracin (crnica). Vivir con insuficiencia cardaca puede ser difcil. No obstante, seguir las indicaciones del mdico sobre un estilo de vida saludable puede ayudar a [employer] sntomas. Esto incluye elegir el plan de actividad fsica correcto. Hacer una actividad fsica diariamente es importante despus de un diagnstico de insuficiencia cardaca. Es posible que tenga algunas restricciones de [place], por lo tanto, hable con su mdico antes de hacer cualquier ejercicio. Cules son los beneficios de la actividad fsica? La actividad fsica puede tener los siguientes beneficios:  Fortalece los msculos cardacos.  Reduce la presin arterial.  Disminuye el nivel de colesterol.  Ayuda a Pharmacist, hospital.  Mantiene los [employer]  Mejora la circulacin sangunea.  Ayuda a que el organismo use mejor el oxgeno. Esto alivia sntomas como cansancio y falta de [place].  Colabora con la salud mental al reducir el riesgo de depresin y otros problemas.  Mejora la calidad de vida.  Disminuye la posibilidad de una hospitalizacin por insuficiencia cardaca. Qu es un plan de actividad fsica? Un plan de actividad fsica es un conjunto de ejercicios especficos y actividades de Chiropodist. Trabajar con su mdico para elaborar un plan de actividad fsica que le resulte adecuado. El plan puede incluir lo siguiente:  Diferentes tipos de ejercicios y cmo  hacerlos.  Ejercicios de rehabilitacin cardaca. Son programas supervisados creados para Secondary school teacher. Qu son los ejercicios de fortalecimiento? Los ejercicios de fortalecimiento  son un tipo de actividad fsica que requiere el uso de la resistencia para mejorar la fortaleza muscular. Los ejercicios de fortalecimiento habitualmente incluyen movimientos repetitivos. Estos tipos de ejercicios pueden incluir ls siguientes:  Levantar pesas.  Usar mquinas de levantamiento de pesas.  Usar bandas elsticas.  Usar pesas rusas.  Usar Chief Strategy Officer, como flexiones de brazos y sentadillas. Qu son los ejercicios de equilibrio? Constituyen otro tipo de [place] fsica. Fortalecen los [employer] de la espalda, el estmago y la pelvis (msculos estabilizadores del tronco) y [place] equilibrio. Tambin pueden disminuir el riesgo de sufrir cadas. Estos tipos de ejercicios pueden incluir ls siguientes:  Pararse sobre una sola pierna.  Caminar hacia atrs, de costado y en lnea recta.  Pararse a partir de una posicin sentada sin usar las manos.  Pasar su peso de una pierna a la otra.  Elevar una pierna en frente suyo.  Hacer tai chi. Este es un tipo de ejercicio que [place] movimientos lentos y respiracin profunda. Cmo es posible aumentar la flexibilidad? Tener mejor flexibilidad puede evitar las cadas. Tambin permite elongar los msculos, mejorar la amplitud de movimientos y [place] a las articulaciones. Puede incrementar su flexibilidad haciendo lo siguiente:  Hacer tai chi.  Yoga.  Estiramiento. Qu cantidad de ejercicios aerbicos debera hacer?  Los ejercicios aerbicos fortalecen los sistemas circulatorio y Risk manager, y [place] uso de oxgeno por parte del organismo. Ejemplos de ejercicios aerbicos incluyen caminar, Catering manager, correr y Social worker. Consulte al mdico para saber qu cantidad de ejercicio aerbico es prudente para usted.  Para hacer  estos ejercicios:  Comience a Occupational psychologist y limitando la cantidad de tiempo al principio. Puede comenzar con 5 minutos de ejercicio Firefighter.  Aumente gradualmente ms minutos hasta que pueda hacer al menos 30 minutos de ejercicios, de [place] segura, 4 das a la semana como mnimo. Resumen  La actividad fsica diaria es importante despus de un diagnstico de insuficiencia cardaca.  El ejercicio puede fortalecer los msculos del corazn. Adems ofrece otros beneficios que [employer].  Hable con su mdico antes de hacer cualquier ejercicio. Esta informacin no tiene Marine scientist el consejo del mdico. Asegrese de hacerle al mdico cualquier pregunta que tenga. Document Released: 02/10/2017 Document Revised: 02/10/2017 Document Reviewed: 02/10/2017 Elsevier Interactive Patient Education  2019 [place], Daune Perch, NP  09/28/2018 8:25 AM    [facility]

## 2018-09-27 DIAGNOSIS — N401 Enlarged prostate with lower urinary tract symptoms: Secondary | ICD-10-CM | POA: Diagnosis not present

## 2018-09-27 DIAGNOSIS — R3914 Feeling of incomplete bladder emptying: Secondary | ICD-10-CM | POA: Diagnosis not present

## 2018-09-27 DIAGNOSIS — R8271 Bacteriuria: Secondary | ICD-10-CM | POA: Diagnosis not present

## 2018-09-27 DIAGNOSIS — R972 Elevated prostate specific antigen [PSA]: Secondary | ICD-10-CM | POA: Diagnosis not present

## 2018-09-28 ENCOUNTER — Ambulatory Visit (INDEPENDENT_AMBULATORY_CARE_PROVIDER_SITE_OTHER): Payer: BLUE CROSS/BLUE SHIELD | Admitting: Cardiology

## 2018-09-28 ENCOUNTER — Encounter (INDEPENDENT_AMBULATORY_CARE_PROVIDER_SITE_OTHER): Payer: Self-pay

## 2018-09-28 ENCOUNTER — Encounter: Payer: Self-pay | Admitting: Cardiology

## 2018-09-28 VITALS — BP 112/52 | HR 82 | Ht 65.0 in | Wt 146.8 lb

## 2018-09-28 DIAGNOSIS — I5032 Chronic diastolic (congestive) heart failure: Secondary | ICD-10-CM | POA: Diagnosis not present

## 2018-09-28 DIAGNOSIS — I350 Nonrheumatic aortic (valve) stenosis: Secondary | ICD-10-CM

## 2018-09-28 DIAGNOSIS — I10 Essential (primary) hypertension: Secondary | ICD-10-CM | POA: Diagnosis not present

## 2018-09-28 DIAGNOSIS — J441 Chronic obstructive pulmonary disease with (acute) exacerbation: Secondary | ICD-10-CM

## 2018-09-28 DIAGNOSIS — E7800 Pure hypercholesterolemia, unspecified: Secondary | ICD-10-CM

## 2018-09-28 DIAGNOSIS — I2510 Atherosclerotic heart disease of native coronary artery without angina pectoris: Secondary | ICD-10-CM

## 2019-02-27 ENCOUNTER — Other Ambulatory Visit: Payer: Self-pay | Admitting: Cardiovascular Disease

## 2019-02-28 ENCOUNTER — Telehealth: Payer: Self-pay | Admitting: Cardiovascular Disease

## 2019-02-28 ENCOUNTER — Other Ambulatory Visit: Payer: Self-pay | Admitting: *Deleted

## 2019-02-28 NOTE — Telephone Encounter (Signed)
 *  STAT* If patient is at the pharmacy, call can be transferred to refill team.   1. Which medications need to be refilled? (please list name of each medication and dose if known) clopidogrel (PLAVIX) 75 MG tablet  2. Which pharmacy/location (including street and city if local pharmacy) is medication to be sent to? [facility]  3. Do they need a 30 day or 90 day supply? [place]

## 2019-02-28 NOTE — Telephone Encounter (Signed)
Outpatient Medication Detail   Disp Refills Start End   clopidogrel (PLAVIX) 75 MG tablet 90 tablet 1 02/28/2019    Sig: TAKE 1 TABLET(75 MG) BY MOUTH AT BEDTIME   Sent to pharmacy as: clopidogrel (PLAVIX) 75 MG tablet   E-Prescribing Status: Receipt confirmed by pharmacy (02/28/2019 3:06 PM EDT)   [id number] - [place], [address] AT [address]

## 2019-04-04 ENCOUNTER — Other Ambulatory Visit: Payer: Self-pay

## 2019-04-04 ENCOUNTER — Ambulatory Visit (INDEPENDENT_AMBULATORY_CARE_PROVIDER_SITE_OTHER): Payer: BC Managed Care – PPO | Admitting: Cardiovascular Disease

## 2019-04-04 ENCOUNTER — Encounter (INDEPENDENT_AMBULATORY_CARE_PROVIDER_SITE_OTHER): Payer: Self-pay

## 2019-04-04 ENCOUNTER — Encounter: Payer: Self-pay | Admitting: Cardiovascular Disease

## 2019-04-04 VITALS — BP 138/62 | HR 89 | Ht 65.0 in | Wt 143.8 lb

## 2019-04-04 DIAGNOSIS — I350 Nonrheumatic aortic (valve) stenosis: Secondary | ICD-10-CM

## 2019-04-04 DIAGNOSIS — I2510 Atherosclerotic heart disease of native coronary artery without angina pectoris: Secondary | ICD-10-CM

## 2019-04-04 DIAGNOSIS — I10 Essential (primary) hypertension: Secondary | ICD-10-CM | POA: Diagnosis not present

## 2019-04-04 DIAGNOSIS — E782 Mixed hyperlipidemia: Secondary | ICD-10-CM

## 2019-04-04 LAB — BASIC METABOLIC PANEL
BUN/Creatinine Ratio: 33 — ABNORMAL HIGH (ref 10–24)
BUN: 37 mg/dL — ABNORMAL HIGH (ref 8–27)
CO2: 26 mmol/L (ref 20–29)
Calcium: 9.8 mg/dL (ref 8.6–10.2)
Chloride: 97 mmol/L (ref 96–106)
Creatinine, Ser: 1.13 mg/dL (ref 0.76–1.27)
GFR calc Af Amer: 70 mL/min/{1.73_m2} (ref >59)
GFR calc non Af Amer: 61 mL/min/{1.73_m2} (ref >59)
Glucose: 196 mg/dL — ABNORMAL HIGH (ref 65–99)
Potassium: 4.3 mmol/L (ref 3.5–5.2)
Sodium: 138 mmol/L (ref 134–144)

## 2019-04-04 LAB — HEPATIC FUNCTION PANEL
ALT: 7 IU/L (ref 0–44)
AST: 8 IU/L (ref 0–40)
Albumin: 4.5 g/dL (ref 3.6–4.6)
Alkaline Phosphatase: 75 IU/L (ref 39–117)
Bilirubin Total: 0.3 mg/dL (ref 0.0–1.2)
Bilirubin, Direct: 0.11 mg/dL (ref 0.00–0.40)
Total Protein: 6.3 g/dL (ref 6.0–8.5)

## 2019-04-04 LAB — LIPID PANEL
Chol/HDL Ratio: 2.5 ratio (ref 0.0–5.0)
Cholesterol, Total: 121 mg/dL (ref 100–199)
HDL: 49 mg/dL (ref >39)
LDL Chol Calc (NIH): 53 mg/dL (ref 0–99)
Triglycerides: 99 mg/dL (ref 0–149)
VLDL Cholesterol Cal: 19 mg/dL (ref 5–40)

## 2019-04-04 NOTE — Progress Notes (Signed)
Angel [name] Date of Birth  1936/05/25    [address]    [address]     [place], [place]  [address]       Problems: 1. Coronary artery disease-status post PTCA and stenting of the LAD 2. Paralyzed left hemidiaphragm 3. Diabetes mellitus 4. Hypertension 5. Hyperlipidemia   Angel [name] is a 83 yo with the above noted hx.  No chest pain.  He remains active .  He's not had any episodes of chest pain or shortness breath. He still exercises on an intermittent basis.  Nov 29, 2012:  Angel [name] is doing well. No CP,  Breathing is the same- slightly short of breath.  He is still working 4 days a week.  He has put in a garden this spring - has tomatoes, peppers, etc.   Nov. 10, 2014:  Angel [name] is doing well.  No angina.    Dec 05, 2013:  Angel [name] is doing well.  No angina.  Staying active.    January 09, 2015:  Doing well, no episodes of angina. He does all of his normal activities.  Still gardening .    February 04, 2016:  Still working,   Still has his garden . No CP , Breathing is stable   October 03, 2016:  Angel [name] is seen today for follow up visit .  Just getting over the flu and pneumonia. Spent 10 days in hospital.   He has a paralized left hemi - diaphragm and feels about like his normal state.   Saw pulmonary a month ago  - O2 sats were 96% at that time .   No angina .   Back doing his normal activities    January 05, 2017:  Angel [name] is seen today. No cardiac issues  BP is a little low, No dizziness.   July 07, 2017:  Angel [name] is seen today for follow up visit of his CAd Remaining active.  No Angina   February 15, 2018 :  Angel [name] is seen today for follow up visit,  Seen with daughter, Angel [name].  For his CAD and hyperlipidemia. Doing his normal activitiies. Breathing is ok  BP has been on the low side. His Primary md has tried holding his ramipril but his BP went too high   August 16, 2018:  Angel [name] is seen today for follow-up of his coronary artery disease.  Also has a history of  hyperlipidemia.  Denies any chest pain or shortness of breath.  Exercises regularly.  Still does all that he wants to do.  Sept. 14, 2020 Angel [name] is seen today for follow up of his CAD and hyperlipidemia.  He denies any angina.  He has a paralyzed hemidiaphragm which limits his breathing on occasion. No limitations    Current Outpatient Medications on File Prior to Visit  Medication Sig Dispense Refill  . acetaminophen (TYLENOL) 500 MG tablet Take 1,000 mg by mouth 2 (two) times daily.    Marland Kitchen albuterol (PROVENTIL HFA;VENTOLIN HFA) 108 (90 Base) MCG/ACT inhaler Inhale 1-2 puffs into the lungs every 4 (four) hours as needed for wheezing or shortness of breath.    . clopidogrel (PLAVIX) 75 MG tablet TAKE 1 TABLET(75 MG) BY MOUTH AT BEDTIME 90 tablet 1  . doxycycline (VIBRA-TABS) 100 MG tablet Take 1 tablet (100 mg total) by mouth 2 (two) times daily. 3 tablet 0  . furosemide (LASIX) 20 MG tablet Take 20 mg by mouth at bedtime.    Marland Kitchen glipiZIDE-metformin (METAGLIP) 5-500 MG  tablet Take 2 tablets by mouth 2 (two) times daily before a meal.     . Methylcobalamin (B-12) 5000 MCG TBDP Take 5,000 mcg by mouth at bedtime.    . nitroGLYCERIN (NITROSTAT) 0.4 MG SL tablet Place 0.4 mg under the tongue every 5 (five) minutes as needed for chest pain.    . Omega-3 Fatty Acids (FISH OIL) 1200 MG CAPS Take 1,200 mg by mouth at bedtime.    . ramipril (ALTACE) 2.5 MG capsule Take 2.5 mg by mouth at bedtime.    . rosuvastatin (CRESTOR) 20 MG tablet Take 20 mg by mouth at bedtime.    . tamsulosin (FLOMAX) 0.4 MG CAPS capsule Take 1 capsule (0.4 mg total) by mouth daily. 30 capsule 0   No current facility-administered medications on file prior to visit.     No Known Allergies  Past Medical History:  Diagnosis Date  . Arthritis   . Benign prostatic hypertrophy   . BPH (benign prostatic hyperplasia)   . COPD (chronic obstructive pulmonary disease) ([facility])   . Coronary artery disease    status post PTCA and stenting  of the left anterior descending artery  . Diabetes mellitus   . Dyspnea    with exertion   . Hyperlipidemia   . Hypertension   . Paralyzed hemidiaphragm    Left  . Pneumonia    50 years ago    Past Surgical History:  Procedure Laterality Date  . THULIUM LASER TURP (TRANSURETHRAL RESECTION OF PROSTATE) N/A 10/30/2016   Procedure: THULIUM LASER TURP (TRANSURETHRAL RESECTION OF PROSTATE);  Surgeon: Bjorn PippinJohn Wrenn, MD;  Location: [facility];  Service: Urology;  Laterality: N/A;  . TRANSURETHRAL RESECTION OF PROSTATE N/A 05/05/2017   Procedure: TRANSURETHRAL RESECTION OF THE PROSTATE (TURP);  Surgeon: Bjorn PippinWrenn, John, MD;  Location: [facility];  Service: Urology;  Laterality: N/A;  NEEDS 60 MIN FOR PROCEDURE    Social History   Tobacco Use  Smoking Status Never Smoker  Smokeless Tobacco Never Used    Social History   Substance and Sexual Activity  Alcohol Use No    Family History  Problem Relation Age of Onset  . Cancer Father   . Cancer Brother     Reviw of Systems:  Noted in current history  Physical Exam: Blood pressure 138/62, pulse 89, height 5\' 5"  (1.651 m), weight 143 lb 12.8 oz (65.2 kg), SpO2 94 %.  GEN:  Well nourished, well developed in no acute distress HEENT: Normal NECK: No JVD; No carotid bruits LYMPHATICS: No lymphadenopathy CARDIAC: RRR , no murmurs, rubs, gallops RESPIRATORY:  Clear to auscultation without rales, wheezing or rhonchi  ABDOMEN: Soft, non-tender, non-distended MUSCULOSKELETAL:  No edema; No deformity  SKIN: Warm and dry NEUROLOGIC:  Alert and oriented x 3   ECG:        Assessment / Plan:   1. Coronary artery disease-status post PTCA and stenting of the LAD -    No angina  Seems to be doing well  2. Paralyzed left hemidiaphragm -stable, no limitations   3. Diabetes mellitus - management   4. Hypertension  :BP is stable   5. Hyperlipidemia   .cont crestor.   Check labs today   7. Chronic diastolic CHF : seems stable       Kristeen MissPhilip  Caeden Foots, MD  04/04/2019 8:23 AM    [facility] [address],  [address] [place], [place]  [id number] Pager [phone]- [phone] Phone: [phone]; Fax: [phone]

## 2019-04-04 NOTE — Patient Instructions (Signed)
Medication Instructions:  Your physician has recommended you make the following change in your medication:  STOP Aspirin  If you need a refill on your cardiac medications before your next appointment, please call your pharmacy.    Lab work:TODAY - cholesterol, liver panel, basic metabolic panel  If you have labs (blood work) drawn today and your tests are completely normal, you will receive your results only by: . MyChart Message (if you have MyChart) OR . A paper copy in the mail If you have any lab test that is abnormal or we need to change your treatment, we will call you to review the results.   Testing/Procedures: None Ordered   Follow-Up: At [facility], you and your health needs are our priority.  As part of our continuing mission to provide you with exceptional heart care, we have created designated Provider Care Teams.  These Care Teams include your primary Cardiologist (physician) and Advanced Practice Providers (APPs -  Physician Assistants and Nurse Practitioners) who all work together to provide you with the care you need, when you need it. You will need a follow up appointment in:  6 months.  Please call our office 2 months in advance to schedule this appointment.  You may see Philip Nahser, MD or one of the following Advanced Practice Providers on your designated Care Team: Scott Weaver, PA-C Vin Bhagat, PA-C . Janine Hammond, NP  

## 2019-04-25 DIAGNOSIS — S46011A Strain of muscle(s) and tendon(s) of the rotator cuff of right shoulder, initial encounter: Secondary | ICD-10-CM | POA: Diagnosis not present

## 2019-05-09 ENCOUNTER — Other Ambulatory Visit: Payer: Self-pay | Admitting: Cardiovascular Disease

## 2019-05-09 DIAGNOSIS — N182 Chronic kidney disease, stage 2 (mild): Secondary | ICD-10-CM | POA: Diagnosis not present

## 2019-05-09 MED ORDER — CLOPIDOGREL BISULFATE 75 MG PO TABS: ORAL_TABLET | ORAL | 3 refills | Status: DC

## 2019-05-15 DIAGNOSIS — J441 Chronic obstructive pulmonary disease with (acute) exacerbation: Secondary | ICD-10-CM | POA: Diagnosis not present

## 2019-05-16 DIAGNOSIS — N182 Chronic kidney disease, stage 2 (mild): Secondary | ICD-10-CM | POA: Diagnosis not present

## 2019-05-16 DIAGNOSIS — E1122 Type 2 diabetes mellitus with diabetic chronic kidney disease: Secondary | ICD-10-CM | POA: Diagnosis not present

## 2019-05-16 DIAGNOSIS — E875 Hyperkalemia: Secondary | ICD-10-CM | POA: Diagnosis not present

## 2019-05-16 DIAGNOSIS — I129 Hypertensive chronic kidney disease with stage 1 through stage 4 chronic kidney disease, or unspecified chronic kidney disease: Secondary | ICD-10-CM | POA: Diagnosis not present

## 2019-06-06 DIAGNOSIS — I10 Essential (primary) hypertension: Secondary | ICD-10-CM | POA: Diagnosis not present

## 2019-06-06 DIAGNOSIS — I5032 Chronic diastolic (congestive) heart failure: Secondary | ICD-10-CM | POA: Diagnosis not present

## 2019-06-06 DIAGNOSIS — E1151 Type 2 diabetes mellitus with diabetic peripheral angiopathy without gangrene: Secondary | ICD-10-CM | POA: Diagnosis not present

## 2019-06-06 DIAGNOSIS — E782 Mixed hyperlipidemia: Secondary | ICD-10-CM | POA: Diagnosis not present

## 2019-06-06 DIAGNOSIS — Z23 Encounter for immunization: Secondary | ICD-10-CM | POA: Diagnosis not present

## 2019-06-06 DIAGNOSIS — J449 Chronic obstructive pulmonary disease, unspecified: Secondary | ICD-10-CM | POA: Diagnosis not present

## 2019-06-15 DIAGNOSIS — J441 Chronic obstructive pulmonary disease with (acute) exacerbation: Secondary | ICD-10-CM | POA: Diagnosis not present

## 2019-07-04 DIAGNOSIS — I10 Essential (primary) hypertension: Secondary | ICD-10-CM | POA: Diagnosis not present

## 2019-07-04 DIAGNOSIS — E1151 Type 2 diabetes mellitus with diabetic peripheral angiopathy without gangrene: Secondary | ICD-10-CM | POA: Diagnosis not present

## 2019-07-04 DIAGNOSIS — I5032 Chronic diastolic (congestive) heart failure: Secondary | ICD-10-CM | POA: Diagnosis not present

## 2019-07-04 DIAGNOSIS — I2510 Atherosclerotic heart disease of native coronary artery without angina pectoris: Secondary | ICD-10-CM | POA: Diagnosis not present

## 2019-07-04 DIAGNOSIS — E782 Mixed hyperlipidemia: Secondary | ICD-10-CM | POA: Diagnosis not present

## 2019-07-15 DIAGNOSIS — J441 Chronic obstructive pulmonary disease with (acute) exacerbation: Secondary | ICD-10-CM | POA: Diagnosis not present

## 2019-07-25 DIAGNOSIS — M19011 Primary osteoarthritis, right shoulder: Secondary | ICD-10-CM | POA: Diagnosis not present

## 2019-08-10 ENCOUNTER — Ambulatory Visit: Payer: Medicare Other | Attending: Internal Medicine

## 2019-08-10 DIAGNOSIS — Z23 Encounter for immunization: Secondary | ICD-10-CM | POA: Insufficient documentation

## 2019-08-10 NOTE — Progress Notes (Signed)
   Covid-19 Vaccination Clinic  Name:  ANTHON HARPOLE    MRN: [id number] DOB: 06/16/1936  08/10/2019  Mr. Vasil was observed post Covid-19 immunization for 15 minutes without incidence. He was provided with Vaccine Information Sheet and instruction to access the V-Safe system.   Mr. Verrette was instructed to call 911 with any severe reactions post vaccine: Marland Kitchen Difficulty breathing  . Swelling of your face and throat  . A fast heartbeat  . A bad rash all over your body  . Dizziness and weakness    Immunizations Administered    Name Date Dose VIS Date Route   Pfizer COVID-19 Vaccine 08/10/2019  8:37 AM 0.3 mL 07/01/2019 Intramuscular   Manufacturer: [employer], [employer]   Lot: [id number]   NDC: [address]-0

## 2019-08-15 DIAGNOSIS — J441 Chronic obstructive pulmonary disease with (acute) exacerbation: Secondary | ICD-10-CM | POA: Diagnosis not present

## 2019-08-29 ENCOUNTER — Ambulatory Visit: Payer: Medicare Other | Attending: Internal Medicine

## 2019-08-29 DIAGNOSIS — Z23 Encounter for immunization: Secondary | ICD-10-CM

## 2019-08-29 NOTE — Progress Notes (Signed)
   Covid-19 Vaccination Clinic  Name:  Angel [name]    MRN: [id number] DOB: [birth year]  08/29/2019  [name] was observed post Covid-19 immunization for 30 minutes based on pre-vaccination screening without incidence. He was provided with Vaccine Information Sheet and instruction to access the V-Safe system.   [name] was instructed to call 911 with any severe reactions post vaccine: Marland Kitchen Difficulty breathing  . Swelling of your face and throat  . A fast heartbeat  . A bad rash all over your body  . Dizziness and weakness    Immunizations Administered    Name Date Dose VIS Date Route   Pfizer COVID-19 Vaccine 08/29/2019  6:14 PM 0.3 mL 07/01/2019 Intramuscular   Manufacturer: [employer], [employer]   Lot: [id number]   NDC: [address]-5

## 2019-08-30 ENCOUNTER — Telehealth: Payer: Self-pay | Admitting: Cardiovascular Disease

## 2019-08-30 NOTE — Telephone Encounter (Signed)
New Message   Pt c/o medication issue:  1. Name of Medication: aspirin   2. How are you currently taking this medication (dosage and times per day)?   3. Are you having a reaction (difficulty breathing--STAT)?   4. What is your medication issue? Patient is wanting to confirm whether or not he should still be taken aspirin. Please advise

## 2019-08-30 NOTE — Telephone Encounter (Signed)
The phone number and name associated with this call is not listed on patient's demographic information or DPR. I left a message identifying myself and asked for a return call to our office.  I called and left a message for the patient to call back so that I can clarify if he has a question about his aspirin.

## 2019-09-01 NOTE — Telephone Encounter (Signed)
Received call from patient and his daughter, Bronson Ing, whom patient states we have permission to speak with. I advised that patient was advised on 04/04/19 to d/c ASA and continue clopidogrel. I asked if there were any further questions and they both deny. I thanked the patient for calling to give me permission to speak with his daughter and they both thanked me for the call.

## 2019-09-02 DIAGNOSIS — N3001 Acute cystitis with hematuria: Secondary | ICD-10-CM | POA: Diagnosis not present

## 2019-09-05 ENCOUNTER — Encounter (HOSPITAL_COMMUNITY): Payer: Self-pay | Admitting: Emergency Medicine

## 2019-09-05 ENCOUNTER — Inpatient Hospital Stay (HOSPITAL_COMMUNITY)
Admission: EM | Admit: 2019-09-05 | Discharge: 2019-09-10 | DRG: 696 | Disposition: A | Discharge status: Home / Self Care | Payer: Medicare Other | Attending: Internal Medicine | Admitting: Internal Medicine

## 2019-09-05 ENCOUNTER — Other Ambulatory Visit: Payer: Self-pay

## 2019-09-05 ENCOUNTER — Emergency Department (HOSPITAL_COMMUNITY)
Admission: EM | Admit: 2019-09-05 | Discharge: 2019-09-05 | Disposition: A | Discharge status: Home / Self Care | Payer: Medicare Other | Source: Home / Self Care | Attending: Emergency Medicine | Admitting: Emergency Medicine

## 2019-09-05 DIAGNOSIS — I110 Hypertensive heart disease with heart failure: Secondary | ICD-10-CM | POA: Diagnosis present

## 2019-09-05 DIAGNOSIS — Z9079 Acquired absence of other genital organ(s): Secondary | ICD-10-CM

## 2019-09-05 DIAGNOSIS — N401 Enlarged prostate with lower urinary tract symptoms: Secondary | ICD-10-CM | POA: Diagnosis present

## 2019-09-05 DIAGNOSIS — Z7901 Long term (current) use of anticoagulants: Secondary | ICD-10-CM | POA: Diagnosis not present

## 2019-09-05 DIAGNOSIS — E162 Hypoglycemia, unspecified: Secondary | ICD-10-CM

## 2019-09-05 DIAGNOSIS — Z66 Do not resuscitate: Secondary | ICD-10-CM | POA: Diagnosis not present

## 2019-09-05 DIAGNOSIS — D649 Anemia, unspecified: Secondary | ICD-10-CM

## 2019-09-05 DIAGNOSIS — E1169 Type 2 diabetes mellitus with other specified complication: Secondary | ICD-10-CM

## 2019-09-05 DIAGNOSIS — R338 Other retention of urine: Secondary | ICD-10-CM | POA: Diagnosis not present

## 2019-09-05 DIAGNOSIS — N3289 Other specified disorders of bladder: Secondary | ICD-10-CM | POA: Diagnosis present

## 2019-09-05 DIAGNOSIS — Z20822 Contact with and (suspected) exposure to covid-19: Secondary | ICD-10-CM | POA: Diagnosis present

## 2019-09-05 DIAGNOSIS — E1165 Type 2 diabetes mellitus with hyperglycemia: Secondary | ICD-10-CM | POA: Insufficient documentation

## 2019-09-05 DIAGNOSIS — K8020 Calculus of gallbladder without cholecystitis without obstruction: Secondary | ICD-10-CM | POA: Diagnosis not present

## 2019-09-05 DIAGNOSIS — E119 Type 2 diabetes mellitus without complications: Secondary | ICD-10-CM | POA: Diagnosis not present

## 2019-09-05 DIAGNOSIS — E11649 Type 2 diabetes mellitus with hypoglycemia without coma: Secondary | ICD-10-CM | POA: Diagnosis present

## 2019-09-05 DIAGNOSIS — J449 Chronic obstructive pulmonary disease, unspecified: Secondary | ICD-10-CM

## 2019-09-05 DIAGNOSIS — Z955 Presence of coronary angioplasty implant and graft: Secondary | ICD-10-CM

## 2019-09-05 DIAGNOSIS — J441 Chronic obstructive pulmonary disease with (acute) exacerbation: Secondary | ICD-10-CM | POA: Diagnosis present

## 2019-09-05 DIAGNOSIS — I5032 Chronic diastolic (congestive) heart failure: Secondary | ICD-10-CM | POA: Diagnosis present

## 2019-09-05 DIAGNOSIS — R319 Hematuria, unspecified: Secondary | ICD-10-CM

## 2019-09-05 DIAGNOSIS — Z03818 Encounter for observation for suspected exposure to other biological agents ruled out: Secondary | ICD-10-CM | POA: Diagnosis not present

## 2019-09-05 DIAGNOSIS — Z7984 Long term (current) use of oral hypoglycemic drugs: Secondary | ICD-10-CM | POA: Insufficient documentation

## 2019-09-05 DIAGNOSIS — D62 Acute posthemorrhagic anemia: Secondary | ICD-10-CM | POA: Diagnosis present

## 2019-09-05 DIAGNOSIS — Z79899 Other long term (current) drug therapy: Secondary | ICD-10-CM | POA: Insufficient documentation

## 2019-09-05 DIAGNOSIS — I2510 Atherosclerotic heart disease of native coronary artery without angina pectoris: Secondary | ICD-10-CM | POA: Insufficient documentation

## 2019-09-05 DIAGNOSIS — Z7902 Long term (current) use of antithrombotics/antiplatelets: Secondary | ICD-10-CM | POA: Diagnosis not present

## 2019-09-05 DIAGNOSIS — R310 Gross hematuria: Secondary | ICD-10-CM

## 2019-09-05 DIAGNOSIS — Z7982 Long term (current) use of aspirin: Secondary | ICD-10-CM

## 2019-09-05 DIAGNOSIS — E785 Hyperlipidemia, unspecified: Secondary | ICD-10-CM | POA: Diagnosis present

## 2019-09-05 DIAGNOSIS — R339 Retention of urine, unspecified: Secondary | ICD-10-CM

## 2019-09-05 DIAGNOSIS — R1030 Lower abdominal pain, unspecified: Secondary | ICD-10-CM | POA: Diagnosis not present

## 2019-09-05 HISTORY — DX: Hematuria, unspecified: R31.9

## 2019-09-05 LAB — HEMOGLOBIN AND HEMATOCRIT, BLOOD
HCT: 25.8 % — ABNORMAL LOW (ref 39.0–52.0)
Hemoglobin: 8.2 g/dL — ABNORMAL LOW (ref 13.0–17.0)

## 2019-09-05 LAB — CBC
HCT: 26.4 % — ABNORMAL LOW (ref 39.0–52.0)
HCT: 24 % — ABNORMAL LOW (ref 39.0–52.0)
Hemoglobin: 8.3 g/dL — ABNORMAL LOW (ref 13.0–17.0)
Hemoglobin: 7.6 g/dL — ABNORMAL LOW (ref 13.0–17.0)
MCH: 30.1 pg (ref 26.0–34.0)
MCH: 30.3 pg (ref 26.0–34.0)
MCHC: 31.4 g/dL (ref 30.0–36.0)
MCHC: 31.7 g/dL (ref 30.0–36.0)
MCV: 95.7 fL (ref 80.0–100.0)
MCV: 95.6 fL (ref 80.0–100.0)
Platelets: 293 10*3/uL (ref 150–400)
Platelets: 304 10*3/uL (ref 150–400)
RBC: 2.76 MIL/uL — ABNORMAL LOW (ref 4.22–5.81)
RBC: 2.51 MIL/uL — ABNORMAL LOW (ref 4.22–5.81)
RDW: 14 % (ref 11.5–15.5)
RDW: 14.3 % (ref 11.5–15.5)
WBC: 12.8 10*3/uL — ABNORMAL HIGH (ref 4.0–10.5)
WBC: 16 10*3/uL — ABNORMAL HIGH (ref 4.0–10.5)
nRBC: 0 % (ref 0.0–0.2)
nRBC: 0 % (ref 0.0–0.2)

## 2019-09-05 LAB — BASIC METABOLIC PANEL
Anion gap: 9 (ref 5–15)
BUN: 25 mg/dL — ABNORMAL HIGH (ref 8–23)
CO2: 28 mmol/L (ref 22–32)
Calcium: 9.4 mg/dL (ref 8.9–10.3)
Chloride: 102 mmol/L (ref 98–111)
Creatinine, Ser: 1.02 mg/dL (ref 0.61–1.24)
GFR calc Af Amer: >60 mL/min (ref >60)
GFR calc non Af Amer: >60 mL/min (ref >60)
Glucose, Bld: 63 mg/dL — ABNORMAL LOW (ref 70–99)
Potassium: 4.1 mmol/L (ref 3.5–5.1)
Sodium: 139 mmol/L (ref 135–145)

## 2019-09-05 LAB — CBG MONITORING, ED
Glucose-Capillary: 69 mg/dL — ABNORMAL LOW (ref 70–99)
Glucose-Capillary: 108 mg/dL — ABNORMAL HIGH (ref 70–99)

## 2019-09-05 LAB — PREPARE RBC (CROSSMATCH)

## 2019-09-05 LAB — ABO/RH: ABO/RH(D): O POS

## 2019-09-05 MED ORDER — MORPHINE SULFATE (PF) 2 MG/ML IV SOLN
0.5000 mg | INTRAVENOUS | Status: DC | PRN
  Administered 2019-09-06: 0.5 mg via INTRAVENOUS
  Filled 2019-09-05 (×2): qty 1

## 2019-09-05 MED ORDER — OXYCODONE HCL 5 MG PO TABS
5.0000 mg | ORAL_TABLET | Freq: Once | ORAL | Status: AC
  Administered 2019-09-05: 5 mg via ORAL
  Filled 2019-09-05: qty 1

## 2019-09-05 MED ORDER — SODIUM CHLORIDE 0.9 % IV SOLN
1.0000 g | Freq: Every day | INTRAVENOUS | Status: DC
  Administered 2019-09-05 – 2019-09-06 (×2): 1 g via INTRAVENOUS
  Filled 2019-09-05: qty 10
  Filled 2019-09-05: qty 1
  Filled 2019-09-05: qty 10

## 2019-09-05 MED ORDER — LIDOCAINE HCL URETHRAL/MUCOSAL 2 % EX GEL
1.0000 "application " | Freq: Once | CUTANEOUS | Status: AC
  Administered 2019-09-05: 1 via URETHRAL
  Filled 2019-09-05: qty 30

## 2019-09-05 MED ORDER — SODIUM CHLORIDE 0.9 % IR SOLN
3000.0000 mL | Status: DC
  Administered 2019-09-05: 3000 mL

## 2019-09-05 MED ORDER — OXYBUTYNIN CHLORIDE 5 MG PO TABS
5.0000 mg | ORAL_TABLET | Freq: Once | ORAL | Status: AC
  Administered 2019-09-05: 5 mg via ORAL
  Filled 2019-09-05: qty 1

## 2019-09-05 MED ORDER — SODIUM CHLORIDE 0.9% IV SOLUTION: Freq: Once | INTRAVENOUS | Status: AC

## 2019-09-05 NOTE — ED Provider Notes (Signed)
[facility] DEPT Provider Note   CSN: [id number] Arrival date & time: 09/05/19  1941     History Chief Complaint  Patient presents with  . Cathether Issue    Angel [name] is a 84 y.o. male.  84 yo M with a chief complaints of painful urination.  Patient was seen here early this morning and found to have acute urinary retention had a Foley catheter placed and had a significant amount of hematuria that ended up clearing with irrigation.  Patient states that since then the urine is gotten much more bloody.  Is also gotten much more painful.  Having some pain to the suprapubic region as well.  Is asking me if he can have a pain pill for this.  No fevers no flank pain.  The history is provided by the patient.  Illness Severity:  Moderate Onset quality:  Gradual Duration:  1 day Timing:  Constant Progression:  Worsening Chronicity:  New Associated symptoms: abdominal pain   Associated symptoms: no chest pain, no congestion, no diarrhea, no fever, no headaches, no myalgias, no rash, no shortness of breath and no vomiting        Past Medical History:  Diagnosis Date  . Arthritis   . Benign prostatic hypertrophy   . BPH (benign prostatic hyperplasia)   . COPD (chronic obstructive pulmonary disease) ([place])   . Coronary artery disease    status post PTCA and stenting of the left anterior descending artery  . Diabetes mellitus   . Dyspnea    with exertion   . Hyperlipidemia   . Hypertension   . Paralyzed hemidiaphragm    Left  . Pneumonia    50 years ago    Patient Active Problem List   Diagnosis Date Noted  . Acute exacerbation of CHF (congestive heart failure) ([place]) 09/11/2018  . Hypokalemia 09/11/2018  . COPD with acute exacerbation ([place]) 09/10/2018  . BPH with obstruction/lower urinary tract symptoms 05/05/2017  . Nonrheumatic aortic valve stenosis 10/03/2016  . Chronic diastolic CHF (congestive heart failure) ([place]) 10/03/2016  . Acute  bronchitis   . Diaphragm paralysis   . Acute encephalopathy   . Influenza with pneumonia   . Acute urinary retention   . Chest pain on breathing   . Acute on chronic respiratory failure with hypoxemia ([place])   . Acute respiratory failure with hypoxia ([place]) 08/12/2016  . Influenza 08/12/2016  . Hypertensive heart disease without heart failure   . Demand ischemia ([place])   . S/P coronary artery stent placement   . COPD exacerbation ([place]) 08/11/2016  . Diabetes mellitus ([place]) 10/06/2011  . HTN (hypertension) 10/06/2011  . Hyperlipidemia 10/06/2011  . CAD (coronary artery disease) 04/07/2011    Past Surgical History:  Procedure Laterality Date  . THULIUM LASER TURP (TRANSURETHRAL RESECTION OF PROSTATE) N/A 10/30/2016   Procedure: THULIUM LASER TURP (TRANSURETHRAL RESECTION OF PROSTATE);  Surgeon: Irine Seal, MD;  Location: [facility];  Service: Urology;  Laterality: N/A;  . TRANSURETHRAL RESECTION OF PROSTATE N/A 05/05/2017   Procedure: TRANSURETHRAL RESECTION OF THE PROSTATE (TURP);  Surgeon: Irine Seal, MD;  Location: [facility];  Service: Urology;  Laterality: N/A;  NEEDS 60 MIN FOR PROCEDURE       Family History  Problem Relation Age of Onset  . Cancer Father   . Cancer Brother     Social History   Tobacco Use  . Smoking status: Never Smoker  . Smokeless tobacco: Never Used  Substance Use Topics  .  Alcohol use: No  . Drug use: No    Home Medications Prior to Admission medications   Medication Sig Start Date End Date Taking? Authorizing Provider  acetaminophen (TYLENOL) 500 MG tablet Take 1,000 mg by mouth every 4 (four) hours as needed for mild pain or headache.     [provider]  albuterol (PROVENTIL HFA;VENTOLIN HFA) 108 (90 Base) MCG/ACT inhaler Inhale 1-2 puffs into the lungs every 4 (four) hours as needed for wheezing or shortness of breath.    [provider]  Ascorbic Acid (VITAMIN C PO) Take 1 tablet by mouth daily.    [provider]    aspirin 81 MG chewable tablet Chew 81 mg by mouth daily.    [provider]  ciprofloxacin (CIPRO) 250 MG tablet Take 250 mg by mouth 2 (two) times daily. 7 day supply 09/02/19   [provider]  clopidogrel (PLAVIX) 75 MG tablet TAKE 1 TABLET(75 MG) BY MOUTH AT BEDTIME Patient taking differently: Take 75 mg by mouth daily. TAKE 1 TABLET(75 MG) BY MOUTH AT BEDTIME 05/09/19   Nahser, Deloris Ping, MD  doxycycline (VIBRA-TABS) 100 MG tablet Take 1 tablet (100 mg total) by mouth 2 (two) times daily. Patient not taking: Reported on 09/05/2019 09/13/18   Elease Etienne, MD  furosemide (LASIX) 20 MG tablet Take 20 mg by mouth at bedtime.    [provider]  glipiZIDE-metformin (METAGLIP) 5-500 MG tablet Take 2 tablets by mouth 2 (two) times daily before a meal.     [provider]  Methylcobalamin (B-12) 5000 MCG TBDP Take 5,000 mcg by mouth at bedtime.    [provider]  nitroGLYCERIN (NITROSTAT) 0.4 MG SL tablet Place 0.4 mg under the tongue every 5 (five) minutes as needed for chest pain.    [provider]  Omega-3 Fatty Acids (FISH OIL) 1200 MG CAPS Take 1,200 mg by mouth at bedtime.    [provider]  ramipril (ALTACE) 2.5 MG capsule Take 2.5 mg by mouth at bedtime.    [provider]  rosuvastatin (CRESTOR) 20 MG tablet Take 20 mg by mouth at bedtime. 08/12/18   [provider]  tamsulosin (FLOMAX) 0.4 MG CAPS capsule Take 1 capsule (0.4 mg total) by mouth daily. Patient not taking: Reported on 09/05/2019 09/14/18   Elease Etienne, MD  VITAMIN D PO Take 1 capsule by mouth daily.    [provider]    Allergies    Patient has no known allergies.  Review of Systems   Review of Systems  Constitutional: Negative for chills and fever.  HENT: Negative for congestion and facial swelling.   Eyes: Negative for discharge and visual disturbance.  Respiratory: Negative for shortness of breath.   Cardiovascular:  Negative for chest pain and palpitations.  Gastrointestinal: Positive for abdominal pain. Negative for diarrhea and vomiting.  Genitourinary: Positive for dysuria.  Musculoskeletal: Negative for arthralgias and myalgias.  Skin: Negative for color change and rash.  Neurological: Negative for tremors, syncope and headaches.  Psychiatric/Behavioral: Negative for confusion and dysphoric mood.    Physical Exam Updated Vital Signs BP 101/61 (BP Location: Left Arm)   Pulse (!) 120   Temp 99.1 F (37.3 C) (Oral)   Resp 17   SpO2 94%   Physical Exam Vitals and nursing note reviewed.  Constitutional:      Appearance: He is well-developed.     Comments: Cachectic, pallor  HENT:     Head: Normocephalic and atraumatic.  Eyes:     Pupils: Pupils are equal, round, and reactive to light.  Neck:     Vascular: No JVD.  Cardiovascular:     Rate and Rhythm: Normal rate and regular rhythm.     Heart sounds: No murmur. No friction rub. No gallop.   Pulmonary:     Effort: No respiratory distress.     Breath sounds: No wheezing.  Abdominal:     General: There is no distension.     Tenderness: There is abdominal tenderness. There is no guarding or rebound.     Comments: Suprapubic tenderness  Musculoskeletal:        General: Normal range of motion.     Cervical back: Normal range of motion and neck supple.  Skin:    Coloration: Skin is not pale.     Findings: No rash.  Neurological:     Mental Status: He is alert and oriented to person, place, and time.  Psychiatric:        Behavior: Behavior normal.     ED Results / Procedures / Treatments   Labs (all labs ordered are listed, but only abnormal results are displayed) Labs Reviewed  CBC - Abnormal; Notable for the following components:      Result Value   WBC 16.0 (*)    RBC 2.51 (*)    Hemoglobin 7.6 (*)    HCT 24.0 (*)    All other components within normal limits  SARS CORONAVIRUS 2 (TAT 6-24 HRS)  PREPARE RBC (CROSSMATCH)     EKG None  Radiology No results found.  Procedures Procedures (including critical care time) Emergency Focused Ultrasound Exam Limited ultrasound of the Bladder.   Performed and interpreted by Dr. Adela Lank Indication: evaluation for urinary retention Transverse and Sagittal views of the bladder are obtained and calculations are performed to determine an estimated bladder volume for signs of post-renal obstruction.  Findings: Bladder is 147 cc full.  Foley bulb is located in the center of the bladder. Interpretation: no evidence of outlet obstruction Images archived electronically.  CPT Codes: 30092 and [phone]  Medications Ordered in ED Medications  lidocaine (XYLOCAINE) 2 % jelly 1 application (has no administration in time range)  0.9 %  sodium chloride infusion (Manually program via Guardrails IV Fluids) (has no administration in time range)  oxybutynin (DITROPAN) tablet 5 mg (5 mg Oral Given 09/05/19 2053)  oxyCODONE (Oxy IR/ROXICODONE) immediate release tablet 5 mg (5 mg Oral Given 09/05/19 2054)    ED Course  I have reviewed the triage vital signs and the nursing notes.  Pertinent labs & imaging results that were available during my care of the patient were reviewed by me and considered in my medical decision making (see chart for details).    MDM Rules/Calculators/A&P                      84 yo M with a chief complaint of dysuria after having a Foley catheter placed.  Bladder scanner with about 41 cc and patient does have actively draining urine into his Foley bag.  He ever appears fairly uncomfortable and so I performed a bedside ultrasound which showed about 150 cc in the bladder.  The Foley bulb was in the middle of the bladder.  Will give the patient a dose of oxybutynin at a dose of oxycodone will irrigate the bladder and reassess.  Nursing was having some difficulty irrigating felt that they were putting fluid in but not getting much  return, the stuff they did get  out was significant clot.  Patient's hemoglobin has returned and is about half a gram lower than it was earlier this morning.  I discussed the case with Dr. Annabell Howells, urology.  He recommended inserting a larger catheter and doing manual irrigation.  He did not recommend irrigating by bag through a three-way.  With this drop in hemoglobin he recommended discussion with the hospitalist for admission and he would evaluate the patient in the morning.  Asked to be reconsulted by them in the morning.  As the patient's hemoglobin is dropped to 7.6 we will give a unit of blood.  CRITICAL CARE Performed by: Rae Roam   Total critical care time: 35 minutes  Critical care time was exclusive of separately billable procedures and treating other patients.  Critical care was necessary to treat or prevent imminent or life-threatening deterioration.  Critical care was time spent personally by me on the following activities: development of treatment plan with patient and/or surrogate as well as nursing, discussions with consultants, evaluation of patient's response to treatment, examination of patient, obtaining history from patient or surrogate, ordering and performing treatments and interventions, ordering and review of laboratory studies, ordering and review of radiographic studies, pulse oximetry and re-evaluation of patient's condition.  The patients results and plan were reviewed and discussed.   Any x-rays performed were independently reviewed by myself.   Differential diagnosis were considered with the presenting HPI.  Medications  lidocaine (XYLOCAINE) 2 % jelly 1 application (has no administration in time range)  0.9 %  sodium chloride infusion (Manually program via Guardrails IV Fluids) (has no administration in time range)  oxybutynin (DITROPAN) tablet 5 mg (5 mg Oral Given 09/05/19 2053)  oxyCODONE (Oxy IR/ROXICODONE) immediate release tablet 5 mg (5 mg Oral Given 09/05/19 2054)     Vitals:   09/05/19 1959 09/05/19 2032  BP: 101/61   Pulse: (!) 120   Resp:  17  Temp: 99.1 F (37.3 C)   TempSrc: Oral   SpO2: 94%     Final diagnoses:  Gross hematuria    Admission/ observation were discussed with the admitting physician, patient and/or family and they are comfortable with the plan.   Final Clinical Impression(s) / ED Diagnoses Final diagnoses:  Gross hematuria    Rx / DC Orders ED Discharge Orders    None       Melene Plan, DO 09/05/19 2140

## 2019-09-05 NOTE — ED Notes (Signed)
Foley irrigated by Miachel Roux RN, Large amounts of large blood clots. Unable to get inital Foley to flow after irrigation. Floyd MD made aware and instructed to insert a larger 3 way catheter. 22 3-way cathter placed bloody urine return noted. New flow irrigated until urin flow noted. A total of of sterile water used to irrigate with a total of 700 fluid output. Current Bladder scan showed 20 Ml

## 2019-09-05 NOTE — ED Triage Notes (Addendum)
Patient complaining of having trouble urinating. Patient has a hx of prostatic problems. Patient started Cipro on 09/02/2019 for uti.

## 2019-09-05 NOTE — ED Notes (Signed)
EDP at bedside  

## 2019-09-05 NOTE — ED Provider Notes (Signed)
Dr Preston Fleeting had signed out that pt to be d/c to home when rn has irrigated bladder.   Urine/irrigation flowing into foley bag / pink. No abd pain. bp normal, hr normal. Afebrile. Po fluids/food provided. cbg normal.  Close f/u w urology. Return precautions provided.      Cathren Laine, MD 09/05/19 1036

## 2019-09-05 NOTE — ED Notes (Signed)
Hospitalst paged for new type and screen per blood bank due to pt being discharged and readmitted and no blood bank armband

## 2019-09-05 NOTE — Discharge Instructions (Addendum)
It was our pleasure to provide your ER care today - we hope that you feel better.  Empty leg bag as need. Follow up with urologist in the next couple days - call office today to arrange appointment.   Complete the course of your antibiotic.   Make sure to eat meals regularly, and do not skip or delay meals when taking diabetic medication. If you begin to feel sugar is low, eat or drink something immediately and check sugar.   Return to ER if worse, new symptoms, fevers, weak/faint, new or severe pain, catheter not working, or other concern.

## 2019-09-05 NOTE — ED Notes (Signed)
Patient given orange juice. Will recheck sugar in 30 minutes.

## 2019-09-05 NOTE — ED Notes (Signed)
Daughter states that he started bleeding 08/27/2019. She states they saw blood in his urine. They did not physically see it.

## 2019-09-05 NOTE — H&P (Signed)
History and Physical    Angel [name] FAO:[id number] DOB: 1936/04/27 DOA: 09/05/2019  PCP: Jackie Plum, MD  Patient coming from: home, lives alone (wife passed away about 2 years ago) has 2 daughters that take care of him  I have personally briefly reviewed patient's old medical records in [facility]  Chief Complaint: Dysuria and hematuria  HPI: Angel [name] is a 84 y.o. male with medical history significant for CAD s/p PE stent, diastolic congestive heart failure, COPD, paralyzed left hemidiaphragm, type 2 diabetes, and hypertension who presents with worsening lower abdominal pain and hematuria.  For the past 3 days he has noted lower abdominal pain and burning with urination. Also noted hematuria with clots.  He presented to the ED earlier this morning and had Foley placement. Bladder was irrigated and after showing clearing he was discharged home with urology follow up.  However, after returning home he again had worsening burning pain with urination and he decided to present back to the ER. He has history of transurethral resection of the prostate in 2018.  He denies any tobacco, alcohol illicit drug use.  Denies any fever.  No nausea, vomiting or diarrhea.  No changes in appetite.  On arrival, he had a temperature of 99.1, tachycardic but normotensive on room air.  Worsening leukocytosis up to 16 from 12.8 earlier today.  Hemoglobin of 7.6 down from 8.3 this morning.   He was given oxycodone and oxybutynin in the ED and a unit of pRBC was ordered.    Review of Systems: Constitutional: No Weight Change, No Fever ENT/Mouth: No sore throat, No Rhinorrhea Eyes: No Eye Pain, No Vision Changes Cardiovascular: No Chest Pain, no SOB Respiratory: No Cough, No Sputum Gastrointestinal: No Nausea, No Vomiting, No Diarrhea, No Constipation, + Pain Genitourinary: no Urinary Incontinence Musculoskeletal: No Arthralgias, No Myalgias Skin: No Skin Lesions, No Pruritus, Neuro: no  Weakness, No Numbness Psych: No Anxiety/Panic, No Depression, no decrease appetite Heme/Lymph: No Bruising, No Bleeding  Past Medical History:  Diagnosis Date  . Arthritis   . Benign prostatic hypertrophy   . BPH (benign prostatic hyperplasia)   . COPD (chronic obstructive pulmonary disease) (HCC)   . Coronary artery disease    status post PTCA and stenting of the left anterior descending artery  . Diabetes mellitus   . Dyspnea    with exertion   . Hyperlipidemia   . Hypertension   . Paralyzed hemidiaphragm    Left  . Pneumonia    50 years ago    Past Surgical History:  Procedure Laterality Date  . THULIUM LASER TURP (TRANSURETHRAL RESECTION OF PROSTATE) N/A 10/30/2016   Procedure: THULIUM LASER TURP (TRANSURETHRAL RESECTION OF PROSTATE);  Surgeon: Bjorn Pippin, MD;  Location: [facility];  Service: Urology;  Laterality: N/A;  . TRANSURETHRAL RESECTION OF PROSTATE N/A 05/05/2017   Procedure: TRANSURETHRAL RESECTION OF THE PROSTATE (TURP);  Surgeon: Bjorn Pippin, MD;  Location: [facility];  Service: Urology;  Laterality: N/A;  NEEDS 60 MIN FOR PROCEDURE     reports that he has never smoked. He has never used smokeless tobacco. He reports that he does not drink alcohol or use drugs.  No Known Allergies  Family History  Problem Relation Age of Onset  . Cancer Father   . Cancer Brother      Prior to Admission medications   Medication Sig Start Date End Date Taking? Authorizing Provider  acetaminophen (TYLENOL) 500 MG tablet Take 1,000 mg by mouth every 4 (four) hours  as needed for mild pain or headache.     [provider]  albuterol (PROVENTIL HFA;VENTOLIN HFA) 108 (90 Base) MCG/ACT inhaler Inhale 1-2 puffs into the lungs every 4 (four) hours as needed for wheezing or shortness of breath.    [provider]  Ascorbic Acid (VITAMIN C PO) Take 1 tablet by mouth daily.    [provider]  aspirin 81 MG chewable tablet Chew 81 mg by mouth daily.    [provider]  ciprofloxacin (CIPRO) 250 MG tablet Take 250 mg by mouth 2 (two) times daily. 7 day supply 09/02/19   [provider]  clopidogrel (PLAVIX) 75 MG tablet TAKE 1 TABLET(75 MG) BY MOUTH AT BEDTIME Patient taking differently: Take 75 mg by mouth daily. TAKE 1 TABLET(75 MG) BY MOUTH AT BEDTIME 05/09/19   Nahser, Wonda Cheng, MD  doxycycline (VIBRA-TABS) 100 MG tablet Take 1 tablet (100 mg total) by mouth 2 (two) times daily. Patient not taking: Reported on 09/05/2019 09/13/18   Modena Jansky, MD  furosemide (LASIX) 20 MG tablet Take 20 mg by mouth at bedtime.    [provider]  glipiZIDE-metformin (METAGLIP) 5-500 MG tablet Take 2 tablets by mouth 2 (two) times daily before a meal.     [provider]  Methylcobalamin (B-12) 5000 MCG TBDP Take 5,000 mcg by mouth at bedtime.    [provider]  nitroGLYCERIN (NITROSTAT) 0.4 MG SL tablet Place 0.4 mg under the tongue every 5 (five) minutes as needed for chest pain.    [provider]  Omega-3 Fatty Acids (FISH OIL) 1200 MG CAPS Take 1,200 mg by mouth at bedtime.    [provider]  ramipril (ALTACE) 2.5 MG capsule Take 2.5 mg by mouth at bedtime.    [provider]  rosuvastatin (CRESTOR) 20 MG tablet Take 20 mg by mouth at bedtime. 08/12/18   [provider]  tamsulosin (FLOMAX) 0.4 MG CAPS capsule Take 1 capsule (0.4 mg total) by mouth daily. Patient not taking: Reported on 09/05/2019 09/14/18   Modena Jansky, MD  VITAMIN D PO Take 1 capsule by mouth daily.    [provider]    Physical Exam: Vitals:   09/05/19 2032 09/05/19 2102 09/05/19 2212 09/05/19 2236  BP:  119/62 113/66 112/70  Pulse:  (!) 111 (!) 103   Resp: 17  20 (!) 21  Temp:      TempSrc:      SpO2:  99% 99%     Constitutional: NAD, calm, comfortable, thin elderly male laying at about 20 degree incline in bed. Vitals:   09/05/19 2032 09/05/19 2102 09/05/19 2212 09/05/19 2236  BP:   119/62 113/66 112/70  Pulse:  (!) 111 (!) 103   Resp: 17  20 (!) 21  Temp:      TempSrc:      SpO2:  99% 99%    Eyes: PERRL, lids and conjunctivae normal ENMT: Mucous membranes are moist.  Neck: normal, supple Respiratory: clear to auscultation bilaterally, no wheezing, no crackles. Normal respiratory effort on room air. No accessory muscle use.  Cardiovascular: Regular rate and rhythm, no murmurs / rubs / gallops. No extremity edema. 2+ pedal pulses.  Abdomen: Mild tenderness to left lower quadrant near the left inguinal area, no rebound tenderness, no guarding or rigidity, no masses palpated. Bowel sounds positive.  Musculoskeletal: no clubbing / cyanosis. No joint deformity upper and lower extremities. Good ROM, no contractures. Normal muscle tone.  Skin:  no rashes, lesions, ulcers. No induration Neurologic: CN 2-12 grossly intact. Sensation intact. Strength 5/5 in all 4.  Psychiatric: Normal judgment and insight. Alert and oriented x 3. Normal mood although tearful when discussing deceased wife.     Labs on Admission: I have personally reviewed following labs and imaging studies  CBC: Recent Labs  Lab 09/05/19 0409 09/05/19 0623 09/05/19 2100  WBC 12.8*  --  16.0*  HGB 8.3* 8.2* 7.6*  HCT 26.4* 25.8* 24.0*  MCV 95.7  --  95.6  PLT 293  --  304   Basic Metabolic Panel: Recent Labs  Lab 09/05/19 0409  NA 139  K 4.1  CL 102  CO2 28  GLUCOSE 63*  BUN 25*  CREATININE 1.02  CALCIUM 9.4   GFR: Estimated Creatinine Clearance: 49.4 mL/min (by C-G formula based on SCr of 1.02 mg/dL). Liver Function Tests: No results for input(s): AST, ALT, ALKPHOS, BILITOT, PROT, ALBUMIN in the last 168 hours. No results for input(s): LIPASE, AMYLASE in the last 168 hours. No results for input(s): AMMONIA in the last 168 hours. Coagulation Profile: No results for input(s): INR, PROTIME in the last 168 hours. Cardiac Enzymes: No results for input(s): CKTOTAL, CKMB, CKMBINDEX,  TROPONINI in the last 168 hours. BNP (last 3 results) No results for input(s): PROBNP in the last 8760 hours. HbA1C: No results for input(s): HGBA1C in the last 72 hours. CBG: Recent Labs  Lab 09/05/19 0616 09/05/19 0959  GLUCAP 69* 108*   Lipid Profile: No results for input(s): CHOL, HDL, LDLCALC, TRIG, CHOLHDL, LDLDIRECT in the last 72 hours. Thyroid Function Tests: No results for input(s): TSH, T4TOTAL, FREET4, T3FREE, THYROIDAB in the last 72 hours. Anemia Panel: No results for input(s): VITAMINB12, FOLATE, FERRITIN, TIBC, IRON, RETICCTPCT in the last 72 hours. Urine analysis:    Component Value Date/Time   COLORURINE YELLOW 09/11/2018 0530   APPEARANCEUR CLEAR 09/11/2018 0530   LABSPEC 1.010 09/11/2018 0530   PHURINE 5.0 09/11/2018 0530   GLUCOSEU >=500 (A) 09/11/2018 0530   HGBUR NEGATIVE 09/11/2018 0530   BILIRUBINUR NEGATIVE 09/11/2018 0530   KETONESUR NEGATIVE 09/11/2018 0530   PROTEINUR 30 (A) 09/11/2018 0530   NITRITE NEGATIVE 09/11/2018 0530   LEUKOCYTESUR NEGATIVE 09/11/2018 0530    Radiological Exams on Admission: No results found.    Assessment/Plan  Gross hematuria/suspected UTI Unclear etiology of gross hematuria although suspect UTI is contributory.  Patient has continuous upward trending leukocytosis, symptoms of dysuria and a temperature of 99.1. Will start Rocephin and check urine culture Urology consulted by ED physician recommended as needed bladder irrigation and to reconsult in the morning if needed.  Acute on chronic Anemia in the setting of gross hematuria Baseline around 10 but hemoglobin down trended since this morning from 8.3 to 7.6 Will receive 1 unit PRBC.  Check H&H afterwards and then every 4 hours. Transfusion threshold of less than 8 given history of CAD s/p stent  History of CAD s/p stent Asymptomatic.  No ischemic changes noted on EKG  Chronic diastolic heart failure Euvolemic on exam.  Monitor for any fluid overload with  blood transfusion  COPD Not in any acute exacerbation  Type 2 diabetes Blood glucose of 63. No symptoms of hypoglycemia. Will continue to monitor   DVT prophylaxis:SCDs Code Status: Full Family Communication: Plan discussed with patient at bedside  disposition Plan: Home with at least 2 midnight stays  Consults called:  Admission status: inpatient with at least 2 midnights given gross hematuria and acute anemia  requiring blood transfusion   Anselm Jungling DO [facility]   If 7PM-7AM, please contact night-coverage [email or web address]   09/05/2019, 10:58 PM

## 2019-09-05 NOTE — ED Provider Notes (Signed)
[facility]-EMERGENCY DEPT Provider Note   CSN: [id number] Arrival date & time: 09/05/19  0331   History Chief Complaint  Patient presents with  . Urinary Retention    Angel [name] is a 84 y.o. male.  The history is provided by the patient.  He has history of hypertension, hyperlipidemia, prostatic hypertrophy and comes in because of difficulty urinating and painful urination.  He was recently started on antibiotic for urinary tract infection.   Past Medical History:  Diagnosis Date  . Arthritis   . Benign prostatic hypertrophy   . BPH (benign prostatic hyperplasia)   . COPD (chronic obstructive pulmonary disease) (HCC)   . Coronary artery disease    status post PTCA and stenting of the left anterior descending artery  . Diabetes mellitus   . Dyspnea    with exertion   . Hyperlipidemia   . Hypertension   . Paralyzed hemidiaphragm    Left  . Pneumonia    50 years ago    Patient Active Problem List   Diagnosis Date Noted  . Acute exacerbation of CHF (congestive heart failure) (HCC) 09/11/2018  . Hypokalemia 09/11/2018  . COPD with acute exacerbation (HCC) 09/10/2018  . BPH with obstruction/lower urinary tract symptoms 05/05/2017  . Nonrheumatic aortic valve stenosis 10/03/2016  . Chronic diastolic CHF (congestive heart failure) (HCC) 10/03/2016  . Acute bronchitis   . Diaphragm paralysis   . Acute encephalopathy   . Influenza with pneumonia   . Acute urinary retention   . Chest pain on breathing   . Acute on chronic respiratory failure with hypoxemia (HCC)   . Acute respiratory failure with hypoxia (HCC) 08/12/2016  . Influenza 08/12/2016  . Hypertensive heart disease without heart failure   . Demand ischemia (HCC)   . S/P coronary artery stent placement   . COPD exacerbation (HCC) 08/11/2016  . Diabetes mellitus (HCC) 10/06/2011  . HTN (hypertension) 10/06/2011  . Hyperlipidemia 10/06/2011  . CAD (coronary artery disease) 04/07/2011    Past Surgical History:  Procedure Laterality Date  . THULIUM LASER TURP (TRANSURETHRAL RESECTION OF PROSTATE) N/A 10/30/2016   Procedure: THULIUM LASER TURP (TRANSURETHRAL RESECTION OF PROSTATE);  Surgeon: Bjorn Pippin, MD;  Location: [facility];  Service: Urology;  Laterality: N/A;  . TRANSURETHRAL RESECTION OF PROSTATE N/A 05/05/2017   Procedure: TRANSURETHRAL RESECTION OF THE PROSTATE (TURP);  Surgeon: Bjorn Pippin, MD;  Location: [facility];  Service: Urology;  Laterality: N/A;  NEEDS 60 MIN FOR PROCEDURE       Family History  Problem Relation Age of Onset  . Cancer Father   . Cancer Brother     Social History   Tobacco Use  . Smoking status: Never Smoker  . Smokeless tobacco: Never Used  Substance Use Topics  . Alcohol use: No  . Drug use: No    Home Medications Prior to Admission medications   Medication Sig Start Date End Date Taking? Authorizing Provider  acetaminophen (TYLENOL) 500 MG tablet Take 1,000 mg by mouth 2 (two) times daily.    [provider]  albuterol (PROVENTIL HFA;VENTOLIN HFA) 108 (90 Base) MCG/ACT inhaler Inhale 1-2 puffs into the lungs every 4 (four) hours as needed for wheezing or shortness of breath.    [provider]  clopidogrel (PLAVIX) 75 MG tablet TAKE 1 TABLET(75 MG) BY MOUTH AT BEDTIME 05/09/19   Nahser, Deloris Ping, MD  doxycycline (VIBRA-TABS) 100 MG tablet Take 1 tablet (100 mg total) by mouth 2 (two) times daily. 09/13/18  Modena Jansky, MD  furosemide (LASIX) 20 MG tablet Take 20 mg by mouth at bedtime.    [provider]  glipiZIDE-metformin (METAGLIP) 5-500 MG tablet Take 2 tablets by mouth 2 (two) times daily before a meal.     [provider]  Methylcobalamin (B-12) 5000 MCG TBDP Take 5,000 mcg by mouth at bedtime.    [provider]  nitroGLYCERIN (NITROSTAT) 0.4 MG SL tablet Place 0.4 mg under the tongue every 5 (five) minutes as needed for chest pain.    [provider]  Omega-3 Fatty  Acids (FISH OIL) 1200 MG CAPS Take 1,200 mg by mouth at bedtime.    [provider]  ramipril (ALTACE) 2.5 MG capsule Take 2.5 mg by mouth at bedtime.    [provider]  rosuvastatin (CRESTOR) 20 MG tablet Take 20 mg by mouth at bedtime. 08/12/18   [provider]  tamsulosin (FLOMAX) 0.4 MG CAPS capsule Take 1 capsule (0.4 mg total) by mouth daily. 09/14/18   Hongalgi, Lenis Dickinson, MD    Allergies    Patient has no known allergies.  Review of Systems   Review of Systems  All other systems reviewed and are negative.   Physical Exam Updated Vital Signs BP 136/87   Pulse 95   Temp 98.1 F (36.7 C) (Oral)   Ht 5\' 5"  (1.651 m)   Wt 63.5 kg   SpO2 100%   BMI 23.30 kg/m   Physical Exam Vitals and nursing note reviewed.   84 year old male, resting comfortably and in no acute distress. Vital signs are normal. Oxygen saturation is 100%, which is normal. Head is normocephalic and atraumatic. PERRLA, EOMI. Oropharynx is clear. Neck is nontender and supple without adenopathy or JVD. Back is nontender and there is no CVA tenderness. Lungs are clear without rales, wheezes, or rhonchi. Chest is nontender. Heart has regular rate and rhythm without murmur. Abdomen is soft, flat, nontender without masses or hepatosplenomegaly and peristalsis is normoactive.  Bladder is distended about halfway to the umbilicus. Extremities have no cyanosis or edema, full range of motion is present. Skin is warm and dry without rash. Neurologic: Mental status is normal, cranial nerves are intact, there are no motor or sensory deficits.  ED Results / Procedures / Treatments   Labs (all labs ordered are listed, but only abnormal results are displayed) Labs Reviewed  BASIC METABOLIC PANEL - Abnormal; Notable for the following components:      Result Value   Glucose, Bld 63 (*)    BUN 25 (*)    All other components within normal limits  CBC - Abnormal; Notable for the following  components:   WBC 12.8 (*)    RBC 2.76 (*)    Hemoglobin 8.3 (*)    HCT 26.4 (*)    All other components within normal limits  HEMOGLOBIN AND HEMATOCRIT, BLOOD - Abnormal; Notable for the following components:   Hemoglobin 8.2 (*)    HCT 25.8 (*)    All other components within normal limits  CBG MONITORING, ED - Abnormal; Notable for the following components:   Glucose-Capillary 69 (*)    All other components within normal limits  URINE CULTURE  URINALYSIS, ROUTINE W REFLEX MICROSCOPIC  TYPE AND SCREEN   Procedures Procedures   Medications Ordered in ED Medications  sodium chloride irrigation 0.9 % 3,000 mL (has no administration in time range)    ED Course  I have reviewed the triage vital signs and  the nursing notes.  Pertinent lab results that were available during my care of the patient were reviewed by me and considered in my medical decision making (see chart for details).  MDM Rules/Calculators/A&P Urinary retention.  Foley catheter is inserted.  Old records are reviewed, and he has a prior ED visit for urinary retention in November 2018, but that was in the setting of postop transurethral resection of prostate.  Foley catheter is placed and he is noted to have grossly bloody urine.  Bladder is irrigated.  Labs show hemoglobin of 8.3, compared with 10.81-year ago.  He is also noted to be mildly hypoglycemic with glucose of 63.  He refused orange juice but did drink some apple juice and will be given some food.  Drop in hemoglobin may be related to recent bleeding, but it is not clear.  He has been hemodynamically stable and does not need to be considered for transfusion today.  He is discharged with instructions to follow-up with urology.  Return precautions discussed.  Final Clinical Impression(s) / ED Diagnoses Final diagnoses:  Urinary retention  Gross hematuria  Normochromic normocytic anemia  Hypoglycemia    Rx / DC Orders ED Discharge Orders    None        Dione Booze, MD 09/05/19 0710

## 2019-09-05 NOTE — ED Notes (Signed)
Admitting provider at bedisde

## 2019-09-05 NOTE — ED Triage Notes (Signed)
Pt had catheter placed earlier today. Complaining of pain and bleeding at the site. Pt reports increased pain when urine drains.

## 2019-09-06 ENCOUNTER — Encounter (HOSPITAL_COMMUNITY): Payer: Self-pay | Admitting: Family Medicine

## 2019-09-06 ENCOUNTER — Inpatient Hospital Stay (HOSPITAL_COMMUNITY): Payer: Medicare Other

## 2019-09-06 DIAGNOSIS — J449 Chronic obstructive pulmonary disease, unspecified: Secondary | ICD-10-CM

## 2019-09-06 LAB — CBC
HCT: 20.7 % — ABNORMAL LOW (ref 39.0–52.0)
HCT: 22.5 % — ABNORMAL LOW (ref 39.0–52.0)
HCT: 23.4 % — ABNORMAL LOW (ref 39.0–52.0)
HCT: 23.4 % — ABNORMAL LOW (ref 39.0–52.0)
Hemoglobin: 6.6 g/dL — CL (ref 13.0–17.0)
Hemoglobin: 7.4 g/dL — ABNORMAL LOW (ref 13.0–17.0)
Hemoglobin: 7.4 g/dL — ABNORMAL LOW (ref 13.0–17.0)
Hemoglobin: 7.8 g/dL — ABNORMAL LOW (ref 13.0–17.0)
MCH: 30.3 pg (ref 26.0–34.0)
MCH: 30.3 pg (ref 26.0–34.0)
MCH: 31.1 pg (ref 26.0–34.0)
MCH: 31.5 pg (ref 26.0–34.0)
MCHC: 31.6 g/dL (ref 30.0–36.0)
MCHC: 31.9 g/dL (ref 30.0–36.0)
MCHC: 32.9 g/dL (ref 30.0–36.0)
MCHC: 33.3 g/dL (ref 30.0–36.0)
MCV: 94.4 fL (ref 80.0–100.0)
MCV: 94.5 fL (ref 80.0–100.0)
MCV: 95 fL (ref 80.0–100.0)
MCV: 95.9 fL (ref 80.0–100.0)
Platelets: 242 10*3/uL (ref 150–400)
Platelets: 255 10*3/uL (ref 150–400)
Platelets: 263 10*3/uL (ref 150–400)
Platelets: 306 10*3/uL (ref 150–400)
RBC: 2.18 MIL/uL — ABNORMAL LOW (ref 4.22–5.81)
RBC: 2.38 MIL/uL — ABNORMAL LOW (ref 4.22–5.81)
RBC: 2.44 MIL/uL — ABNORMAL LOW (ref 4.22–5.81)
RBC: 2.48 MIL/uL — ABNORMAL LOW (ref 4.22–5.81)
RDW: 13.9 % (ref 11.5–15.5)
RDW: 14 % (ref 11.5–15.5)
RDW: 14.3 % (ref 11.5–15.5)
RDW: 14.3 % (ref 11.5–15.5)
WBC: 15.4 10*3/uL — ABNORMAL HIGH (ref 4.0–10.5)
WBC: 15.5 10*3/uL — ABNORMAL HIGH (ref 4.0–10.5)
WBC: 15.9 10*3/uL — ABNORMAL HIGH (ref 4.0–10.5)
WBC: 16 10*3/uL — ABNORMAL HIGH (ref 4.0–10.5)
nRBC: 0 % (ref 0.0–0.2)
nRBC: 0 % (ref 0.0–0.2)
nRBC: 0 % (ref 0.0–0.2)
nRBC: 0 % (ref 0.0–0.2)

## 2019-09-06 LAB — BASIC METABOLIC PANEL
Anion gap: 8 (ref 5–15)
BUN: 30 mg/dL — ABNORMAL HIGH (ref 8–23)
CO2: 29 mmol/L (ref 22–32)
Calcium: 8.7 mg/dL — ABNORMAL LOW (ref 8.9–10.3)
Chloride: 100 mmol/L (ref 98–111)
Creatinine, Ser: 1.21 mg/dL (ref 0.61–1.24)
GFR calc Af Amer: >60 mL/min (ref >60)
GFR calc non Af Amer: 56 mL/min — ABNORMAL LOW (ref >60)
Glucose, Bld: 140 mg/dL — ABNORMAL HIGH (ref 70–99)
Potassium: 4.3 mmol/L (ref 3.5–5.1)
Sodium: 137 mmol/L (ref 135–145)

## 2019-09-06 LAB — URINE CULTURE: Culture: NO GROWTH

## 2019-09-06 LAB — GLUCOSE, CAPILLARY
Glucose-Capillary: 156 mg/dL — ABNORMAL HIGH (ref 70–99)
Glucose-Capillary: 269 mg/dL — ABNORMAL HIGH (ref 70–99)
Glucose-Capillary: 220 mg/dL — ABNORMAL HIGH (ref 70–99)
Glucose-Capillary: 121 mg/dL — ABNORMAL HIGH (ref 70–99)

## 2019-09-06 LAB — TYPE AND SCREEN
ABO/RH(D): O POS
Antibody Screen: NEGATIVE

## 2019-09-06 LAB — SARS CORONAVIRUS 2 (TAT 6-24 HRS): SARS Coronavirus 2: NEGATIVE

## 2019-09-06 LAB — HEMOGLOBIN A1C
Hgb A1c MFr Bld: 6.3 % — ABNORMAL HIGH (ref 4.8–5.6)
Mean Plasma Glucose: 134.11 mg/dL

## 2019-09-06 MED ORDER — INSULIN ASPART 100 UNIT/ML ~~LOC~~ SOLN
0.0000 [IU] | Freq: Three times a day (TID) | SUBCUTANEOUS | Status: DC
  Administered 2019-09-06: 8 [IU] via SUBCUTANEOUS
  Administered 2019-09-06: 5 [IU] via SUBCUTANEOUS
  Administered 2019-09-07: 3 [IU] via SUBCUTANEOUS
  Administered 2019-09-07: 2 [IU] via SUBCUTANEOUS
  Administered 2019-09-07: 3 [IU] via SUBCUTANEOUS
  Administered 2019-09-08: 2 [IU] via SUBCUTANEOUS
  Administered 2019-09-08: 3 [IU] via SUBCUTANEOUS
  Administered 2019-09-08: 8 [IU] via SUBCUTANEOUS
  Administered 2019-09-09 – 2019-09-10 (×4): 3 [IU] via SUBCUTANEOUS

## 2019-09-06 MED ORDER — FINASTERIDE 5 MG PO TABS
5.0000 mg | ORAL_TABLET | Freq: Every day | ORAL | Status: DC
  Administered 2019-09-06 – 2019-09-10 (×5): 5 mg via ORAL
  Filled 2019-09-06 (×5): qty 1

## 2019-09-06 MED ORDER — SODIUM CHLORIDE (PF) 0.9 % IJ SOLN
INTRAMUSCULAR | Status: AC
  Administered 2019-09-06: 10 mL
  Filled 2019-09-06: qty 50

## 2019-09-06 MED ORDER — IOHEXOL 300 MG/ML  SOLN
100.0000 mL | Freq: Once | INTRAMUSCULAR | Status: AC | PRN
  Administered 2019-09-06: 100 mL via INTRAVENOUS

## 2019-09-06 MED ORDER — CHLORHEXIDINE GLUCONATE CLOTH 2 % EX PADS
6.0000 | MEDICATED_PAD | Freq: Every day | CUTANEOUS | Status: DC
  Administered 2019-09-06 – 2019-09-10 (×5): 6 via TOPICAL

## 2019-09-06 MED ORDER — MORPHINE SULFATE (PF) 2 MG/ML IV SOLN
1.0000 mg | INTRAVENOUS | Status: DC | PRN
  Administered 2019-09-06 – 2019-09-09 (×10): 1 mg via INTRAVENOUS
  Filled 2019-09-06 (×10): qty 1

## 2019-09-06 MED ORDER — INSULIN ASPART 100 UNIT/ML ~~LOC~~ SOLN
0.0000 [IU] | Freq: Every day | SUBCUTANEOUS | Status: DC
  Administered 2019-09-08: 3 [IU] via SUBCUTANEOUS

## 2019-09-06 MED ORDER — ROSUVASTATIN CALCIUM 10 MG PO TABS
20.0000 mg | ORAL_TABLET | Freq: Every day | ORAL | Status: DC
  Administered 2019-09-06 – 2019-09-09 (×4): 20 mg via ORAL
  Filled 2019-09-06 (×5): qty 2

## 2019-09-06 MED ORDER — ALBUTEROL SULFATE (2.5 MG/3ML) 0.083% IN NEBU: 3.0000 mL | INHALATION_SOLUTION | RESPIRATORY_TRACT | Status: DC | PRN

## 2019-09-06 MED ORDER — SODIUM CHLORIDE 0.9 % IV SOLN
INTRAVENOUS | Status: AC
  Filled 2019-09-06: qty 250

## 2019-09-06 MED ORDER — BELLADONNA ALKALOIDS-OPIUM 16.2-60 MG RE SUPP
1.0000 | Freq: Four times a day (QID) | RECTAL | Status: DC | PRN
  Administered 2019-09-06 – 2019-09-10 (×4): 1 via RECTAL
  Filled 2019-09-06 (×6): qty 1

## 2019-09-06 MED ORDER — SODIUM CHLORIDE 0.9 % IR SOLN
3000.0000 mL | Status: DC
  Administered 2019-09-06 – 2019-09-09 (×9): 3000 mL

## 2019-09-06 NOTE — Progress Notes (Addendum)
PROGRESS NOTE    Angel [name]  ZOX:[id number] DOB: [birth year] DOA: 09/05/2019 PCP: Jackie Plum, MD    Brief Narrative:83 y.o. male with medical history significant for CAD s/p PE stent, diastolic congestive heart failure, COPD, paralyzed left hemidiaphragm, type 2 diabetes, and hypertension who presents with worsening lower abdominal pain and hematuria.  For the past 3 days he has noted lower abdominal pain and burning with urination. Also noted hematuria with clots.  He presented to the ED earlier this morning and had Foley placement. Bladder was irrigated and after showing clearing he was discharged home with urology follow up.  However, after returning home he again had worsening burning pain with urination and he decided to present back to the ER. He has history of transurethral resection of the prostate in 2018.  He denies any tobacco, alcohol illicit drug use.  Denies any fever.  No nausea, vomiting or diarrhea.  No changes in appetite.  On arrival, he had a temperature of 99.1, tachycardic but normotensive on room air.  Worsening leukocytosis up to 16 from 12.8 earlier today.  Hemoglobin of 7.6 down from 8.3 this morning.   He was given oxycodone and oxybutynin in the ED and a unit of pRBC was ordered.    Assessment & Plan:   Principal Problem:   Hematuria Active Problems:   CAD (coronary artery disease)   Diabetes mellitus (HCC)   S/P coronary artery stent placement   Chronic diastolic CHF (congestive heart failure) (HCC)   COPD (chronic obstructive pulmonary disease) (HCC)  #1  Gross hematuria-patient admitted with dysuria and hematuria with fever leukocytosis and anemia. Urine culture is pending Continue Rocephin, patient with leukocytosis WBC is up to 16. Discussed with Dr. Berneice Heinrich who will see the patient today. Patient is in a lot of pain from the bladder spasm belladonna suppositories ordered. History of TURP in 2018. Aspirin and Plavix on hold.  #2  acute on chronic anemia-Baseline hemoglobin 10.  Transfused 1 unit of packed RBCs as his hemoglobin dropped to 7.6 from 8.3 early this morning.  #3 history of type 2 diabetes continue SSI hold glipizide and Metformin.  #4 history of CAD and stent on aspirin and Plavix which unfortunately have to be on held due to gross hematuria.  #5 history of chronic diastolic CHF stable.hold lasix   #6 history of COPD stable  #7 essential hypertension hold ACE inhibitor blood pressure soft.    Estimated body mass index is 23.3 kg/m as calculated from the following:   Height as of an earlier encounter on 09/05/19: 5\' 5"  (1.651 m).   Weight as of an earlier encounter on 09/05/19: 63.5 kg.  DVT prophylaxis: SCD  code Status: Full code Family Communication: Disposition Plan: Patient admitted from home likely will discharge to home when he is medically ready and stable.  He is admitted with gross hematuria.   Consultants:   Urology  Procedures: None Antimicrobials: Rocephin  Subjective: Complaining of a lot of spasm in the bladder Foley catheter in place draining dark red urine  Objective: Vitals:   09/06/19 0412 09/06/19 0435 09/06/19 0515 09/06/19 0704  BP: 114/66 104/64  111/66  Pulse: 91 87  89  Resp: (!) 22 (!) 24  20  Temp: 98.6 F (37 C) 99.3 F (37.4 C) 98.2 F (36.8 C) 98.2 F (36.8 C)  TempSrc: Oral Oral Oral   SpO2: 95% 96%  94%    Intake/Output Summary (Last 24 hours) at 09/06/2019 1137 Last data filed at  09/06/2019 0751 Gross per 24 hour  Intake 1423.25 ml  Output 700 ml  Net 723.25 ml   There were no vitals filed for this visit.  Examination:  General exam: Appears mild distress due to pain respiratory system: Clear to auscultation. Respiratory effort normal. Cardiovascular system: S1 & S2 heard, RRR. No JVD, murmurs, rubs, gallops or clicks. No pedal edema. Gastrointestinal system: Abdomen is nondistended, soft and nontender. No organomegaly or masses felt.  Normal bowel sounds heard. Central nervous system: Alert and oriented. No focal neurological deficits. Extremities: Symmetric 5 x 5 power. Skin: No rashes, lesions or ulcers Psychiatry: Judgement and insight appear normal. Mood & affect appropriate.     Data Reviewed: I have personally reviewed following labs and imaging studies  CBC: Recent Labs  Lab 09/05/19 0409 09/05/19 0409 09/05/19 0623 09/05/19 2100 09/05/19 2351 09/06/19 0300 09/06/19 0918  WBC 12.8*  --   --  16.0* 15.5* 15.9* 15.4*  HGB 8.3*   < > 8.2* 7.6* 7.4* 6.6* 7.4*  HCT 26.4*   < > 25.8* 24.0* 23.4* 20.7* 22.5*  MCV 95.7  --   --  95.6 95.9 95.0 94.5  PLT 293  --   --  304 306 263 242   < > = values in this interval not displayed.   Basic Metabolic Panel: Recent Labs  Lab 09/05/19 0409 09/06/19 0300  NA 139 137  K 4.1 4.3  CL 102 100  CO2 28 29  GLUCOSE 63* 140*  BUN 25* 30*  CREATININE 1.02 1.21  CALCIUM 9.4 8.7*   GFR: Estimated Creatinine Clearance: 41.6 mL/min (by C-G formula based on SCr of 1.21 mg/dL). Liver Function Tests: No results for input(s): AST, ALT, ALKPHOS, BILITOT, PROT, ALBUMIN in the last 168 hours. No results for input(s): LIPASE, AMYLASE in the last 168 hours. No results for input(s): AMMONIA in the last 168 hours. Coagulation Profile: No results for input(s): INR, PROTIME in the last 168 hours. Cardiac Enzymes: No results for input(s): CKTOTAL, CKMB, CKMBINDEX, TROPONINI in the last 168 hours. BNP (last 3 results) No results for input(s): PROBNP in the last 8760 hours. HbA1C: No results for input(s): HGBA1C in the last 72 hours. CBG: Recent Labs  Lab 09/05/19 0616 09/05/19 0959 09/06/19 0734  GLUCAP 69* 108* 156*   Lipid Profile: No results for input(s): CHOL, HDL, LDLCALC, TRIG, CHOLHDL, LDLDIRECT in the last 72 hours. Thyroid Function Tests: No results for input(s): TSH, T4TOTAL, FREET4, T3FREE, THYROIDAB in the last 72 hours. Anemia Panel: No results for  input(s): VITAMINB12, FOLATE, FERRITIN, TIBC, IRON, RETICCTPCT in the last 72 hours. Sepsis Labs: No results for input(s): PROCALCITON, LATICACIDVEN in the last 168 hours.  Recent Results (from the past 240 hour(s))  Urine C&S     Status: None (Preliminary result)   Collection Time: 09/05/19  4:09 AM   Specimen: Urine, Catheterized  Result Value Ref Range Status   Specimen Description   Final    URINE, CATHETERIZED Performed at [facility], [address], [place], [place] [address]    Special Requests NONE  Final   Culture PENDING  Incomplete   Report Status PENDING  Incomplete  SARS CORONAVIRUS 2 (TAT 6-24 HRS) Nasopharyngeal Nasopharyngeal Swab     Status: None   Collection Time: 09/05/19 10:32 PM   Specimen: Nasopharyngeal Swab  Result Value Ref Range Status   SARS Coronavirus 2 NEGATIVE NEGATIVE Final    Comment: (NOTE) SARS-CoV-2 target nucleic acids are NOT DETECTED. The SARS-CoV-2  RNA is generally detectable in upper and lower respiratory specimens during the acute phase of infection. Negative results do not preclude SARS-CoV-2 infection, do not rule out co-infections with other pathogens, and should not be used as the sole basis for treatment or other patient management decisions. Negative results must be combined with clinical observations, patient history, and epidemiological information. The expected result is Negative. Fact Sheet for Patients: SugarRoll.be Fact Sheet for Healthcare Providers: https://www.woods-[employer]anie [employer]o.com/ This test is not yet approved or cleared by the [place] FDA and  has been authorized for detection and/or diagnosis of SARS-CoV-2 by FDA under an Emergency Use Authorization (EUA). This EUA will remain  in effect (meaning this test can be used) for the duration of the COVID-19 declaration under Section 56 4(b)(1) of the Act, 21 U.S.C. section 360bbb-3(b)(1), unless the  authorization is terminated or revoked sooner. Performed at [facility], [address] [place], [place] [address]          Radiology Studies: No results found.      Scheduled Meds: . Chlorhexidine Gluconate Cloth  6 each Topical Daily   Continuous Infusions: . cefTRIAXone (ROCEPHIN)  IV 1 g (09/05/19 2354)     LOS: 1 day     Georgette Shell, MD 09/06/2019, 11:37 AM

## 2019-09-06 NOTE — Progress Notes (Signed)
Amion text sent to on call RE: pt wanting to resume home meds

## 2019-09-06 NOTE — Consult Note (Signed)
Reason for Consult:Gross Hematuria, Large Benign Prostatic Hypertrophy  Referring Physician: Jacki Cones MD  Angel [name] is an 84 y.o. male.   HPI:   1 - Gross Hematuria - recurrent gross hematuria x several 2021. H/o BPH and on ASA/Plavix. Coqui 2/12 from our office negative. Admitted through ER 2/15 for Hgb 7.5 and foley placed.   2 - Large Benign Prostatic Hypertrophy - s/p path benign TURP 2018 by Jeffie Pollock.  Today " Angel [name]" is seen in consultation for recurrent and worsening gross hematuria, now with anemia. NO recent fevers. Admitted last nigth.    Past Medical History:  Diagnosis Date  . Arthritis   . Benign prostatic hypertrophy   . BPH (benign prostatic hyperplasia)   . COPD (chronic obstructive pulmonary disease) ([place])   . Coronary artery disease    status post PTCA and stenting of the left anterior descending artery  . Diabetes mellitus   . Dyspnea    with exertion   . Hyperlipidemia   . Hypertension   . Paralyzed hemidiaphragm    Left  . Pneumonia    50 years ago    Past Surgical History:  Procedure Laterality Date  . THULIUM LASER TURP (TRANSURETHRAL RESECTION OF PROSTATE) N/A 10/30/2016   Procedure: THULIUM LASER TURP (TRANSURETHRAL RESECTION OF PROSTATE);  Surgeon: Irine Seal, MD;  Location: [facility];  Service: Urology;  Laterality: N/A;  . TRANSURETHRAL RESECTION OF PROSTATE N/A 05/05/2017   Procedure: TRANSURETHRAL RESECTION OF THE PROSTATE (TURP);  Surgeon: Irine Seal, MD;  Location: [facility];  Service: Urology;  Laterality: N/A;  NEEDS 60 MIN FOR PROCEDURE    Family History  Problem Relation Age of Onset  . Cancer Father   . Cancer Brother     Social History:  reports that he has never smoked. He has never used smokeless tobacco. He reports that he does not drink alcohol or use drugs.  Allergies: No Known Allergies  Medications: I have reviewed the patient's current medications.  Results for orders placed or performed during the hospital encounter  of 09/05/19 (from the past 48 hour(s))  Type and screen     Status: None   Collection Time: 09/05/19  5:42 AM  Result Value Ref Range   ABO/RH(D) O POS    Antibody Screen NEG    Sample Expiration      09/08/2019,2359 Performed at [facility], [place] [address], [place], [place] [address]   CBC     Status: Abnormal   Collection Time: 09/05/19  9:00 PM  Result Value Ref Range   WBC 16.0 (H) 4.0 - 10.5 K/uL   RBC 2.51 (L) 4.22 - 5.81 MIL/uL   Hemoglobin 7.6 (L) 13.0 - 17.0 g/dL   HCT 24.0 (L) 39.0 - 52.0 %   MCV 95.6 80.0 - 100.0 fL   MCH 30.3 26.0 - 34.0 pg   MCHC 31.7 30.0 - 36.0 g/dL   RDW 14.3 11.5 - 15.5 %   Platelets 304 150 - 400 K/uL   nRBC 0.0 0.0 - 0.2 %    Comment: Performed at [facility], [place] [address], [place], [place] [address]  Prepare RBC     Status: None   Collection Time: 09/05/19  9:38 PM  Result Value Ref Range   Order Confirmation      ORDER PROCESSED BY BLOOD BANK Performed at [facility], [place] [address], [place], [place] [address]   SARS CORONAVIRUS 2 (TAT 6-24 HRS) Nasopharyngeal Nasopharyngeal Swab  Status: None   Collection Time: 09/05/19 10:32 PM   Specimen: Nasopharyngeal Swab  Result Value Ref Range   SARS Coronavirus 2 NEGATIVE NEGATIVE    Comment: (NOTE) SARS-CoV-2 target nucleic acids are NOT DETECTED. The SARS-CoV-2 RNA is generally detectable in upper and lower respiratory specimens during the acute phase of infection. Negative results do not preclude SARS-CoV-2 infection, do not rule out co-infections with other pathogens, and should not be used as the sole basis for treatment or other patient management decisions. Negative results must be combined with clinical observations, patient history, and epidemiological information. The expected result is Negative. Fact Sheet for Patients: HairSlick.no Fact Sheet for Healthcare  Providers: quierodirigir.com This test is not yet approved or cleared by the [place] FDA and  has been authorized for detection and/or diagnosis of SARS-CoV-2 by FDA under an Emergency Use Authorization (EUA). This EUA will remain  in effect (meaning this test can be used) for the duration of the COVID-19 declaration under Section 56 4(b)(1) of the Act, 21 U.S.C. section 360bbb-3(b)(1), unless the authorization is terminated or revoked sooner. Performed at [facility], [address], [place], [place] [address]   CBC     Status: Abnormal   Collection Time: 09/05/19 11:51 PM  Result Value Ref Range   WBC 15.5 (H) 4.0 - 10.5 K/uL   RBC 2.44 (L) 4.22 - 5.81 MIL/uL   Hemoglobin 7.4 (L) 13.0 - 17.0 g/dL   HCT 18.8 (L) 41.6 - 60.6 %   MCV 95.9 80.0 - 100.0 fL   MCH 30.3 26.0 - 34.0 pg   MCHC 31.6 30.0 - 36.0 g/dL   RDW 30.1 60.1 - 09.3 %   Platelets 306 150 - 400 K/uL   nRBC 0.0 0.0 - 0.2 %    Comment: Performed at [facility], [address], [place], [place] [address]  Basic metabolic panel     Status: Abnormal   Collection Time: 09/06/19  3:00 AM  Result Value Ref Range   Sodium 137 135 - 145 mmol/L   Potassium 4.3 3.5 - 5.1 mmol/L   Chloride 100 98 - 111 mmol/L   CO2 29 22 - 32 mmol/L   Glucose, Bld 140 (H) 70 - 99 mg/dL   BUN 30 (H) 8 - 23 mg/dL   Creatinine, Ser 3.22 0.61 - 1.24 mg/dL   Calcium 8.7 (L) 8.9 - 10.3 mg/dL   GFR calc non Af Amer 56 (L) >60 mL/min   GFR calc Af Amer >60 >60 mL/min   Anion gap 8 5 - 15    Comment: Performed at [facility], [address] [place], [place] [address]  CBC     Status: Abnormal   Collection Time: 09/06/19  3:00 AM  Result Value Ref Range   WBC 15.9 (H) 4.0 - 10.5 K/uL   RBC 2.18 (L) 4.22 - 5.81 MIL/uL   Hemoglobin 6.6 (LL) 13.0 - 17.0 g/dL    Comment: REPEATED TO VERIFY THIS CRITICAL RESULT HAS VERIFIED AND BEEN CALLED TO [facility] BY CHELSEA VARNER ON 02  16 2021 AT 0339, AND HAS BEEN READ BACK. CRITICAL RESULT VERIFIED    HCT 20.7 (L) 39.0 - 52.0 %   MCV 95.0 80.0 - 100.0 fL   MCH 30.3 26.0 - 34.0 pg   MCHC 31.9 30.0 - 36.0 g/dL   RDW 70.6 23.7 - 62.8 %   Platelets 263 150 - 400 K/uL   nRBC 0.0 0.0 - 0.2 %  Comment: Performed at [facility], [address], [place], [place] [address]  Glucose, capillary     Status: Abnormal   Collection Time: 09/06/19  7:34 AM  Result Value Ref Range   Glucose-Capillary 156 (H) 70 - 99 mg/dL  CBC     Status: Abnormal   Collection Time: 09/06/19  9:18 AM  Result Value Ref Range   WBC 15.4 (H) 4.0 - 10.5 K/uL   RBC 2.38 (L) 4.22 - 5.81 MIL/uL   Hemoglobin 7.4 (L) 13.0 - 17.0 g/dL   HCT 25.4 (L) 27.0 - 62.3 %   MCV 94.5 80.0 - 100.0 fL   MCH 31.1 26.0 - 34.0 pg   MCHC 32.9 30.0 - 36.0 g/dL   RDW 76.2 83.1 - 51.7 %   Platelets 242 150 - 400 K/uL   nRBC 0.0 0.0 - 0.2 %    Comment: Performed at [facility], [address] [place], [place] [address]  Glucose, capillary     Status: Abnormal   Collection Time: 09/06/19 11:40 AM  Result Value Ref Range   Glucose-Capillary 269 (H) 70 - 99 mg/dL    No results found.  Review of Systems  Constitutional: Positive for fatigue. Negative for chills.  HENT: Negative.   Eyes: Negative.   Respiratory: Negative.   Cardiovascular: Negative.  Negative for chest pain.  Gastrointestinal: Negative.   Endocrine: Negative.   Genitourinary: Positive for hematuria and urgency.  Musculoskeletal: Negative.   Allergic/Immunologic: Negative.   Neurological: Negative.   Hematological: Bruises/bleeds easily.  Psychiatric/Behavioral: Negative.    Blood pressure 111/66, pulse 89, temperature 98.2 F (36.8 C), resp. rate 20, SpO2 94 %. Physical Exam  Constitutional: He appears well-developed.  Daughter at bedside.   HENT:  Head: Normocephalic.  Eyes: Pupils are equal, round, and reactive to light.  Cardiovascular: Normal  rate.  Respiratory: Effort normal.  GI: Soft.  Genitourinary:    Genitourinary Comments: In situ catheter with dark bloody urine and some clots.    Musculoskeletal:     Cervical back: Normal range of motion.  Neurological: He is alert.  Skin: Skin is warm.  Psychiatric: He has a normal mood and affect.    BEDSIDE IRRIGATION: Using aseptic technique in situ catheter removed and exchanged ror 71F coude hematuria cathetr with 30cc in balloon and irritage with 2L NS to light pink, approx 500cc formed clot removed. Placed on NS irrigation 3gtt/sec, efflus pink.   Assessment/Plan:  1 - Gross Hematuria - likely prostatic fossa bleeding in setting of antiplatlet therapy. CT hematuria to f/o other etiologies. Begin finasteride daily (and continue at discharge) as best medical therapy to lower risk. THis does not appear infectious. Keep current foley on NS irrigation and light traction (to tamponade prostate), which we will manage.   2 - Large Benign Prostatic Hypertrophy - plan as per above.    Sebastian Ache 09/06/2019, 11:54 AM

## 2019-09-07 LAB — GLUCOSE, CAPILLARY
Glucose-Capillary: 140 mg/dL — ABNORMAL HIGH (ref 70–99)
Glucose-Capillary: 190 mg/dL — ABNORMAL HIGH (ref 70–99)
Glucose-Capillary: 154 mg/dL — ABNORMAL HIGH (ref 70–99)
Glucose-Capillary: 171 mg/dL — ABNORMAL HIGH (ref 70–99)

## 2019-09-07 LAB — BPAM RBC
Blood Product Expiration Date: 202103162359
ISSUE DATE / TIME: 202102160401
Unit Type and Rh: 5100

## 2019-09-07 LAB — CBC
HCT: 24.7 % — ABNORMAL LOW (ref 39.0–52.0)
Hemoglobin: 7.9 g/dL — ABNORMAL LOW (ref 13.0–17.0)
MCH: 30.5 pg (ref 26.0–34.0)
MCHC: 32 g/dL (ref 30.0–36.0)
MCV: 95.4 fL (ref 80.0–100.0)
Platelets: 242 10*3/uL (ref 150–400)
RBC: 2.59 MIL/uL — ABNORMAL LOW (ref 4.22–5.81)
RDW: 14.1 % (ref 11.5–15.5)
WBC: 15.5 10*3/uL — ABNORMAL HIGH (ref 4.0–10.5)
nRBC: 0 % (ref 0.0–0.2)

## 2019-09-07 LAB — TYPE AND SCREEN
ABO/RH(D): O POS
Antibody Screen: NEGATIVE
Unit division: 0

## 2019-09-07 LAB — BASIC METABOLIC PANEL
Anion gap: 7 (ref 5–15)
BUN: 20 mg/dL (ref 8–23)
CO2: 29 mmol/L (ref 22–32)
Calcium: 8.7 mg/dL — ABNORMAL LOW (ref 8.9–10.3)
Chloride: 100 mmol/L (ref 98–111)
Creatinine, Ser: 0.99 mg/dL (ref 0.61–1.24)
GFR calc Af Amer: >60 mL/min (ref >60)
GFR calc non Af Amer: >60 mL/min (ref >60)
Glucose, Bld: 142 mg/dL — ABNORMAL HIGH (ref 70–99)
Potassium: 4.3 mmol/L (ref 3.5–5.1)
Sodium: 136 mmol/L (ref 135–145)

## 2019-09-07 NOTE — Progress Notes (Signed)
PROGRESS NOTE    Angel [name]  OVF:[id number] DOB: February 09, 1936 DOA: 09/05/2019 PCP: Jackie Plum, MD    Brief Narrative:  84 year old gentleman with history of coronary artery disease status post stents on aspirin and Plavix, diastolic congestive heart failure, COPD, type 2 diabetes and hypertension presented to the emergency room with worsening lower abdominal pain and hematuria.  Patient was having hematuria with clots for last 3 days, presented to the ER, a Foley catheter was placed and discharged with urology follow-up, however continued to have bleeding so came back to the ER. In the emergency room, fairly stable.  Increased white blood cell count.  Hemoglobin 7.6.   Assessment & Plan:   Principal Problem:   Hematuria Active Problems:   CAD (coronary artery disease)   Diabetes mellitus (HCC)   S/P coronary artery stent placement   Chronic diastolic CHF (congestive heart failure) (HCC)   COPD (chronic obstructive pulmonary disease) (HCC)  Gross hematuria with dysuria and leukocytosis and acute blood loss anemia: Suspected bleeding from the prostate bed, currently on CBI and followed by urology.  His urine is clearing today, continue to titrate down irrigation fluid. Rocephin, day 4, no evidence of UTI, will discontinue further antibiotics. Using belladonna suppositories for bladder spasm, aspirin and Plavix on hold. Probable discharge home after urine clearing with Foley catheter.  Acute on chronic anemia: Baseline hemoglobin 10.  Received 1 unit of PRBC.  Hemoglobin has remained stable since then.  Continue close follow-up.  Type 2 diabetes, controlled on oral hypoglycemics: On sliding scale in hospital.  Coronary artery disease status post stent on aspirin Plavix: Coronary stents many many years ago.  Aspirin and Plavix on hold.  Will resume aspirin once his hematuria clears.  COPD: Stable.   DVT prophylaxis: SCDs Code Status: DNR Family Communication: Patient's  daughter on the phone Disposition Plan: patient is from home. Anticipated DC to home tomorrow, Barriers to discharge still with continuous bladder irrigation.   Consultants:   Urology  Procedures:   None  Antimicrobials:   Rocephin, 09/05/2019--09/07/2019   Subjective: Patient seen and examined.  No overnight events.  At this time his urine is clear on continuous bladder irrigation.  Objective: Vitals:   09/06/19 1521 09/06/19 2059 09/07/19 0325 09/07/19 1329  BP: 103/65 118/70 125/67 (!) 110/57  Pulse: 81 87 73 81  Resp: (!) 21 20 18  (!) 22  Temp: 98.7 F (37.1 C) 98.3 F (36.8 C) 98.1 F (36.7 C) 98.4 F (36.9 C)  TempSrc: Oral  Oral Oral  SpO2: 96% 98% 93% 95%    Intake/Output Summary (Last 24 hours) at 09/07/2019 1355 Last data filed at 09/07/2019 1045 Gross per 24 hour  Intake 2352.67 ml  Output 09/09/2019 ml  Net -17152.33 ml   There were no vitals filed for this visit.  Examination:  General exam: Appears calm and comfortable  Respiratory system: Clear to auscultation. Respiratory effort normal. Cardiovascular system: S1 & S2 heard, RRR. No JVD, murmurs, rubs, gallops or clicks. No pedal edema. Gastrointestinal system: Abdomen is nondistended, soft and nontender. No organomegaly or masses felt. Normal bowel sounds heard. Central nervous system: Alert and oriented. No focal neurological deficits. Extremities: Symmetric 5 x 5 power. Skin: No rashes, lesions or ulcers Psychiatry: Judgement and insight appear normal. Mood & affect appropriate.  Catheter with crystal-clear urine.   Data Reviewed: I have personally reviewed following labs and imaging studies  CBC: Recent Labs  Lab 09/05/19 2351 09/06/19 0300 09/06/19 0918 09/06/19 1610 09/07/19 0456  WBC 15.5* 15.9* 15.4* 16.0* 15.5*  HGB 7.4* 6.6* 7.4* 7.8* 7.9*  HCT 23.4* 20.7* 22.5* 23.4* 24.7*  MCV 95.9 95.0 94.5 94.4 95.4  PLT 306 263 242 255 242   Basic Metabolic Panel: Recent Labs  Lab  09/05/19 0409 09/06/19 0300 09/07/19 0456  NA 139 137 136  K 4.1 4.3 4.3  CL 102 100 100  CO2 28 29 29   GLUCOSE 63* 140* 142*  BUN 25* 30* 20  CREATININE 1.02 1.21 0.99  CALCIUM 9.4 8.7* 8.7*   GFR: Estimated Creatinine Clearance: 50.9 mL/min (by C-G formula based on SCr of 0.99 mg/dL). Liver Function Tests: No results for input(s): AST, ALT, ALKPHOS, BILITOT, PROT, ALBUMIN in the last 168 hours. No results for input(s): LIPASE, AMYLASE in the last 168 hours. No results for input(s): AMMONIA in the last 168 hours. Coagulation Profile: No results for input(s): INR, PROTIME in the last 168 hours. Cardiac Enzymes: No results for input(s): CKTOTAL, CKMB, CKMBINDEX, TROPONINI in the last 168 hours. BNP (last 3 results) No results for input(s): PROBNP in the last 8760 hours. HbA1C: Recent Labs    09/06/19 1610  HGBA1C 6.3*   CBG: Recent Labs  Lab 09/06/19 1140 09/06/19 1705 09/06/19 2100 09/07/19 0739 09/07/19 1217  GLUCAP 269* 220* 121* 140* 190*   Lipid Profile: No results for input(s): CHOL, HDL, LDLCALC, TRIG, CHOLHDL, LDLDIRECT in the last 72 hours. Thyroid Function Tests: No results for input(s): TSH, T4TOTAL, FREET4, T3FREE, THYROIDAB in the last 72 hours. Anemia Panel: No results for input(s): VITAMINB12, FOLATE, FERRITIN, TIBC, IRON, RETICCTPCT in the last 72 hours. Sepsis Labs: No results for input(s): PROCALCITON, LATICACIDVEN in the last 168 hours.  Recent Results (from the past 240 hour(s))  Urine C&S     Status: None   Collection Time: 09/05/19  4:09 AM   Specimen: Urine, Catheterized  Result Value Ref Range Status   Specimen Description   Final    URINE, CATHETERIZED Performed at [facility], [address], [place], [place]    Special Requests NONE  Final   Culture   Final    NO GROWTH Performed at [facility], [address], [place], [place]    Report Status 09/06/2019 FINAL  Final  SARS  CORONAVIRUS 2 (TAT 6-24 HRS) Nasopharyngeal Nasopharyngeal Swab     Status: None   Collection Time: 09/05/19 10:32 PM   Specimen: Nasopharyngeal Swab  Result Value Ref Range Status   SARS Coronavirus 2 NEGATIVE NEGATIVE Final    Comment: (NOTE) SARS-CoV-2 target nucleic acids are NOT DETECTED. The SARS-CoV-2 RNA is generally detectable in upper and lower respiratory specimens during the acute phase of infection. Negative results do not preclude SARS-CoV-2 infection, do not rule out co-infections with other pathogens, and should not be used as the sole basis for treatment or other patient management decisions. Negative results must be combined with clinical observations, patient history, and epidemiological information. The expected result is Negative. Fact Sheet for Patients: 09/07/19 Fact Sheet for Healthcare Providers: HairSlick.no This test is not yet approved or cleared by the quierodirigir.com FDA and  has been authorized for detection and/or diagnosis of SARS-CoV-2 by FDA under an Emergency Use Authorization (EUA). This EUA will remain  in effect (meaning this test can be used) for the duration of the COVID-19 declaration under Section 56 4(b)(1) of the Act, 21 U.S.C. section 360bbb-3(b)(1), unless the authorization is terminated or revoked sooner. Performed at [facility], [address], [place], [place] [address]          Radiology Studies: CT HEMATURIA WORKUP  Result Date: 09/06/2019 CLINICAL DATA:  Recurrent gross hematuria. History of benign prostatic hyperplasia and on blood thinners. Status post TURP in 2018. Anemia. EXAM: CT ABDOMEN AND PELVIS WITHOUT AND WITH CONTRAST TECHNIQUE: Multidetector CT imaging of the abdomen and pelvis was performed following the standard protocol before and following the bolus administration of intravenous contrast. CONTRAST:  120mL OMNIPAQUE IOHEXOL 300 MG/ML  SOLN  COMPARISON:  09/12/2018 renal ultrasound.  No prior CT. FINDINGS: Lower chest: Lingular scarring. Marked left hemidiaphragm elevation, with the far cephalad aspect of the left abdomen excluded. Normal heart size, without pericardial or right pleural effusion. Coronary artery atherosclerosis. Hepatobiliary: Hepatomegaly, 20.3 cm craniocaudal. Dependent gallstones on the order of 5 mm, without acute cholecystitis or biliary duct dilatation. Pancreas: Normal, without mass or ductal dilatation. Spleen: Normal imaged spleen. Adrenals/Urinary Tract: Normal adrenal glands. No renal calculi or hydronephrosis. No hydroureter or ureteric calculi. No bladder calculi. Bilateral too small to characterize renal lesions. No suspicious renal mass on post-contrast imaging. Moderate renal collecting system opacification on delayed images. Moderate to good bilateral ureteric opacification, without filling defect. The bladder is mildly distended, despite Foley catheter in place. There is wall irregularity, suggesting chronic outlet obstruction. Large volume hemorrhage within the bladder. This limits evaluation for enhancing bladder mass. No dominant bladder mass identified. Stomach/Bowel: Normal imaged stomach. Scattered colonic diverticula. Normal terminal ileum and appendix. Normal small bowel. Vascular/Lymphatic: Advanced aortic and branch vessel atherosclerosis. No abdominopelvic adenopathy. Reproductive: Moderate prostatomegaly. Other: No significant free fluid. Small fat containing right inguinal hernia. Musculoskeletal: Lumbosacral spondylosis, including at L2-3. Convex left lumbar spine curvature. IMPRESSION: 1. No upper tract explanation for hematuria. 2. Large volume bladder hemorrhage, limiting evaluation for underlying bladder mass. No convincing evidence of underlying cause. 3. Foley catheter in place with bladder distension. Correlate with Foley function. 4. Prostatomegaly with underlying bladder outlet obstruction. 5.  Marked left hemidiaphragm elevation with exclusion of portions of the left upper abdomen. 6. Coronary artery atherosclerosis. Aortic Atherosclerosis (ICD10-I70.0). 7. Cholelithiasis. Electronically Signed   By: Abigail Miyamoto M.D.   On: 09/06/2019 15:46        Scheduled Meds: . Chlorhexidine Gluconate Cloth  6 each Topical Daily  . finasteride  5 mg Oral Daily  . insulin aspart  0-15 Units Subcutaneous TID WC  . insulin aspart  0-5 Units Subcutaneous QHS  . rosuvastatin  20 mg Oral QHS   Continuous Infusions: . sodium chloride irrigation       LOS: 2 days    Time spent: 30 minutes     Barb Merino, MD [facility] Pager [phone]

## 2019-09-07 NOTE — Progress Notes (Signed)
Manually irrigated foley as pt c/o pain pain. CBI has been infusing. Moderate amt clotted blood returned each time  Amion text page sent to [facility]) as Lorain Childes. pts VSS

## 2019-09-07 NOTE — Progress Notes (Signed)
approx 0030 pt started having bladder & penis pain , cont bladder irrigatin has been maintained. Manual irrigation of foley with 1000cc produced a return of a large amount of blood clots. Pt verbalized relief of pain shortly thereafter.

## 2019-09-08 LAB — GLUCOSE, CAPILLARY
Glucose-Capillary: 151 mg/dL — ABNORMAL HIGH (ref 70–99)
Glucose-Capillary: 297 mg/dL — ABNORMAL HIGH (ref 70–99)
Glucose-Capillary: 148 mg/dL — ABNORMAL HIGH (ref 70–99)
Glucose-Capillary: 221 mg/dL — ABNORMAL HIGH (ref 70–99)

## 2019-09-08 LAB — BASIC METABOLIC PANEL
Anion gap: 10 (ref 5–15)
BUN: 20 mg/dL (ref 8–23)
CO2: 30 mmol/L (ref 22–32)
Calcium: 8.8 mg/dL — ABNORMAL LOW (ref 8.9–10.3)
Chloride: 97 mmol/L — ABNORMAL LOW (ref 98–111)
Creatinine, Ser: 1.03 mg/dL (ref 0.61–1.24)
GFR calc Af Amer: >60 mL/min (ref >60)
GFR calc non Af Amer: >60 mL/min (ref >60)
Glucose, Bld: 145 mg/dL — ABNORMAL HIGH (ref 70–99)
Potassium: 4.1 mmol/L (ref 3.5–5.1)
Sodium: 137 mmol/L (ref 135–145)

## 2019-09-08 LAB — CBC
HCT: 23.5 % — ABNORMAL LOW (ref 39.0–52.0)
Hemoglobin: 7.5 g/dL — ABNORMAL LOW (ref 13.0–17.0)
MCH: 30.7 pg (ref 26.0–34.0)
MCHC: 31.9 g/dL (ref 30.0–36.0)
MCV: 96.3 fL (ref 80.0–100.0)
Platelets: 253 10*3/uL (ref 150–400)
RBC: 2.44 MIL/uL — ABNORMAL LOW (ref 4.22–5.81)
RDW: 14 % (ref 11.5–15.5)
WBC: 13.9 10*3/uL — ABNORMAL HIGH (ref 4.0–10.5)
nRBC: 0 % (ref 0.0–0.2)

## 2019-09-08 MED ORDER — MUSCLE RUB 10-15 % EX CREA
TOPICAL_CREAM | CUTANEOUS | Status: DC | PRN
  Filled 2019-09-08: qty 85

## 2019-09-08 NOTE — Progress Notes (Signed)
PROGRESS NOTE    Angel [name]  PIR:[id number] DOB: 1935-09-07 DOA: 09/05/2019 PCP: Benito Mccreedy, MD    Brief Narrative:  84 year old gentleman with history of coronary artery disease status post stents on aspirin and Plavix, diastolic congestive heart failure, COPD, type 2 diabetes and hypertension presented to the emergency room with worsening lower abdominal pain and hematuria.  Patient was having hematuria with clots for last 3 days, presented to the ER, a Foley catheter was placed and discharged with urology follow-up, however continued to have bleeding so came back to the ER. In the emergency room, fairly stable.  Increased white blood cell count.  Hemoglobin 7.6.   Assessment & Plan:   Principal Problem:   Hematuria Active Problems:   CAD (coronary artery disease)   Diabetes mellitus (HCC)   S/P coronary artery stent placement   Chronic diastolic CHF (congestive heart failure) (HCC)   COPD (chronic obstructive pulmonary disease) (HCC)  Gross hematuria with dysuria and leukocytosis and acute blood loss anemia: Suspected bleeding from the prostate bed, currently on CBI and followed by urology.  His urine is clearing on CBI, discontinue and monitor.   Was on IV Rocephin, discontinued. Using belladonna suppository for pain and spasm and helping.  Aspirin Plavix on hold. Probable discharge home after urine clearing with Foley catheter.  Acute on chronic anemia: Baseline hemoglobin 10.  Received 1 unit of PRBC.  Hemoglobin has remained stable since then.  Continue close follow-up.  Recheck tomorrow morning.  Type 2 diabetes, controlled on oral hypoglycemics: On sliding scale in hospital.  Coronary artery disease status post stent on aspirin Plavix: Coronary stents many many years ago.  Aspirin and Plavix on hold.  Will resume aspirin once his hematuria clears.  COPD: Stable.  Advance activities.  Stop CBI.  Ambulate.  DVT prophylaxis: SCDs Code Status: Partial  code Family Communication: None. Disposition Plan: patient is from home. Anticipated DC to home tomorrow, Barriers to discharge still with continuous bladder irrigation. Patient lives alone, he has no support at night tonight. If remains without issues, may be able to go home tomorrow morning.   Consultants:   Urology  Procedures:   None  Antimicrobials:   Rocephin, 09/05/2019--09/07/2019   Subjective: Seen and examined.  No overnight events.  Does have occasional pain on his bladder area.  No bleeding since last 24 hours noted on CBI.  Objective: Vitals:   09/07/19 1329 09/07/19 2011 09/08/19 0554 09/08/19 1345  BP: (!) 110/57 125/61 119/66 104/79  Pulse: 81 76 77 84  Resp: (!) 22 (!) 23 18 18   Temp: 98.4 F (36.9 C) 98.7 F (37.1 C) 97.9 F (36.6 C) 98.6 F (37 C)  TempSrc: Oral Oral Oral Oral  SpO2: 95% 95% 95% 99%    Intake/Output Summary (Last 24 hours) at 09/08/2019 1357 Last data filed at 09/08/2019 0554 Gross per 24 hour  Intake 240 ml  Output 3750 ml  Net -3510 ml   There were no vitals filed for this visit.  Examination:  General exam: Appears calm and comfortable  Respiratory system: Clear to auscultation. Respiratory effort normal. Cardiovascular system: S1 & S2 heard, RRR. No JVD, murmurs, rubs, gallops or clicks. No pedal edema. Gastrointestinal system: Abdomen is nondistended, soft and nontender. No organomegaly or masses felt. Normal bowel sounds heard. Central nervous system: Alert and oriented. No focal neurological deficits. Extremities: Symmetric 5 x 5 power. Skin: No rashes, lesions or ulcers Psychiatry: Judgement and insight appear normal. Mood & affect appropriate.  Foley catheter with clear urine.   Data Reviewed: I have personally reviewed following labs and imaging studies  CBC: Recent Labs  Lab 09/06/19 0300 09/06/19 0918 09/06/19 1610 09/07/19 0456 09/08/19 0437  WBC 15.9* 15.4* 16.0* 15.5* 13.9*  HGB 6.6* 7.4* 7.8* 7.9*  7.5*  HCT 20.7* 22.5* 23.4* 24.7* 23.5*  MCV 95.0 94.5 94.4 95.4 96.3  PLT 263 242 255 242 253   Basic Metabolic Panel: Recent Labs  Lab 09/05/19 0409 09/06/19 0300 09/07/19 0456 09/08/19 0437  NA 139 137 136 137  K 4.1 4.3 4.3 4.1  CL 102 100 100 97*  CO2 28 29 29 30   GLUCOSE 63* 140* 142* 145*  BUN 25* 30* 20 20  CREATININE 1.02 1.21 0.99 1.03  CALCIUM 9.4 8.7* 8.7* 8.8*   GFR: Estimated Creatinine Clearance: 48.9 mL/min (by C-G formula based on SCr of 1.03 mg/dL). Liver Function Tests: No results for input(s): AST, ALT, ALKPHOS, BILITOT, PROT, ALBUMIN in the last 168 hours. No results for input(s): LIPASE, AMYLASE in the last 168 hours. No results for input(s): AMMONIA in the last 168 hours. Coagulation Profile: No results for input(s): INR, PROTIME in the last 168 hours. Cardiac Enzymes: No results for input(s): CKTOTAL, CKMB, CKMBINDEX, TROPONINI in the last 168 hours. BNP (last 3 results) No results for input(s): PROBNP in the last 8760 hours. HbA1C: Recent Labs    09/06/19 1610  HGBA1C 6.3*   CBG: Recent Labs  Lab 09/07/19 1217 09/07/19 1551 09/07/19 2135 09/08/19 0800 09/08/19 1157  GLUCAP 190* 154* 171* 151* 297*   Lipid Profile: No results for input(s): CHOL, HDL, LDLCALC, TRIG, CHOLHDL, LDLDIRECT in the last 72 hours. Thyroid Function Tests: No results for input(s): TSH, T4TOTAL, FREET4, T3FREE, THYROIDAB in the last 72 hours. Anemia Panel: No results for input(s): VITAMINB12, FOLATE, FERRITIN, TIBC, IRON, RETICCTPCT in the last 72 hours. Sepsis Labs: No results for input(s): PROCALCITON, LATICACIDVEN in the last 168 hours.  Recent Results (from the past 240 hour(s))  Urine C&S     Status: None   Collection Time: 09/05/19  4:09 AM   Specimen: Urine, Catheterized  Result Value Ref Range Status   Specimen Description   Final    URINE, CATHETERIZED Performed at [facility], [address] [place], [place]     Special Requests NONE  Final   Culture   Final    NO GROWTH Performed at [facility], [address], [place], [place]    Report Status 09/06/2019 FINAL  Final  SARS CORONAVIRUS 2 (TAT 6-24 HRS) Nasopharyngeal Nasopharyngeal Swab     Status: None   Collection Time: 09/05/19 10:32 PM   Specimen: Nasopharyngeal Swab  Result Value Ref Range Status   SARS Coronavirus 2 NEGATIVE NEGATIVE Final    Comment: (NOTE) SARS-CoV-2 target nucleic acids are NOT DETECTED. The SARS-CoV-2 RNA is generally detectable in upper and lower respiratory specimens during the acute phase of infection. Negative results do not preclude SARS-CoV-2 infection, do not rule out co-infections with other pathogens, and should not be used as the sole basis for treatment or other patient management decisions. Negative results must be combined with clinical observations, patient history, and epidemiological information. The expected result is Negative. Fact Sheet for Patients: 09/07/19 Fact Sheet for Healthcare Providers: HairSlick.no This test is not yet approved or cleared by the quierodirigir.com FDA and  has been authorized for detection and/or diagnosis of SARS-CoV-2 by FDA under an Emergency Use Authorization (  EUA). This EUA will remain  in effect (meaning this test can be used) for the duration of the COVID-19 declaration under Section 56 4(b)(1) of the Act, 21 U.S.C. section 360bbb-3(b)(1), unless the authorization is terminated or revoked sooner. Performed at [facility], [address], [place], [place] [address]          Radiology Studies: CT HEMATURIA WORKUP  Result Date: 09/06/2019 CLINICAL DATA:  Recurrent gross hematuria. History of benign prostatic hyperplasia and on blood thinners. Status post TURP in 2018. Anemia. EXAM: CT ABDOMEN AND PELVIS WITHOUT AND WITH CONTRAST TECHNIQUE: Multidetector CT imaging of the  abdomen and pelvis was performed following the standard protocol before and following the bolus administration of intravenous contrast. CONTRAST:  OMNIPAQUE IOHEXOL 300 MG/ML  SOLN COMPARISON:  09/12/2018 renal ultrasound.  No prior CT. FINDINGS: Lower chest: Lingular scarring. Marked left hemidiaphragm elevation, with the far cephalad aspect of the left abdomen excluded. Normal heart size, without pericardial or right pleural effusion. Coronary artery atherosclerosis. Hepatobiliary: Hepatomegaly, 20.3 cm craniocaudal. Dependent gallstones on the order of 5 mm, without acute cholecystitis or biliary duct dilatation. Pancreas: Normal, without mass or ductal dilatation. Spleen: Normal imaged spleen. Adrenals/Urinary Tract: Normal adrenal glands. No renal calculi or hydronephrosis. No hydroureter or ureteric calculi. No bladder calculi. Bilateral too small to characterize renal lesions. No suspicious renal mass on post-contrast imaging. Moderate renal collecting system opacification on delayed images. Moderate to good bilateral ureteric opacification, without filling defect. The bladder is mildly distended, despite Foley catheter in place. There is wall irregularity, suggesting chronic outlet obstruction. Large volume hemorrhage within the bladder. This limits evaluation for enhancing bladder mass. No dominant bladder mass identified. Stomach/Bowel: Normal imaged stomach. Scattered colonic diverticula. Normal terminal ileum and appendix. Normal small bowel. Vascular/Lymphatic: Advanced aortic and branch vessel atherosclerosis. No abdominopelvic adenopathy. Reproductive: Moderate prostatomegaly. Other: No significant free fluid. Small fat containing right inguinal hernia. Musculoskeletal: Lumbosacral spondylosis, including at L2-3. Convex left lumbar spine curvature. IMPRESSION: 1. No upper tract explanation for hematuria. 2. Large volume bladder hemorrhage, limiting evaluation for underlying bladder mass. No  convincing evidence of underlying cause. 3. Foley catheter in place with bladder distension. Correlate with Foley function. 4. Prostatomegaly with underlying bladder outlet obstruction. 5. Marked left hemidiaphragm elevation with exclusion of portions of the left upper abdomen. 6. Coronary artery atherosclerosis. Aortic Atherosclerosis (ICD10-I70.0). 7. Cholelithiasis. Electronically Signed   By: Jeronimo Greaves M.D.   On: 09/06/2019 15:46        Scheduled Meds: . Chlorhexidine Gluconate Cloth  6 each Topical Daily  . finasteride  5 mg Oral Daily  . insulin aspart  0-15 Units Subcutaneous TID WC  . insulin aspart  0-5 Units Subcutaneous QHS  . rosuvastatin  20 mg Oral QHS   Continuous Infusions: . sodium chloride irrigation       LOS: 3 days    Time spent: 30 minutes     Dorcas Carrow, MD [facility] Pager [phone]

## 2019-09-08 NOTE — Progress Notes (Signed)
CBI stopped per MD.

## 2019-09-08 NOTE — Care Management Important Message (Signed)
Important Message  Patient Details IM Letter given to Daryel Gerald SW Case Manager to present to the Patient Name: ILIYA SPIVACK MRN: [id number] Date of Birth: 1936-06-25   Medicare Important Message Given:  Yes     Caren Macadam 09/08/2019, 12:33 PM

## 2019-09-08 NOTE — Progress Notes (Signed)
RN informed during report CBI clamped by MD and was to remain clamped unless pt pass clots or bleeding noted. Current order states to continue CBI. RN phoned 4th floor charge RN for assistance, d/t pt is requesting CBI to run to help allow him to sleep tonight. Per charge, if pt not passing clots should be ok to continue continuous bladder irrigation but to page on-call hospitalist for clarification. Page sent to on-call to follow-up with night RN.

## 2019-09-08 NOTE — Progress Notes (Signed)
Patient ambulatory in hallway with assist of 2 and use of cane.  Steady gait with assist.  Patient tolerated activity well.  Foley output remains clear yellow with no blood or clots noted.  Patient settled back into bed with siderails up x3 and callbell in reach

## 2019-09-09 LAB — HEMOGLOBIN AND HEMATOCRIT, BLOOD
HCT: 23.7 % — ABNORMAL LOW (ref 39.0–52.0)
Hemoglobin: 7.8 g/dL — ABNORMAL LOW (ref 13.0–17.0)

## 2019-09-09 LAB — GLUCOSE, CAPILLARY
Glucose-Capillary: 164 mg/dL — ABNORMAL HIGH (ref 70–99)
Glucose-Capillary: 195 mg/dL — ABNORMAL HIGH (ref 70–99)
Glucose-Capillary: 199 mg/dL — ABNORMAL HIGH (ref 70–99)
Glucose-Capillary: 157 mg/dL — ABNORMAL HIGH (ref 70–99)

## 2019-09-09 MED ORDER — FINASTERIDE 5 MG PO TABS: 5.0000 mg | ORAL_TABLET | Freq: Every day | ORAL | 0 refills | Status: AC

## 2019-09-09 MED ORDER — BELLADONNA ALKALOIDS-OPIUM 16.2-60 MG RE SUPP: 1.0000 | Freq: Four times a day (QID) | RECTAL | 0 refills | Status: DC | PRN

## 2019-09-09 MED ORDER — ACETAMINOPHEN 500 MG PO TABS: 1000.0000 mg | ORAL_TABLET | Freq: Four times a day (QID) | ORAL | 0 refills | Status: AC | PRN

## 2019-09-09 MED ORDER — OXYCODONE HCL 5 MG PO TABS: 5.0000 mg | ORAL_TABLET | Freq: Three times a day (TID) | ORAL | 0 refills | Status: AC | PRN

## 2019-09-09 MED ORDER — OXYCODONE HCL 5 MG PO TABS
5.0000 mg | ORAL_TABLET | Freq: Four times a day (QID) | ORAL | Status: DC | PRN
  Administered 2019-09-10: 5 mg via ORAL
  Filled 2019-09-09: qty 1

## 2019-09-09 NOTE — Progress Notes (Signed)
Pt ambulated hall using cane with 1 assist. Tolerated ambulation well. Denies pain.

## 2019-09-09 NOTE — Discharge Summary (Signed)
Physician Discharge Summary  Angel [name] XLK:[id number] DOB: May 06, 1936 DOA: 09/05/2019  PCP: Jackie Plum, MD  Admit date: 09/05/2019 Discharge date: 09/09/2019  Admitted From: home  Disposition:  Home   Recommendations for Outpatient Follow-up:  1. Follow up with your urology clinic as scheduled.  Home Health:NA  Equipment/Devices:NA   Discharge Condition:stable   CODE STATUS:Partial code. He does not want to be on ventilator  Diet recommendation: low carb diet   Discharge Summary: 84 year old gentleman with history of coronary artery disease status post stents on aspirin and Plavix, diastolic congestive heart failure, COPD, type 2 diabetes and hypertension presented to the emergency room with worsening lower abdominal pain and hematuria.  Patient was having hematuria with clots for last 3 days, presented to the ER, a Foley catheter was placed and discharged with urology follow-up, however continued to have bleeding so came back to the ER. He has h/o BPH and s/p TURP in 2018. In the emergency room, fairly stable.  Increased white blood cell count. Gross hematuria needing CBI. Hemoglobin 6.6.   Patient stayed in the hospital due to ongoing hematuria needing continuous bladder irrigation, he received 1 unit of PRBC transfusion with appropriate response.  He also had bladder spasm and pain.  Ultimately, he has some clinical improvement.  For the last 24 hours, no hematuria after stopping CBI.  Pain was controlled on belladonna suppository and occasional use of opiates.  Hemoglobin 7.8. Initially treated with IV Rocephin, clinically improved, negative cultures and antibiotics were stopped.  As per urology plan, patient will go home with Foley catheter in place, educated about Foley care. Patient is on dual antiplatelet therapy, his last stent was more than 5 years ago, will resume aspirin, hold Plavix until clinically stabilizes and she may have to undergo cystoscopy and further  procedure. Short course of opiate was prescribed as well as belladonna suppository.    Discharge Diagnoses:  Principal Problem:   Hematuria Active Problems:   CAD (coronary artery disease)   Diabetes mellitus (HCC)   S/P coronary artery stent placement   Chronic diastolic CHF (congestive heart failure) (HCC)   COPD (chronic obstructive pulmonary disease) (HCC)    Discharge Instructions  Discharge Instructions    Call MD for:  severe uncontrolled pain   Complete by: As directed    Call MD for:  temperature >100.4   Complete by: As directed    Diet Carb Modified   Complete by: As directed    Discharge instructions   Complete by: As directed    Resume aspirin, hold plavix until seen in follow up with Urology   Increase activity slowly   Complete by: As directed      Allergies as of 09/09/2019   No Known Allergies     Medication List    STOP taking these medications   ciprofloxacin 250 MG tablet Commonly known as: CIPRO   clopidogrel 75 MG tablet Commonly known as: PLAVIX   doxycycline 100 MG tablet Commonly known as: VIBRA-TABS   tamsulosin 0.4 MG Caps capsule Commonly known as: FLOMAX     TAKE these medications   acetaminophen 500 MG tablet Commonly known as: TYLENOL Take 2 tablets (1,000 mg total) by mouth every 6 (six) hours as needed for mild pain or headache. What changed: when to take this   albuterol 108 (90 Base) MCG/ACT inhaler Commonly known as: VENTOLIN HFA Inhale 1-2 puffs into the lungs every 4 (four) hours as needed for wheezing or shortness of breath.  aspirin 81 MG chewable tablet Chew 81 mg by mouth daily.   B-12 5000 MCG Tbdp Take 5,000 mcg by mouth at bedtime.   finasteride 5 MG tablet Commonly known as: PROSCAR Take 1 tablet (5 mg total) by mouth daily. Start taking on: September 10, 2019   Fish Oil 1200 MG Caps Take 1,200 mg by mouth at bedtime.   furosemide 20 MG tablet Commonly known as: LASIX Take 20 mg by mouth at  bedtime.   glipiZIDE-metformin 5-500 MG tablet Commonly known as: METAGLIP Take 2 tablets by mouth 2 (two) times daily before a meal.   nitroGLYCERIN 0.4 MG SL tablet Commonly known as: NITROSTAT Place 0.4 mg under the tongue every 5 (five) minutes as needed for chest pain.   opium-belladonna 16.2-60 MG suppository Commonly known as: B&O SUPPRETTES Place 1 suppository rectally every 6 (six) hours as needed for bladder spasms.   oxyCODONE 5 MG immediate release tablet Commonly known as: Oxy IR/ROXICODONE Take 1 tablet (5 mg total) by mouth every 8 (eight) hours as needed for up to 5 days for moderate pain or severe pain.   ramipril 2.5 MG capsule Commonly known as: ALTACE Take 2.5 mg by mouth at bedtime.   rosuvastatin 20 MG tablet Commonly known as: CRESTOR Take 20 mg by mouth at bedtime.   VITAMIN C PO Take 1 tablet by mouth daily.   VITAMIN D PO Take 1 capsule by mouth daily.       No Known Allergies  Consultations:  Urology    Procedures/Studies: CT HEMATURIA WORKUP  Result Date: 09/06/2019 CLINICAL DATA:  Recurrent gross hematuria. History of benign prostatic hyperplasia and on blood thinners. Status post TURP in 2018. Anemia. EXAM: CT ABDOMEN AND PELVIS WITHOUT AND WITH CONTRAST TECHNIQUE: Multidetector CT imaging of the abdomen and pelvis was performed following the standard protocol before and following the bolus administration of intravenous contrast. CONTRAST:  OMNIPAQUE IOHEXOL 300 MG/ML  SOLN COMPARISON:  09/12/2018 renal ultrasound.  No prior CT. FINDINGS: Lower chest: Lingular scarring. Marked left hemidiaphragm elevation, with the far cephalad aspect of the left abdomen excluded. Normal heart size, without pericardial or right pleural effusion. Coronary artery atherosclerosis. Hepatobiliary: Hepatomegaly, 20.3 cm craniocaudal. Dependent gallstones on the order of 5 mm, without acute cholecystitis or biliary duct dilatation. Pancreas: Normal, without  mass or ductal dilatation. Spleen: Normal imaged spleen. Adrenals/Urinary Tract: Normal adrenal glands. No renal calculi or hydronephrosis. No hydroureter or ureteric calculi. No bladder calculi. Bilateral too small to characterize renal lesions. No suspicious renal mass on post-contrast imaging. Moderate renal collecting system opacification on delayed images. Moderate to good bilateral ureteric opacification, without filling defect. The bladder is mildly distended, despite Foley catheter in place. There is wall irregularity, suggesting chronic outlet obstruction. Large volume hemorrhage within the bladder. This limits evaluation for enhancing bladder mass. No dominant bladder mass identified. Stomach/Bowel: Normal imaged stomach. Scattered colonic diverticula. Normal terminal ileum and appendix. Normal small bowel. Vascular/Lymphatic: Advanced aortic and branch vessel atherosclerosis. No abdominopelvic adenopathy. Reproductive: Moderate prostatomegaly. Other: No significant free fluid. Small fat containing right inguinal hernia. Musculoskeletal: Lumbosacral spondylosis, including at L2-3. Convex left lumbar spine curvature. IMPRESSION: 1. No upper tract explanation for hematuria. 2. Large volume bladder hemorrhage, limiting evaluation for underlying bladder mass. No convincing evidence of underlying cause. 3. Foley catheter in place with bladder distension. Correlate with Foley function. 4. Prostatomegaly with underlying bladder outlet obstruction. 5. Marked left hemidiaphragm elevation with exclusion of portions of the left upper abdomen. 6.  Coronary artery atherosclerosis. Aortic Atherosclerosis (ICD10-I70.0). 7. Cholelithiasis. Electronically Signed   By: Jeronimo Greaves M.D.   On: 09/06/2019 15:46      Subjective: Patient seen and examined.  Daughter at bedside.  He had severe spasm last night and requested nurses to put him back on CBI.  Urine remains clear. Patient ambulated in the hallway with catheter  on, currently fairly asymptomatic and pain controlled.   Discharge Exam: Vitals:   09/09/19 1310 09/09/19 1355  BP: 129/68 107/60  Pulse: 79 88  Resp: 16 17  Temp: 98.6 F (37 C) 98.7 F (37.1 C)  SpO2:  98%   Vitals:   09/08/19 2009 09/09/19 0500 09/09/19 1310 09/09/19 1355  BP: 118/80 129/68 129/68 107/60  Pulse: 88 79 79 88  Resp: 16 16 16 17   Temp: 98.8 F (37.1 C) 98.6 F (37 C) 98.6 F (37 C) 98.7 F (37.1 C)  TempSrc: Oral Oral Oral Oral  SpO2: 96% 94%  98%  Weight:   63.5 kg   Height:   5\' 5"  (1.651 m)     General: Pt is alert, awake, not in acute distress Cardiovascular: RRR, S1/S2 +, no rubs, no gallops Respiratory: CTA bilaterally, no wheezing, no rhonchi Abdominal: Soft, NT, ND, bowel sounds + Extremities: no edema, no cyanosis Foley catheter with clear urine.    The results of significant diagnostics from this hospitalization (including imaging, microbiology, ancillary and laboratory) are listed below for reference.     Microbiology: Recent Results (from the past 240 hour(s))  Urine C&S     Status: None   Collection Time: 09/05/19  4:09 AM   Specimen: Urine, Catheterized  Result Value Ref Range Status   Specimen Description   Final    URINE, CATHETERIZED Performed at [facility], [address] [place], [place] [address]    Special Requests NONE  Final   Culture   Final    NO GROWTH Performed at [facility], [address], [place], [place] [address]    Report Status 09/06/2019 FINAL  Final  SARS CORONAVIRUS 2 (TAT 6-24 HRS) Nasopharyngeal Nasopharyngeal Swab     Status: None   Collection Time: 09/05/19 10:32 PM   Specimen: Nasopharyngeal Swab  Result Value Ref Range Status   SARS Coronavirus 2 NEGATIVE NEGATIVE Final    Comment: (NOTE) SARS-CoV-2 target nucleic acids are NOT DETECTED. The SARS-CoV-2 RNA is generally detectable in upper and lower respiratory specimens during the acute phase of infection.  Negative results do not preclude SARS-CoV-2 infection, do not rule out co-infections with other pathogens, and should not be used as the sole basis for treatment or other patient management decisions. Negative results must be combined with clinical observations, patient history, and epidemiological information. The expected result is Negative. Fact Sheet for Patients: HairSlick.no Fact Sheet for Healthcare Providers: quierodirigir.com This test is not yet approved or cleared by the [place] FDA and  has been authorized for detection and/or diagnosis of SARS-CoV-2 by FDA under an Emergency Use Authorization (EUA). This EUA will remain  in effect (meaning this test can be used) for the duration of the COVID-19 declaration under Section 56 4(b)(1) of the Act, 21 U.S.C. section 360bbb-3(b)(1), unless the authorization is terminated or revoked sooner. Performed at [facility], [address] [place], [place] [address]      Labs: BNP (last 3 results) Recent Labs    09/10/18 2036  BNP 190.7*   Basic Metabolic Panel: Recent  Labs  Lab 09/05/19 0409 09/06/19 0300 09/07/19 0456 09/08/19 0437  NA 139 137 136 137  K 4.1 4.3 4.3 4.1  CL 102 100 100 97*  CO2 28 29 29 30   GLUCOSE 63* 140* 142* 145*  BUN 25* 30* 20 20  CREATININE 1.02 1.21 0.99 1.03  CALCIUM 9.4 8.7* 8.7* 8.8*   Liver Function Tests: No results for input(s): AST, ALT, ALKPHOS, BILITOT, PROT, ALBUMIN in the last 168 hours. No results for input(s): LIPASE, AMYLASE in the last 168 hours. No results for input(s): AMMONIA in the last 168 hours. CBC: Recent Labs  Lab 09/06/19 0300 09/06/19 0300 09/06/19 0918 09/06/19 1610 09/07/19 0456 09/08/19 0437 09/09/19 0439  WBC 15.9*  --  15.4* 16.0* 15.5* 13.9*  --   HGB 6.6*   < > 7.4* 7.8* 7.9* 7.5* 7.8*  HCT 20.7*   < > 22.5* 23.4* 24.7* 23.5* 23.7*  MCV 95.0  --  94.5 94.4 95.4 96.3  --   PLT 263   --  242 255 242 253  --    < > = values in this interval not displayed.   Cardiac Enzymes: No results for input(s): CKTOTAL, CKMB, CKMBINDEX, TROPONINI in the last 168 hours. BNP: Invalid input(s): POCBNP CBG: Recent Labs  Lab 09/08/19 1157 09/08/19 1643 09/08/19 2123 09/09/19 0803 09/09/19 1148  GLUCAP 297* 148* 221* 164* 195*   D-Dimer No results for input(s): DDIMER in the last 72 hours. Hgb A1c Recent Labs    09/06/19 1610  HGBA1C 6.3*   Lipid Profile No results for input(s): CHOL, HDL, LDLCALC, TRIG, CHOLHDL, LDLDIRECT in the last 72 hours. Thyroid function studies No results for input(s): TSH, T4TOTAL, T3FREE, THYROIDAB in the last 72 hours.  Invalid input(s): FREET3 Anemia work up No results for input(s): VITAMINB12, FOLATE, FERRITIN, TIBC, IRON, RETICCTPCT in the last 72 hours. Urinalysis    Component Value Date/Time   COLORURINE YELLOW 09/11/2018 0530   APPEARANCEUR CLEAR 09/11/2018 0530   LABSPEC 1.010 09/11/2018 0530   PHURINE 5.0 09/11/2018 0530   GLUCOSEU >=500 (A) 09/11/2018 0530   HGBUR NEGATIVE 09/11/2018 0530   BILIRUBINUR NEGATIVE 09/11/2018 0530   KETONESUR NEGATIVE 09/11/2018 0530   PROTEINUR 30 (A) 09/11/2018 0530   NITRITE NEGATIVE 09/11/2018 0530   LEUKOCYTESUR NEGATIVE 09/11/2018 0530   Sepsis Labs Invalid input(s): PROCALCITONIN,  WBC,  LACTICIDVEN Microbiology Recent Results (from the past 240 hour(s))  Urine C&S     Status: None   Collection Time: 09/05/19  4:09 AM   Specimen: Urine, Catheterized  Result Value Ref Range Status   Specimen Description   Final    URINE, CATHETERIZED Performed at [facility], [address], [place], [place]    Special Requests NONE  Final   Culture   Final    NO GROWTH Performed at [facility], [address] [place], [place]    Report Status 09/06/2019 FINAL  Final  SARS CORONAVIRUS 2 (TAT 6-24 HRS) Nasopharyngeal Nasopharyngeal Swab     Status:  None   Collection Time: 09/05/19 10:32 PM   Specimen: Nasopharyngeal Swab  Result Value Ref Range Status   SARS Coronavirus 2 NEGATIVE NEGATIVE Final    Comment: (NOTE) SARS-CoV-2 target nucleic acids are NOT DETECTED. The SARS-CoV-2 RNA is generally detectable in upper and lower respiratory specimens during the acute phase of infection. Negative results do not preclude SARS-CoV-2 infection, do not rule out co-infections with other pathogens, and should not be used  as the sole basis for treatment or other patient management decisions. Negative results must be combined with clinical observations, patient history, and epidemiological information. The expected result is Negative. Fact Sheet for Patients: SugarRoll.be Fact Sheet for Healthcare Providers: https://www.woods-mathews.com/ This test is not yet approved or cleared by the [place] FDA and  has been authorized for detection and/or diagnosis of SARS-CoV-2 by FDA under an Emergency Use Authorization (EUA). This EUA will remain  in effect (meaning this test can be used) for the duration of the COVID-19 declaration under Section 56 4(b)(1) of the Act, 21 U.S.C. section 360bbb-3(b)(1), unless the authorization is terminated or revoked sooner. Performed at [facility], [place] [address] [place], [place] [address]      Time coordinating discharge:  32 minutes  SIGNED:   Barb Merino, MD  [facility] 09/09/2019, 2:07 PM

## 2019-09-09 NOTE — Progress Notes (Addendum)
Pt is being discharged home today with foley catheter in place per MD orders. Discharge instructions including medications and follow up appointments given. Pt educated on foley care and supplies provided to maintain foley at home with daughter Arline Asp) at bedside. During foley care education, RN passed bloody urine into drainage bag with small noted blood clot. Pt became concerned about being discharge and requested to see provider. MD notified.

## 2019-09-09 NOTE — Progress Notes (Signed)
Pt had several episodes of moaning / calling out in pain in his bladder/penis and complained he did not hurt if the CBI was infusing. He did not like it stopped. I restarted it at a trickle but when he had pain the flow was increased and pain meds provided. Pt voices concerns about not being able to go home with this type of pain.

## 2019-09-09 NOTE — Progress Notes (Signed)
PROGRESS NOTE    Angel [name]  YQI:[id number] DOB: [birth year] DOA: 09/05/2019 PCP: Jackie Plum, MD    Brief Narrative:  84 year old gentleman with history of coronary artery disease status post stents on aspirin and Plavix, diastolic congestive heart failure, COPD, type 2 diabetes and hypertension presented to the emergency room with worsening lower abdominal pain and hematuria.  Patient was having hematuria with clots for last 3 days, presented to the ER, a Foley catheter was placed and discharged with urology follow-up, however continued to have bleeding so came back to the ER. In the emergency room, fairly stable.  Increased white blood cell count.  Hemoglobin 6.6.   Assessment & Plan:   Principal Problem:   Hematuria Active Problems:   CAD (coronary artery disease)   Diabetes mellitus (HCC)   S/P coronary artery stent placement   Chronic diastolic CHF (congestive heart failure) (HCC)   COPD (chronic obstructive pulmonary disease) (HCC)  Gross hematuria with dysuria and leukocytosis and acute blood loss anemia: Suspected bleeding from the prostate bed, currently on CBI and followed by urology.  Patient did well, ready to discharge today, started having hematuria again. Stay back in the hospital for monitoring.  Will manually flush, if more blood clots, restart CBI.  Acute on chronic anemia: Baseline hemoglobin 10.  Received 1 unit of PRBC.  Hemoglobin has remained stable since then.  Continue close follow-up.  Recheck tomorrow morning.  Type 2 diabetes, controlled on oral hypoglycemics: On sliding scale in hospital.  Coronary artery disease status post stent on aspirin Plavix: Coronary stents many many years ago.  Aspirin and Plavix on hold.  Will resume aspirin once his hematuria clears.  COPD: Stable.  Cancel discharge.  Monitor for hematuria.  DVT prophylaxis: SCDs Code Status: Partial code Family Communication: Daughter at the bedside. Disposition Plan: patient  is from home. Anticipated DC to home tomorrow, Barriers to discharge still with hematuria.    Consultants:   Urology  Procedures:   None  Antimicrobials:   Rocephin, 09/05/2019--09/07/2019   Subjective: Seen multiple times today.  Remained well, prepared to discharge. He walked and had some fresh blood clot in his catheter, cleaned and now with pink urine.  Objective: Vitals:   09/08/19 2009 09/09/19 0500 09/09/19 1310 09/09/19 1355  BP: 118/80 129/68 129/68 107/60  Pulse: 88 79 79 88  Resp: 16 16 16 17   Temp: 98.8 F (37.1 C) 98.6 F (37 C) 98.6 F (37 C) 98.7 F (37.1 C)  TempSrc: Oral Oral Oral Oral  SpO2: 96% 94%  98%  Weight:   63.5 kg   Height:   5\' 5"  (1.651 m)     Intake/Output Summary (Last 24 hours) at 09/09/2019 1512 Last data filed at 09/09/2019 0916 Gross per 24 hour  Intake 484 ml  Output 3650 ml  Net -3166 ml   Filed Weights   09/09/19 1310  Weight: 63.5 kg    Examination:  General exam: Appears calm and comfortable  Respiratory system: Clear to auscultation. Respiratory effort normal. Cardiovascular system: S1 & S2 heard, RRR. No JVD, murmurs, rubs, gallops or clicks. No pedal edema. Gastrointestinal system: Abdomen is nondistended, soft and nontender. No organomegaly or masses felt. Normal bowel sounds heard. Central nervous system: Alert and oriented. No focal neurological deficits. Extremities: Symmetric 5 x 5 power. Skin: No rashes, lesions or ulcers Psychiatry: Judgement and insight appear normal. Mood & affect appropriate.  Foley catheter with pinkish tinged urine.   Data Reviewed: I have personally reviewed  following labs and imaging studies  CBC: Recent Labs  Lab 09/06/19 0300 09/06/19 0300 09/06/19 0918 09/06/19 1610 09/07/19 0456 09/08/19 0437 09/09/19 0439  WBC 15.9*  --  15.4* 16.0* 15.5* 13.9*  --   HGB 6.6*   < > 7.4* 7.8* 7.9* 7.5* 7.8*  HCT 20.7*   < > 22.5* 23.4* 24.7* 23.5* 23.7*  MCV 95.0  --  94.5 94.4 95.4  96.3  --   PLT 263  --  242 255 242 253  --    < > = values in this interval not displayed.   Basic Metabolic Panel: Recent Labs  Lab 09/05/19 0409 09/06/19 0300 09/07/19 0456 09/08/19 0437  NA 139 137 136 137  K 4.1 4.3 4.3 4.1  CL 102 100 100 97*  CO2 28 29 29 30   GLUCOSE 63* 140* 142* 145*  BUN 25* 30* 20 20  CREATININE 1.02 1.21 0.99 1.03  CALCIUM 9.4 8.7* 8.7* 8.8*   GFR: Estimated Creatinine Clearance: 48.9 mL/min (by C-G formula based on SCr of 1.03 mg/dL). Liver Function Tests: No results for input(s): AST, ALT, ALKPHOS, BILITOT, PROT, ALBUMIN in the last 168 hours. No results for input(s): LIPASE, AMYLASE in the last 168 hours. No results for input(s): AMMONIA in the last 168 hours. Coagulation Profile: No results for input(s): INR, PROTIME in the last 168 hours. Cardiac Enzymes: No results for input(s): CKTOTAL, CKMB, CKMBINDEX, TROPONINI in the last 168 hours. BNP (last 3 results) No results for input(s): PROBNP in the last 8760 hours. HbA1C: Recent Labs    09/06/19 1610  HGBA1C 6.3*   CBG: Recent Labs  Lab 09/08/19 1157 09/08/19 1643 09/08/19 2123 09/09/19 0803 09/09/19 1148  GLUCAP 297* 148* 221* 164* 195*   Lipid Profile: No results for input(s): CHOL, HDL, LDLCALC, TRIG, CHOLHDL, LDLDIRECT in the last 72 hours. Thyroid Function Tests: No results for input(s): TSH, T4TOTAL, FREET4, T3FREE, THYROIDAB in the last 72 hours. Anemia Panel: No results for input(s): VITAMINB12, FOLATE, FERRITIN, TIBC, IRON, RETICCTPCT in the last 72 hours. Sepsis Labs: No results for input(s): PROCALCITON, LATICACIDVEN in the last 168 hours.  Recent Results (from the past 240 hour(s))  Urine C&S     Status: None   Collection Time: 09/05/19  4:09 AM   Specimen: Urine, Catheterized  Result Value Ref Range Status   Specimen Description   Final    URINE, CATHETERIZED Performed at [facility] [address], [place], [place] [address]    Special  Requests NONE  Final   Culture   Final    NO GROWTH Performed at [facility], [place] [address] [place], [place] [address]    Report Status 09/06/2019 FINAL  Final  SARS CORONAVIRUS 2 (TAT 6-24 HRS) Nasopharyngeal Nasopharyngeal Swab     Status: None   Collection Time: 09/05/19 10:32 PM   Specimen: Nasopharyngeal Swab  Result Value Ref Range Status   SARS Coronavirus 2 NEGATIVE NEGATIVE Final    Comment: (NOTE) SARS-CoV-2 target nucleic acids are NOT DETECTED. The SARS-CoV-2 RNA is generally detectable in upper and lower respiratory specimens during the acute phase of infection. Negative results do not preclude SARS-CoV-2 infection, do not rule out co-infections with other pathogens, and should not be used as the sole basis for treatment or other patient management decisions. Negative results must be combined with clinical observations, patient history, and epidemiological information. The expected result is Negative. Fact Sheet for Patients: SugarRoll.be Fact Sheet for Healthcare Providers: https://www.woods-mathews.com/ This  test is not yet approved or cleared by the [place] and  has been authorized for detection and/or diagnosis of SARS-CoV-2 by FDA under an Emergency Use Authorization (EUA). This EUA will remain  in effect (meaning this test can be used) for the duration of the COVID-19 declaration under Section 56 4(b)(1) of the Act, 21 U.S.C. section 360bbb-3(b)(1), unless the authorization is terminated or revoked sooner. Performed at [facility], [address] [place], [place] [address]          Radiology Studies: No results found.      Scheduled Meds: . Chlorhexidine Gluconate Cloth  6 each Topical Daily  . finasteride  5 mg Oral Daily  . insulin aspart  0-15 Units Subcutaneous TID WC  . insulin aspart  0-5 Units Subcutaneous QHS  . rosuvastatin  20 mg Oral QHS   Continuous Infusions:     LOS: 4 days    Time spent: 25 minutes     Dorcas Carrow, MD [facility] Pager [phone]

## 2019-09-10 LAB — CBC WITH DIFFERENTIAL/PLATELET
Abs Immature Granulocytes: 0.04 10*3/uL (ref 0.00–0.07)
Basophils Absolute: 0.1 10*3/uL (ref 0.0–0.1)
Basophils Relative: 1 %
Eosinophils Absolute: 0.3 10*3/uL (ref 0.0–0.5)
Eosinophils Relative: 2 %
HCT: 24.9 % — ABNORMAL LOW (ref 39.0–52.0)
Hemoglobin: 8 g/dL — ABNORMAL LOW (ref 13.0–17.0)
Immature Granulocytes: 0 %
Lymphocytes Relative: 15 %
Lymphs Abs: 1.8 10*3/uL (ref 0.7–4.0)
MCH: 30.8 pg (ref 26.0–34.0)
MCHC: 32.1 g/dL (ref 30.0–36.0)
MCV: 95.8 fL (ref 80.0–100.0)
Monocytes Absolute: 1.5 10*3/uL — ABNORMAL HIGH (ref 0.1–1.0)
Monocytes Relative: 13 %
Neutro Abs: 8 10*3/uL — ABNORMAL HIGH (ref 1.7–7.7)
Neutrophils Relative %: 69 %
Platelets: 307 10*3/uL (ref 150–400)
RBC: 2.6 MIL/uL — ABNORMAL LOW (ref 4.22–5.81)
RDW: 13.9 % (ref 11.5–15.5)
WBC: 11.6 10*3/uL — ABNORMAL HIGH (ref 4.0–10.5)
nRBC: 0 % (ref 0.0–0.2)

## 2019-09-10 LAB — GLUCOSE, CAPILLARY: Glucose-Capillary: 154 mg/dL — ABNORMAL HIGH (ref 70–99)

## 2019-09-10 NOTE — Discharge Summary (Signed)
Physician Discharge Summary  Angel [name] FIE:[id number] DOB: 1935-09-25 DOA: 09/05/2019  PCP: Jackie Plum, MD  Admit date: 09/05/2019 Discharge date: 09/10/2019  Admitted From: home  Disposition:  Home   Recommendations for Outpatient Follow-up:  1. Follow up with your urology clinic as scheduled.  Home Health:NA  Equipment/Devices:NA   Discharge Condition:stable   CODE STATUS:Partial code. He does not want to be on ventilator  Diet recommendation: low carb diet   Discharge Summary: 84 year old gentleman with history of coronary artery disease status post stents on aspirin and Plavix, diastolic congestive heart failure, COPD, type 2 diabetes and hypertension presented to the emergency room with worsening lower abdominal pain and hematuria.  Patient was having hematuria with clots for last 3 days, presented to the ER, a Foley catheter was placed and discharged with urology follow-up, however continued to have bleeding so came back to the ER. He has h/o BPH and s/p TURP in 2018. In the emergency room, fairly stable.  Increased white blood cell count. Gross hematuria needing CBI. Hemoglobin 6.6.   Patient stayed in the hospital due to ongoing hematuria needing continuous bladder irrigation, he received 1 unit of PRBC transfusion with appropriate response.  He also had bladder spasm and pain.  Ultimately, he has some clinical improvement.  For the last 24 hours, no hematuria after stopping CBI.  Pain was controlled on belladonna suppository and occasional use of opiates.  Hemoglobin 7.8. Initially treated with IV Rocephin, clinically improved, negative cultures and antibiotics were stopped.  As per urology plan, patient will go home with Foley catheter in place, educated about Foley care. Patient is on dual antiplatelet therapy, his last stent was more than 5 years ago, will hold both aspirin and plavix until clinically stabilizes and he may have to undergo cystoscopy and further  procedure. Short course of opiate was prescribed, belladona not available out pt.    Discharge Diagnoses:  Principal Problem:   Hematuria Active Problems:   CAD (coronary artery disease)   Diabetes mellitus (HCC)   S/P coronary artery stent placement   Chronic diastolic CHF (congestive heart failure) (HCC)   COPD (chronic obstructive pulmonary disease) (HCC)    Discharge Instructions  Discharge Instructions    Call MD for:  severe uncontrolled pain   Complete by: As directed    Call MD for:  temperature >100.4   Complete by: As directed    Diet Carb Modified   Complete by: As directed    Discharge instructions   Complete by: As directed    Resume aspirin, hold plavix until seen in follow up with Urology   Discharge instructions   Complete by: As directed    Do not take aspirin and plavix until seen at Urology office for follow up   Increase activity slowly   Complete by: As directed    Increase activity slowly   Complete by: As directed      Allergies as of 09/10/2019   No Known Allergies     Medication List    STOP taking these medications   aspirin 81 MG chewable tablet   ciprofloxacin 250 MG tablet Commonly known as: CIPRO   clopidogrel 75 MG tablet Commonly known as: PLAVIX   doxycycline 100 MG tablet Commonly known as: VIBRA-TABS   tamsulosin 0.4 MG Caps capsule Commonly known as: FLOMAX     TAKE these medications   acetaminophen 500 MG tablet Commonly known as: TYLENOL Take 2 tablets (1,000 mg total) by mouth every 6 (six)  hours as needed for mild pain or headache. What changed: when to take this   albuterol 108 (90 Base) MCG/ACT inhaler Commonly known as: VENTOLIN HFA Inhale 1-2 puffs into the lungs every 4 (four) hours as needed for wheezing or shortness of breath.   B-12 5000 MCG Tbdp Take 5,000 mcg by mouth at bedtime.   finasteride 5 MG tablet Commonly known as: PROSCAR Take 1 tablet (5 mg total) by mouth daily.   Fish Oil 1200 MG  Caps Take 1,200 mg by mouth at bedtime.   furosemide 20 MG tablet Commonly known as: LASIX Take 20 mg by mouth at bedtime.   glipiZIDE-metformin 5-500 MG tablet Commonly known as: METAGLIP Take 2 tablets by mouth 2 (two) times daily before a meal.   nitroGLYCERIN 0.4 MG SL tablet Commonly known as: NITROSTAT Place 0.4 mg under the tongue every 5 (five) minutes as needed for chest pain.   opium-belladonna 16.2-60 MG suppository Commonly known as: B&O SUPPRETTES Place 1 suppository rectally every 6 (six) hours as needed for bladder spasms.   oxyCODONE 5 MG immediate release tablet Commonly known as: Oxy IR/ROXICODONE Take 1 tablet (5 mg total) by mouth every 8 (eight) hours as needed for up to 5 days for moderate pain or severe pain.   ramipril 2.5 MG capsule Commonly known as: ALTACE Take 2.5 mg by mouth at bedtime.   rosuvastatin 20 MG tablet Commonly known as: CRESTOR Take 20 mg by mouth at bedtime.   VITAMIN C PO Take 1 tablet by mouth daily.   VITAMIN D PO Take 1 capsule by mouth daily.       No Known Allergies  Consultations:  Urology    Procedures/Studies: CT HEMATURIA WORKUP  Result Date: 09/06/2019 CLINICAL DATA:  Recurrent gross hematuria. History of benign prostatic hyperplasia and on blood thinners. Status post TURP in 2018. Anemia. EXAM: CT ABDOMEN AND PELVIS WITHOUT AND WITH CONTRAST TECHNIQUE: Multidetector CT imaging of the abdomen and pelvis was performed following the standard protocol before and following the bolus administration of intravenous contrast. CONTRAST:  137mL OMNIPAQUE IOHEXOL 300 MG/ML  SOLN COMPARISON:  09/12/2018 renal ultrasound.  No prior CT. FINDINGS: Lower chest: Lingular scarring. Marked left hemidiaphragm elevation, with the far cephalad aspect of the left abdomen excluded. Normal heart size, without pericardial or right pleural effusion. Coronary artery atherosclerosis. Hepatobiliary: Hepatomegaly, 20.3 cm craniocaudal.  Dependent gallstones on the order of 5 mm, without acute cholecystitis or biliary duct dilatation. Pancreas: Normal, without mass or ductal dilatation. Spleen: Normal imaged spleen. Adrenals/Urinary Tract: Normal adrenal glands. No renal calculi or hydronephrosis. No hydroureter or ureteric calculi. No bladder calculi. Bilateral too small to characterize renal lesions. No suspicious renal mass on post-contrast imaging. Moderate renal collecting system opacification on delayed images. Moderate to good bilateral ureteric opacification, without filling defect. The bladder is mildly distended, despite Foley catheter in place. There is wall irregularity, suggesting chronic outlet obstruction. Large volume hemorrhage within the bladder. This limits evaluation for enhancing bladder mass. No dominant bladder mass identified. Stomach/Bowel: Normal imaged stomach. Scattered colonic diverticula. Normal terminal ileum and appendix. Normal small bowel. Vascular/Lymphatic: Advanced aortic and branch vessel atherosclerosis. No abdominopelvic adenopathy. Reproductive: Moderate prostatomegaly. Other: No significant free fluid. Small fat containing right inguinal hernia. Musculoskeletal: Lumbosacral spondylosis, including at L2-3. Convex left lumbar spine curvature. IMPRESSION: 1. No upper tract explanation for hematuria. 2. Large volume bladder hemorrhage, limiting evaluation for underlying bladder mass. No convincing evidence of underlying cause. 3. Foley catheter in place with  bladder distension. Correlate with Foley function. 4. Prostatomegaly with underlying bladder outlet obstruction. 5. Marked left hemidiaphragm elevation with exclusion of portions of the left upper abdomen. 6. Coronary artery atherosclerosis. Aortic Atherosclerosis (ICD10-I70.0). 7. Cholelithiasis. Electronically Signed   By: Jeronimo Greaves M.D.   On: 09/06/2019 15:46     Subjective: Patient seen and examined. No more hematuria since yesterday. Pain is  controlled.  Urine remains clear. Patient ambulated in the hallway with catheter on, currently fairly asymptomatic and pain controlled. He was very interested in MOST form and I helped him fill it with daughter at the bedside on 2/19  Discharge Exam: Vitals:   09/09/19 2031 09/10/19 0537  BP: 118/80 116/62  Pulse: 81 81  Resp: 20 19  Temp: 98.6 F (37 C) 98.6 F (37 C)  SpO2: 98% 96%   Vitals:   09/09/19 1310 09/09/19 1355 09/09/19 2031 09/10/19 0537  BP: 129/68 107/60 118/80 116/62  Pulse: 79 88 81 81  Resp: 16 17 20 19   Temp: 98.6 F (37 C) 98.7 F (37.1 C) 98.6 F (37 C) 98.6 F (37 C)  TempSrc: Oral Oral Oral Oral  SpO2:  98% 98% 96%  Weight: 63.5 kg     Height: 5\' 5"  (1.651 m)       General: Pt is alert, awake, not in acute distress Cardiovascular: RRR, S1/S2 +, no rubs, no gallops Respiratory: CTA bilaterally, no wheezing, no rhonchi Abdominal: Soft, NT, ND, bowel sounds + Extremities: no edema, no cyanosis Foley catheter with clear urine.    The results of significant diagnostics from this hospitalization (including imaging, microbiology, ancillary and laboratory) are listed below for reference.     Microbiology: Recent Results (from the past 240 hour(s))  Urine C&S     Status: None   Collection Time: 09/05/19  4:09 AM   Specimen: Urine, Catheterized  Result Value Ref Range Status   Specimen Description   Final    URINE, CATHETERIZED Performed at [facility], [address], [place], [place] [address]    Special Requests NONE  Final   Culture   Final    NO GROWTH Performed at [facility], [address] [place], [place] [address]    Report Status 09/06/2019 FINAL  Final  SARS CORONAVIRUS 2 (TAT 6-24 HRS) Nasopharyngeal Nasopharyngeal Swab     Status: None   Collection Time: 09/05/19 10:32 PM   Specimen: Nasopharyngeal Swab  Result Value Ref Range Status   SARS Coronavirus 2 NEGATIVE NEGATIVE Final    Comment:  (NOTE) SARS-CoV-2 target nucleic acids are NOT DETECTED. The SARS-CoV-2 RNA is generally detectable in upper and lower respiratory specimens during the acute phase of infection. Negative results do not preclude SARS-CoV-2 infection, do not rule out co-infections with other pathogens, and should not be used as the sole basis for treatment or other patient management decisions. Negative results must be combined with clinical observations, patient history, and epidemiological information. The expected result is Negative. Fact Sheet for Patients: HairSlick.no Fact Sheet for Healthcare Providers: quierodirigir.com This test is not yet approved or cleared by the [place] FDA and  has been authorized for detection and/or diagnosis of SARS-CoV-2 by FDA under an Emergency Use Authorization (EUA). This EUA will remain  in effect (meaning this test can be used) for the duration of the COVID-19 declaration under Section 56 4(b)(1) of the Act, 21 U.S.C. section 360bbb-3(b)(1), unless the authorization is terminated or revoked sooner. Performed at [facility], [address], [place], [place] [address]      Labs: BNP (last 3 results) Recent Labs    09/10/18 2036  BNP 190.7*   Basic Metabolic Panel: Recent Labs  Lab 09/05/19 0409 09/06/19 0300 09/07/19 0456 09/08/19 0437  NA 139 137 136 137  K 4.1 4.3 4.3 4.1  CL 102 100 100 97*  CO2 28 29 29 30   GLUCOSE 63* 140* 142* 145*  BUN 25* 30* 20 20  CREATININE 1.02 1.21 0.99 1.03  CALCIUM 9.4 8.7* 8.7* 8.8*   Liver Function Tests: No results for input(s): AST, ALT, ALKPHOS, BILITOT, PROT, ALBUMIN in the last 168 hours. No results for input(s): LIPASE, AMYLASE in the last 168 hours. No results for input(s): AMMONIA in the last 168 hours. CBC: Recent Labs  Lab 09/06/19 0918 09/06/19 0918 09/06/19 1610 09/07/19 0456 09/08/19 0437 09/09/19 0439 09/10/19 0610  WBC  15.4*  --  16.0* 15.5* 13.9*  --  11.6*  NEUTROABS  --   --   --   --   --   --  8.0*  HGB 7.4*   < > 7.8* 7.9* 7.5* 7.8* 8.0*  HCT 22.5*   < > 23.4* 24.7* 23.5* 23.7* 24.9*  MCV 94.5  --  94.4 95.4 96.3  --  95.8  PLT 242  --  255 242 253  --  307   < > = values in this interval not displayed.   Cardiac Enzymes: No results for input(s): CKTOTAL, CKMB, CKMBINDEX, TROPONINI in the last 168 hours. BNP: Invalid input(s): POCBNP CBG: Recent Labs  Lab 09/09/19 0803 09/09/19 1148 09/09/19 1658 09/09/19 2130 09/10/19 0748  GLUCAP 164* 195* 199* 157* 154*   D-Dimer No results for input(s): DDIMER in the last 72 hours. Hgb A1c No results for input(s): HGBA1C in the last 72 hours. Lipid Profile No results for input(s): CHOL, HDL, LDLCALC, TRIG, CHOLHDL, LDLDIRECT in the last 72 hours. Thyroid function studies No results for input(s): TSH, T4TOTAL, T3FREE, THYROIDAB in the last 72 hours.  Invalid input(s): FREET3 Anemia work up No results for input(s): VITAMINB12, FOLATE, FERRITIN, TIBC, IRON, RETICCTPCT in the last 72 hours. Urinalysis    Component Value Date/Time   COLORURINE YELLOW 09/11/2018 0530   APPEARANCEUR CLEAR 09/11/2018 0530   LABSPEC 1.010 09/11/2018 0530   PHURINE 5.0 09/11/2018 0530   GLUCOSEU >=500 (A) 09/11/2018 0530   HGBUR NEGATIVE 09/11/2018 0530   BILIRUBINUR NEGATIVE 09/11/2018 0530   KETONESUR NEGATIVE 09/11/2018 0530   PROTEINUR 30 (A) 09/11/2018 0530   NITRITE NEGATIVE 09/11/2018 0530   LEUKOCYTESUR NEGATIVE 09/11/2018 0530   Sepsis Labs Invalid input(s): PROCALCITONIN,  WBC,  LACTICIDVEN Microbiology Recent Results (from the past 240 hour(s))  Urine C&S     Status: None   Collection Time: 09/05/19  4:09 AM   Specimen: Urine, Catheterized  Result Value Ref Range Status   Specimen Description   Final    URINE, CATHETERIZED Performed at [facility], [address], [place], [place]    Special Requests NONE  Final    Culture   Final    NO GROWTH Performed at [facility], [address] [place], [place]    Report Status 09/06/2019 FINAL  Final  SARS CORONAVIRUS 2 (TAT 6-24 HRS) Nasopharyngeal Nasopharyngeal Swab     Status: None   Collection Time: 09/05/19 10:32 PM   Specimen: Nasopharyngeal Swab  Result Value Ref Range Status   SARS Coronavirus 2  NEGATIVE NEGATIVE Final    Comment: (NOTE) SARS-CoV-2 target nucleic acids are NOT DETECTED. The SARS-CoV-2 RNA is generally detectable in upper and lower respiratory specimens during the acute phase of infection. Negative results do not preclude SARS-CoV-2 infection, do not rule out co-infections with other pathogens, and should not be used as the sole basis for treatment or other patient management decisions. Negative results must be combined with clinical observations, patient history, and epidemiological information. The expected result is Negative. Fact Sheet for Patients: HairSlick.no Fact Sheet for Healthcare Providers: quierodirigir.com This test is not yet approved or cleared by the [place] FDA and  has been authorized for detection and/or diagnosis of SARS-CoV-2 by FDA under an Emergency Use Authorization (EUA). This EUA will remain  in effect (meaning this test can be used) for the duration of the COVID-19 declaration under Section 56 4(b)(1) of the Act, 21 U.S.C. section 360bbb-3(b)(1), unless the authorization is terminated or revoked sooner. Performed at [facility], [address], [place], [place] [address]      Time coordinating discharge:  32 minutes  SIGNED:   Dorcas Carrow, MD  [facility] 09/10/2019, 9:29 AM

## 2019-09-10 NOTE — Progress Notes (Signed)
Pt leaving at this time with his daughter. Alert and oriented. Discharge instructions given/explained with pt and his daughter verbalizing understanding. Pt aware of followup appt. Pt leaving with Foley catheter intact and Foley supplies.

## 2019-09-10 NOTE — Progress Notes (Signed)
No hematuria/blood clots noted throughout shift

## 2019-09-14 DIAGNOSIS — R310 Gross hematuria: Secondary | ICD-10-CM | POA: Diagnosis not present

## 2019-09-15 DIAGNOSIS — J441 Chronic obstructive pulmonary disease with (acute) exacerbation: Secondary | ICD-10-CM | POA: Diagnosis not present

## 2019-09-26 DIAGNOSIS — R310 Gross hematuria: Secondary | ICD-10-CM | POA: Diagnosis not present

## 2019-09-26 DIAGNOSIS — D62 Acute posthemorrhagic anemia: Secondary | ICD-10-CM | POA: Diagnosis not present

## 2019-09-26 DIAGNOSIS — R350 Frequency of micturition: Secondary | ICD-10-CM | POA: Diagnosis not present

## 2019-09-30 DIAGNOSIS — M19011 Primary osteoarthritis, right shoulder: Secondary | ICD-10-CM | POA: Diagnosis not present

## 2019-10-05 ENCOUNTER — Telehealth: Payer: Self-pay | Admitting: Cardiovascular Disease

## 2019-10-05 NOTE — Telephone Encounter (Signed)
  Daughter would like to come with her father to his appt with Dr Elease Hashimoto on 10/10/19. She states he needs assistance with walking, some interpretation and he has beginnings of dementia so he tends to forget things.

## 2019-10-05 NOTE — Telephone Encounter (Signed)
I spoke with patient's daughter. She would like to accompany patient to appointment as noted below.  I told her I would make note that she would attend appointment with patient.

## 2019-10-10 ENCOUNTER — Ambulatory Visit: Payer: Medicare Other | Admitting: Cardiovascular Disease

## 2019-10-10 ENCOUNTER — Encounter: Payer: Self-pay | Admitting: Cardiovascular Disease

## 2019-10-10 ENCOUNTER — Other Ambulatory Visit: Payer: Self-pay

## 2019-10-10 VITALS — BP 128/58 | HR 83 | Ht 63.0 in | Wt 137.5 lb

## 2019-10-10 DIAGNOSIS — I10 Essential (primary) hypertension: Secondary | ICD-10-CM

## 2019-10-10 DIAGNOSIS — R0989 Other specified symptoms and signs involving the circulatory and respiratory systems: Secondary | ICD-10-CM | POA: Diagnosis not present

## 2019-10-10 DIAGNOSIS — I2510 Atherosclerotic heart disease of native coronary artery without angina pectoris: Secondary | ICD-10-CM

## 2019-10-10 DIAGNOSIS — I350 Nonrheumatic aortic (valve) stenosis: Secondary | ICD-10-CM

## 2019-10-10 NOTE — Progress Notes (Signed)
Angel [name] Date of Birth  Apr 16, 1936    [address]    [address]     [place], [place]  [address]       Problems: 1. Coronary artery disease-status post PTCA and stenting of the LAD 2. Paralyzed left hemidiaphragm 3. Diabetes mellitus 4. Hypertension 5. Hyperlipidemia   Angel [name] is a 84 yo with the above noted hx.  No chest pain.  He remains active .  He's not had any episodes of chest pain or shortness breath. He still exercises on an intermittent basis.  Nov 29, 2012:  Angel [name] is doing well. No CP,  Breathing is the same- slightly short of breath.  He is still working 4 days a week.  He has put in a garden this spring - has tomatoes, peppers, etc.   Nov. 10, 2014:  Angel [name] is doing well.  No angina.    Dec 05, 2013:  Angel [name] is doing well.  No angina.  Staying active.    January 09, 2015:  Doing well, no episodes of angina. He does all of his normal activities.  Still gardening .    February 04, 2016:  Still working,   Still has his garden . No CP , Breathing is stable   October 03, 2016:  Angel [name] is seen today for follow up visit .  Just getting over the flu and pneumonia. Spent 10 days in hospital.   He has a paralized left hemi - diaphragm and feels about like his normal state.   Saw pulmonary a month ago  - O2 sats were 96% at that time .   No angina .   Back doing his normal activities    January 05, 2017:  Angel [name] is seen today. No cardiac issues  BP is a little low, No dizziness.   July 07, 2017:  Angel [name] is seen today for follow up visit of his CAd Remaining active.  No Angina   February 15, 2018 :  Angel [name] is seen today for follow up visit,  Seen with daughter, Angel [name].  For his CAD and hyperlipidemia. Doing his normal activitiies. Breathing is ok  BP has been on the low side. His Primary md has tried holding his ramipril but his BP went too high   August 16, 2018:  Angel [name] is seen today for follow-up of his coronary artery disease.  Also has a history of  hyperlipidemia.  Denies any chest pain or shortness of breath.  Exercises regularly.  Still does all that he wants to do.  Sept. 14, 2020 Angel [name] is seen today for follow up of his CAD and hyperlipidemia.  He denies any angina.  He has a paralyzed hemidiaphragm which limits his breathing on occasion. No limitations    October 10, 2019: Angel [name] is seen today for his coronary artery disease and hyperlipidemia.  He has a paralyzed left hemidiaphragm.  Seen with daugher, Angel [name].  No DOE  Has developed some deafness.   Angel [name] helped to interpret.   I previous years, he did not need an interpreter.    Current Outpatient Medications on File Prior to Visit  Medication Sig Dispense Refill  . acetaminophen (TYLENOL) 500 MG tablet Take 2 tablets (1,000 mg total) by mouth every 6 (six) hours as needed for mild pain or headache. 30 tablet 0  . albuterol (PROVENTIL HFA;VENTOLIN HFA) 108 (90 Base) MCG/ACT inhaler Inhale 1-2 puffs into the lungs every 4 (four) hours as needed for wheezing or shortness of  breath.    . clopidogrel (PLAVIX) 75 MG tablet Take 75 mg by mouth daily.    . finasteride (PROSCAR) 5 MG tablet Take 1 tablet (5 mg total) by mouth daily. 30 tablet 0  . furosemide (LASIX) 20 MG tablet Take 20 mg by mouth at bedtime.    Marland Kitchen glipiZIDE-metformin (METAGLIP) 5-500 MG tablet Take 2 tablets by mouth 2 (two) times daily before a meal.     . Methylcobalamin (B-12) 5000 MCG TBDP Take 5,000 mcg by mouth at bedtime.    . nitroGLYCERIN (NITROSTAT) 0.4 MG SL tablet Place 0.4 mg under the tongue every 5 (five) minutes as needed for chest pain.    . Omega-3 Fatty Acids (FISH OIL) 1200 MG CAPS Take 1,200 mg by mouth at bedtime.    . ramipril (ALTACE) 2.5 MG capsule Take 2.5 mg by mouth at bedtime.    . rosuvastatin (CRESTOR) 20 MG tablet Take 20 mg by mouth at bedtime.    Marland Kitchen VITAMIN D PO Take 1 capsule by mouth daily.     No current facility-administered medications on file prior to visit.    No Known  Allergies  Past Medical History:  Diagnosis Date  . Acute bronchitis   . Acute encephalopathy   . Acute exacerbation of CHF (congestive heart failure) (HCC) 09/11/2018  . Acute on chronic respiratory failure with hypoxemia (HCC)   . Acute respiratory failure with hypoxia (HCC) 08/12/2016  . Acute urinary retention   . Arthritis   . Benign prostatic hypertrophy   . BPH (benign prostatic hyperplasia)   . BPH with obstruction/lower urinary tract symptoms 05/05/2017  . CAD (coronary artery disease) 04/07/2011  . Chest pain on breathing   . Chronic diastolic CHF (congestive heart failure) (HCC) 10/03/2016  . COPD (chronic obstructive pulmonary disease) (HCC)   . Coronary artery disease    status post PTCA and stenting of the left anterior descending artery  . Demand ischemia (HCC)   . Diabetes mellitus (HCC) 10/06/2011  . Diaphragm paralysis   . Dyspnea    with exertion   . Hematuria 09/05/2019  . HTN (hypertension) 10/06/2011  . Hyperlipidemia   . Hypertension   . Hypertensive heart disease without heart failure   . Hypokalemia 09/11/2018  . Influenza 08/12/2016  . Influenza with pneumonia   . Nonrheumatic aortic valve stenosis 10/03/2016  . Paralyzed hemidiaphragm    Left  . Pneumonia    50 years ago  . S/P coronary artery stent placement     Past Surgical History:  Procedure Laterality Date  . THULIUM LASER TURP (TRANSURETHRAL RESECTION OF PROSTATE) N/A 10/30/2016   Procedure: THULIUM LASER TURP (TRANSURETHRAL RESECTION OF PROSTATE);  Surgeon: Bjorn Pippin, MD;  Location: [facility];  Service: Urology;  Laterality: N/A;  . TRANSURETHRAL RESECTION OF PROSTATE N/A 05/05/2017   Procedure: TRANSURETHRAL RESECTION OF THE PROSTATE (TURP);  Surgeon: Bjorn Pippin, MD;  Location: [facility];  Service: Urology;  Laterality: N/A;  NEEDS 60 MIN FOR PROCEDURE    Social History   Tobacco Use  Smoking Status Never Smoker  Smokeless Tobacco Never Used    Social History   Substance and Sexual  Activity  Alcohol Use No    Family History  Problem Relation Age of Onset  . Cancer Father   . Cancer Brother     Reviw of Systems:  Noted in current history  Physical Exam: Blood pressure (!) 128/58, pulse 83, height 5\' 3"  (1.6 m), weight 137 lb 8 oz (62.4 kg),  SpO2 98 %.  GEN:   Elderly male,   NAD  HEENT: Normal NECK: No JVD; left carotid bruit  LYMPHATICS: No lymphadenopathy CARDIAC: RRR  , 2-3/6 systolic mumur RESPIRATORY:  Clear to auscultation without rales, wheezing or rhonchi  ABDOMEN: Soft, non-tender, non-distended MUSCULOSKELETAL:  No edema; No deformity  SKIN: Warm and dry NEUROLOGIC:  Alert and oriented x 3     ECG:     October 10, 2019:   NSR at 81.  No ST / T wave abn.     Assessment / Plan:   1. Coronary artery disease-status post PTCA and stenting of the LAD -    No angina .   Cont plavix   2. Paralyzed left hemidiaphragm -  Chronic , stable   3. Diabetes mellitus - plans per primary MD   4. Hypertension:  BP is well controlled.    5. Hyperlipidemia   .   7. Chronic diastolic CHF : No signs or symptoms of congestive heart failure.  8.  Left carotid bruit:  Has a left carotid bruit versus radiation of his systolic murmur.  Will check a carotid duplex scan.       Kristeen Miss, MD  10/10/2019 8:10 AM    [facility] [address] [place],  [address] [place], [place]  [address] Pager [phone] Phone: [phone]; Fax: [phone]

## 2019-10-10 NOTE — Patient Instructions (Signed)
Medication Instructions:   Your physician recommends that you continue on your current medications as directed. Please refer to the Current Medication list given to you today.  *If you need a refill on your cardiac medications before your next appointment, please call your pharmacy*    Testing/Procedures:  Your physician has requested that you have a carotid duplex. This test is an ultrasound of the carotid arteries in your neck. It looks at blood flow through these arteries that supply the brain with blood. Allow one hour for this exam. There are no restrictions or special instructions.   Follow-Up:  1. Your physician wants you to follow-up in: 6 MONTHS WITH AN EXTENDER/APP IN THE OFFICE You will receive a reminder letter in the mail two months in advance. If you don't receive a letter, please call our office to schedule the follow-up appointment.   2. At [facility], you and your health needs are our priority.  As part of our continuing mission to provide you with exceptional heart care, we have created designated Provider Care Teams.  These Care Teams include your primary Cardiologist (physician) and Advanced Practice Providers (APPs -  Physician Assistants and Nurse Practitioners) who all work together to provide you with the care you need, when you need it.  We recommend signing up for the patient portal called "MyChart".  Sign up information is provided on this After Visit Summary.  MyChart is used to connect with patients for Virtual Visits (Telemedicine).  Patients are able to view lab/test results, encounter notes, upcoming appointments, etc.  Non-urgent messages can be sent to your provider as well.   To learn more about what you can do with MyChart, go to [email or web address].    Your next appointment:   12 month(s)  The format for your next appointment:   In Person  Provider:   Kristeen Miss, MD

## 2019-10-13 DIAGNOSIS — J441 Chronic obstructive pulmonary disease with (acute) exacerbation: Secondary | ICD-10-CM | POA: Diagnosis not present

## 2019-10-19 ENCOUNTER — Other Ambulatory Visit: Payer: Self-pay

## 2019-10-19 MED ORDER — NITROGLYCERIN 0.4 MG SL SUBL: 0.4000 mg | SUBLINGUAL_TABLET | SUBLINGUAL | 2 refills | Status: DC | PRN

## 2019-10-21 ENCOUNTER — Ambulatory Visit (HOSPITAL_COMMUNITY)
Admission: RE | Admit: 2019-10-21 | Discharge: 2019-10-21 | Disposition: A | Discharge status: Home / Self Care | Payer: Medicare Other | Source: Ambulatory Visit | Attending: Cardiology | Admitting: Cardiology

## 2019-10-21 ENCOUNTER — Encounter (INDEPENDENT_AMBULATORY_CARE_PROVIDER_SITE_OTHER): Payer: Self-pay

## 2019-10-21 ENCOUNTER — Other Ambulatory Visit: Payer: Self-pay

## 2019-10-21 DIAGNOSIS — R0989 Other specified symptoms and signs involving the circulatory and respiratory systems: Secondary | ICD-10-CM | POA: Diagnosis not present

## 2019-10-21 DIAGNOSIS — I350 Nonrheumatic aortic (valve) stenosis: Secondary | ICD-10-CM

## 2019-10-21 DIAGNOSIS — I2510 Atherosclerotic heart disease of native coronary artery without angina pectoris: Secondary | ICD-10-CM | POA: Insufficient documentation

## 2019-11-13 DIAGNOSIS — J441 Chronic obstructive pulmonary disease with (acute) exacerbation: Secondary | ICD-10-CM | POA: Diagnosis not present

## 2019-11-21 DIAGNOSIS — Z0001 Encounter for general adult medical examination with abnormal findings: Secondary | ICD-10-CM | POA: Diagnosis not present

## 2019-11-21 DIAGNOSIS — Z136 Encounter for screening for cardiovascular disorders: Secondary | ICD-10-CM | POA: Diagnosis not present

## 2019-11-21 DIAGNOSIS — E1151 Type 2 diabetes mellitus with diabetic peripheral angiopathy without gangrene: Secondary | ICD-10-CM | POA: Diagnosis not present

## 2019-11-21 DIAGNOSIS — Z1329 Encounter for screening for other suspected endocrine disorder: Secondary | ICD-10-CM | POA: Diagnosis not present

## 2019-11-21 DIAGNOSIS — I10 Essential (primary) hypertension: Secondary | ICD-10-CM | POA: Diagnosis not present

## 2019-12-13 DIAGNOSIS — J441 Chronic obstructive pulmonary disease with (acute) exacerbation: Secondary | ICD-10-CM | POA: Diagnosis not present

## 2019-12-26 DIAGNOSIS — R310 Gross hematuria: Secondary | ICD-10-CM | POA: Diagnosis not present

## 2020-01-07 ENCOUNTER — Other Ambulatory Visit: Payer: Self-pay | Admitting: Cardiovascular Disease

## 2020-01-13 DIAGNOSIS — J441 Chronic obstructive pulmonary disease with (acute) exacerbation: Secondary | ICD-10-CM | POA: Diagnosis not present

## 2020-02-08 DIAGNOSIS — H6123 Impacted cerumen, bilateral: Secondary | ICD-10-CM | POA: Diagnosis not present

## 2020-02-08 DIAGNOSIS — H903 Sensorineural hearing loss, bilateral: Secondary | ICD-10-CM | POA: Diagnosis not present

## 2020-02-12 DIAGNOSIS — J441 Chronic obstructive pulmonary disease with (acute) exacerbation: Secondary | ICD-10-CM | POA: Diagnosis not present

## 2020-02-17 DIAGNOSIS — E782 Mixed hyperlipidemia: Secondary | ICD-10-CM | POA: Diagnosis not present

## 2020-02-17 DIAGNOSIS — N182 Chronic kidney disease, stage 2 (mild): Secondary | ICD-10-CM | POA: Diagnosis not present

## 2020-02-17 DIAGNOSIS — D631 Anemia in chronic kidney disease: Secondary | ICD-10-CM | POA: Diagnosis not present

## 2020-02-17 DIAGNOSIS — D508 Other iron deficiency anemias: Secondary | ICD-10-CM | POA: Diagnosis not present

## 2020-02-17 DIAGNOSIS — Z0001 Encounter for general adult medical examination with abnormal findings: Secondary | ICD-10-CM | POA: Diagnosis not present

## 2020-02-28 ENCOUNTER — Telehealth: Payer: Self-pay | Admitting: Hematology and Oncology

## 2020-02-28 NOTE — Telephone Encounter (Signed)
Received a new hem referral from Dr. Julio Sicks at [facility] for IDA. Mr. Angel [name] has been scheduled to see Dr. Leonides Schanz on 9/3 at 9am. Per pt's daughter, he needed an early morning appt on Fridays. Aware to arrive 15 minutes early.

## 2020-03-14 DIAGNOSIS — J441 Chronic obstructive pulmonary disease with (acute) exacerbation: Secondary | ICD-10-CM | POA: Diagnosis not present

## 2020-03-22 ENCOUNTER — Telehealth: Payer: Self-pay | Admitting: Hematology and Oncology

## 2020-03-22 NOTE — Telephone Encounter (Signed)
I received a call to cxl the pt's appt w/Dr. Leonides Schanz

## 2020-03-23 ENCOUNTER — Inpatient Hospital Stay: Payer: Medicare Other

## 2020-03-23 ENCOUNTER — Inpatient Hospital Stay: Payer: Medicare Other | Admitting: Hematology and Oncology

## 2020-04-05 ENCOUNTER — Ambulatory Visit: Payer: Medicare Other | Admitting: Physician Assistant

## 2020-04-09 NOTE — Progress Notes (Signed)
Cardiology Office Note:    Date:  04/10/2020   ID:  Angel [name], DOB [birth year], MRN [id number]  PCP:  Angel Mccreedy, MD  [facility] Cardiologist:  Angel Moores, MD   [facility] Electrophysiologist:  None   Referring MD: Angel Mccreedy, MD   Chief Complaint:  Follow-up (CAD)    Patient Profile:    Angel [name] is a 84 y.o. male with:   Coronary artery disease   S/p Taxus DES to LAD in 2004  Paralyzed L hemidiaphragm   Diabetes mellitus   Hypertension   Hyperlipidemia   Diastolic CHF  Mild AS  Echocardiogram 08/2018: mean 19 mmHg  COPD  BPH s/p TURP in 2018  admx in 08/2019 with gross hematuria + blood loss anemia; req'd 1 u PRBCs  Prior CV studies: Carotid US 10/21/19 Bilat ICA 1-39  Echocardiogram 09/11/2018 EF 55, Gr 1 DD, mild MAC, mean AoV 19 mmHg  Echocardiogram 08/12/16 Mild LVH, EF 55-60, no RWMA, Gr 2 DD, mild AS, Mean 10 mmHg, mod LAE, mild reduced RVSF, mild RAE, mod pulmonary hypertension   Myoview 02/20/06 Low risk, no ischemia  Cardiac catheterization 10/23/02 LM irregs LAD prox 80-90; D1 prox 80, mid 90 LCx ok RCA ok EF 50 PCI:  3 x 20 mm Taxus DES to LAD  History of Present Illness:    Angel [name] was last seen by Dr. Acie [name] in 3/21.  He returns for follow up.  He is here today with his daughter who helps some with interpretation.  Since last seen, he has been doing well without chest discomfort.  He has chronic shortness of breath that is unchanged.  He has not had lower extremity swelling or syncope.      Past Medical History:  Diagnosis Date  . Acute bronchitis   . Acute encephalopathy   . Acute exacerbation of CHF (congestive heart failure) ([place]) 09/11/2018  . Acute on chronic respiratory failure with hypoxemia ([place])   . Acute respiratory failure with hypoxia ([place]) 08/12/2016  . Acute urinary retention   . Arthritis   . Benign prostatic hypertrophy   . BPH (benign prostatic hyperplasia)   . BPH with  obstruction/lower urinary tract symptoms 05/05/2017  . CAD (coronary artery disease) 04/07/2011  . Chest pain on breathing   . Chronic diastolic CHF (congestive heart failure) ([place]) 10/03/2016  . COPD (chronic obstructive pulmonary disease) ([place])   . Coronary artery disease    status post PTCA and stenting of the left anterior descending artery  . Demand ischemia ([place])   . Diabetes mellitus ([place]) 10/06/2011  . Diaphragm paralysis   . Dyspnea    with exertion   . Hematuria 09/05/2019  . HTN (hypertension) 10/06/2011  . Hyperlipidemia   . Hypertension   . Hypertensive heart disease without heart failure   . Hypokalemia 09/11/2018  . Influenza 08/12/2016  . Influenza with pneumonia   . Nonrheumatic aortic valve stenosis 10/03/2016  . Paralyzed hemidiaphragm    Left  . Pneumonia    50 years ago  . S/P coronary artery stent placement     Current Medications: Current Meds  Medication Sig  . acetaminophen (TYLENOL) 500 MG tablet Take 2 tablets (1,000 mg total) by mouth every 6 (six) hours as needed for mild pain or headache.  . albuterol (PROVENTIL HFA;VENTOLIN HFA) 108 (90 Base) MCG/ACT inhaler Inhale 1-2 puffs into the lungs every 4 (four) hours as needed for wheezing or shortness of breath.  . clopidogrel (PLAVIX) 75 MG  tablet TAKE 1 TABLET BY MOUTH AT  BEDTIME  . finasteride (PROSCAR) 5 MG tablet Take 5 mg by mouth daily.  . furosemide (LASIX) 20 MG tablet Take 20 mg by mouth at bedtime.  Marland Kitchen glipiZIDE (GLUCOTROL XL) 2.5 MG 24 hr tablet Take by mouth.  . Methylcobalamin (B-12) 5000 MCG TBDP Take 5,000 mcg by mouth at bedtime.  . mirtazapine (REMERON) 15 MG tablet Take 15 mg by mouth at bedtime.  . nitroGLYCERIN (NITROSTAT) 0.4 MG SL tablet DISSOLVE 1 TABLET UNDER THE TONGUE EVERY 5 MINUTES AS  NEEDED FOR CHEST PAIN. MAX  OF 3 TABLETS IN 15 MINUTES. CALL 911 IF PAIN PERSISTS.  Marland Kitchen Omega-3 Fatty Acids (FISH OIL) 1200 MG CAPS Take 1,200 mg by mouth at bedtime.  . ramipril (ALTACE) 2.5 MG  capsule Take 2.5 mg by mouth at bedtime.  . rosuvastatin (CRESTOR) 20 MG tablet Take 20 mg by mouth at bedtime.  Marland Kitchen VITAMIN D PO Take 1 capsule by mouth daily.     Allergies:   Patient has no known allergies.   Social History   Tobacco Use  . Smoking status: Never Smoker  . Smokeless tobacco: Never Used  Vaping Use  . Vaping Use: Never used  Substance Use Topics  . Alcohol use: No  . Drug use: No     Family Hx: The patient's family history includes Cancer in his brother and father.  Review of Systems  Genitourinary: Negative for hematuria.     EKGs/Labs/Other Test Reviewed:    EKG:  EKG is   ordered today.  The ekg ordered today demonstrates normal sinus rhythm, heart rate 71, normal axis, nonspecific ST-T wave changes, QTC 443, no change from prior tracing  Recent Labs: 09/08/2019: BUN 20; Creatinine, Ser 1.03; Potassium 4.1; Sodium 137 09/10/2019: Hemoglobin 8.0; Platelets 307   Recent Lipid Panel Lab Results  Component Value Date/Time   CHOL 121 04/04/2019 08:31 AM   TRIG 99 04/04/2019 08:31 AM   HDL 49 04/04/2019 08:31 AM   CHOLHDL 2.5 04/04/2019 08:31 AM   CHOLHDL 3.3 02/04/2016 10:11 AM   LDLCALC 53 04/04/2019 08:31 AM    Physical Exam:    VS:  BP 126/60   Pulse 70   Ht _0  (1.6 m)   Wt 141 lb (64 kg)   SpO2 90%   BMI 24.98 kg/m     Wt Readings from Last 3 Encounters:  04/10/20 141 lb (64 kg)  10/10/19 137 lb 8 oz (62.4 kg)  09/09/19 139 lb 15.9 oz (63.5 kg)     Constitutional:      Appearance: Healthy appearance. Not in distress.  Neck:     Vascular: JVD normal.  Pulmonary:     Effort: Pulmonary effort is normal.     Breath sounds: No wheezing. No rales.     Comments: Decreased breath sounds left base Cardiovascular:     Normal rate. Regular rhythm. Normal S1. Normal S2.     Murmurs: There is a grade 2/6 crescendo-decrescendo systolic murmur at the URSB.  Edema:    Peripheral edema absent.  Abdominal:     Palpations: Abdomen is soft.    Skin:    General: Skin is warm and dry.  Neurological:     General: No focal deficit present.     Mental Status: Alert and oriented to person, place and time.     Cranial Nerves: Cranial nerves are intact.       ASSESSMENT & PLAN:    1.  Coronary artery disease involving native coronary artery of native heart without angina pectoris History of DES to the LAD in 2004.  Myoview in 2007 was low risk.  He is doing well without anginal symptoms.  Continue clopidogrel, rosuvastatin.  Follow-up in 6 months.  2. Chronic diastolic CHF (congestive heart failure) (HCC) NYHA IIb-III.  He is mainly limited by shortness of breath related to left hemidiaphragm paralysis.  Continue current dose of furosemide.  3. Essential hypertension The patient's blood pressure is controlled on his current regimen.  Continue current therapy.   4. Pure hypercholesterolemia Continue high intensity statin therapy.  Obtain follow-up CMET, lipids today.  5. Nonrheumatic aortic valve stenosis Mild by echocardiogram 08/2018.  Consider repeat echocardiogram in the next 2 to 3 years.    Dispo:  Return in about 6 months (around 10/08/2020) for Routine Follow Up, w/ Dr. Acie [name], in person.   Medication Adjustments/Labs and Tests Ordered: Current medicines are reviewed at length with the patient today.  Concerns regarding medicines are outlined above.  Tests Ordered: Orders Placed This Encounter  Procedures  . Comprehensive metabolic panel  . Lipid panel  . EKG 12-Lead   Medication Changes: No orders of the defined types were placed in this encounter.   Signed, Richardson Dopp, PA-C  04/10/2020 10:25 AM    [facility] [place], [place], [place]  [address] Phone: [phone]; Fax: [phone]

## 2020-04-10 ENCOUNTER — Ambulatory Visit: Payer: Medicare Other | Admitting: Physician Assistant

## 2020-04-10 ENCOUNTER — Other Ambulatory Visit: Payer: Self-pay

## 2020-04-10 ENCOUNTER — Encounter: Payer: Self-pay | Admitting: Physician Assistant

## 2020-04-10 VITALS — BP 126/60 | HR 70 | Ht 63.0 in | Wt 141.0 lb

## 2020-04-10 DIAGNOSIS — I5032 Chronic diastolic (congestive) heart failure: Secondary | ICD-10-CM | POA: Diagnosis not present

## 2020-04-10 DIAGNOSIS — I350 Nonrheumatic aortic (valve) stenosis: Secondary | ICD-10-CM | POA: Diagnosis not present

## 2020-04-10 DIAGNOSIS — E7800 Pure hypercholesterolemia, unspecified: Secondary | ICD-10-CM | POA: Diagnosis not present

## 2020-04-10 DIAGNOSIS — I10 Essential (primary) hypertension: Secondary | ICD-10-CM | POA: Diagnosis not present

## 2020-04-10 DIAGNOSIS — I2510 Atherosclerotic heart disease of native coronary artery without angina pectoris: Secondary | ICD-10-CM | POA: Diagnosis not present

## 2020-04-10 LAB — COMPREHENSIVE METABOLIC PANEL
ALT: 9 IU/L (ref 0–44)
AST: 16 IU/L (ref 0–40)
Albumin/Globulin Ratio: 2.5 — ABNORMAL HIGH (ref 1.2–2.2)
Albumin: 4.8 g/dL — ABNORMAL HIGH (ref 3.6–4.6)
Alkaline Phosphatase: 87 IU/L (ref 44–121)
BUN/Creatinine Ratio: 26 — ABNORMAL HIGH (ref 10–24)
BUN: 29 mg/dL — ABNORMAL HIGH (ref 8–27)
Bilirubin Total: 0.3 mg/dL (ref 0.0–1.2)
CO2: 29 mmol/L (ref 20–29)
Calcium: 9.7 mg/dL (ref 8.6–10.2)
Chloride: 98 mmol/L (ref 96–106)
Creatinine, Ser: 1.11 mg/dL (ref 0.76–1.27)
GFR calc Af Amer: 71 mL/min/{1.73_m2} (ref >59)
GFR calc non Af Amer: 62 mL/min/{1.73_m2} (ref >59)
Globulin, Total: 1.9 g/dL (ref 1.5–4.5)
Glucose: 114 mg/dL — ABNORMAL HIGH (ref 65–99)
Potassium: 4.7 mmol/L (ref 3.5–5.2)
Sodium: 138 mmol/L (ref 134–144)
Total Protein: 6.7 g/dL (ref 6.0–8.5)

## 2020-04-10 LAB — LIPID PANEL
Chol/HDL Ratio: 2.5 ratio (ref 0.0–5.0)
Cholesterol, Total: 133 mg/dL (ref 100–199)
HDL: 53 mg/dL (ref >39)
LDL Chol Calc (NIH): 55 mg/dL (ref 0–99)
Triglycerides: 148 mg/dL (ref 0–149)
VLDL Cholesterol Cal: 25 mg/dL (ref 5–40)

## 2020-04-10 NOTE — Patient Instructions (Signed)
Medication Instructions:  Your physician recommends that you continue on your current medications as directed. Please refer to the Current Medication list given to you today.  *If you need a refill on your cardiac medications before your next appointment, please call your pharmacy*  Lab Work: You will have labs drawn today: CMET/Lipids  If you have labs (blood work) drawn today and your tests are completely normal, you will receive your results only by: Marland Kitchen MyChart Message (if you have MyChart) OR . A paper copy in the mail If you have any lab test that is abnormal or we need to change your treatment, we will call you to review the results.  Testing/Procedures: None ordered today  Follow-Up: Keep follow up on 10/05/2020 at 8:00AM with Kristeen Miss, MD

## 2020-04-12 ENCOUNTER — Telehealth: Payer: Self-pay

## 2020-04-12 DIAGNOSIS — I5032 Chronic diastolic (congestive) heart failure: Secondary | ICD-10-CM

## 2020-04-12 DIAGNOSIS — I10 Essential (primary) hypertension: Secondary | ICD-10-CM

## 2020-04-12 NOTE — Telephone Encounter (Signed)
I called and left a detailed message with lab results for patients daughter Arline Asp. Advised Cindy to call back and set up repeat labs.

## 2020-04-12 NOTE — Telephone Encounter (Signed)
-----   Message from Beatrice Lecher, [place] sent at 04/10/2020  5:12 PM EDT ----- BUN increased somewhat - may be somewhat dehydrated.  Albumin elevated.  K+, LFTs normal.  Lipids optimal. PLAN:  -Change Furosemide to take in the morning and take it every other day. -Weigh daily.  Call if weight goes up 3 lbs or more in 1 day or if legs become swollen. -BMET 2 weeks. -Otherwise, continue current medications. -Follow up iwht PCP for elevated Albumin (send copy to PCP).  Tereso Newcomer, PA-C    04/10/2020 5:09 PM

## 2020-04-14 DIAGNOSIS — J441 Chronic obstructive pulmonary disease with (acute) exacerbation: Secondary | ICD-10-CM | POA: Diagnosis not present

## 2020-04-17 NOTE — Telephone Encounter (Signed)
Patients daughter Arline Asp is aware of lab results and to change Furosemide to the mornings and take every other day. Arline Asp will call if daily weights go up. Copy of labs sent to PCP. BMET scheduled for 04/27/20.

## 2020-04-17 NOTE — Addendum Note (Signed)
Addended by: Almira Bar R on: 04/17/2020 12:10 PM   Modules accepted: Orders

## 2020-04-27 ENCOUNTER — Other Ambulatory Visit: Payer: Self-pay

## 2020-04-27 ENCOUNTER — Other Ambulatory Visit: Payer: Medicare Other | Admitting: *Deleted

## 2020-04-27 DIAGNOSIS — I10 Essential (primary) hypertension: Secondary | ICD-10-CM

## 2020-04-27 DIAGNOSIS — I5032 Chronic diastolic (congestive) heart failure: Secondary | ICD-10-CM

## 2020-04-27 LAB — BASIC METABOLIC PANEL
BUN/Creatinine Ratio: 20 (ref 10–24)
BUN: 21 mg/dL (ref 8–27)
CO2: 29 mmol/L (ref 20–29)
Calcium: 9.8 mg/dL (ref 8.6–10.2)
Chloride: 101 mmol/L (ref 96–106)
Creatinine, Ser: 1.03 mg/dL (ref 0.76–1.27)
GFR calc Af Amer: 78 mL/min/{1.73_m2} (ref >59)
GFR calc non Af Amer: 67 mL/min/{1.73_m2} (ref >59)
Glucose: 132 mg/dL — ABNORMAL HIGH (ref 65–99)
Potassium: 5.1 mmol/L (ref 3.5–5.2)
Sodium: 143 mmol/L (ref 134–144)

## 2020-05-11 ENCOUNTER — Ambulatory Visit: Payer: Medicare Other

## 2020-05-11 DIAGNOSIS — N182 Chronic kidney disease, stage 2 (mild): Secondary | ICD-10-CM | POA: Diagnosis not present

## 2020-05-11 DIAGNOSIS — D631 Anemia in chronic kidney disease: Secondary | ICD-10-CM | POA: Diagnosis not present

## 2020-05-11 DIAGNOSIS — D508 Other iron deficiency anemias: Secondary | ICD-10-CM | POA: Diagnosis not present

## 2020-05-11 DIAGNOSIS — J449 Chronic obstructive pulmonary disease, unspecified: Secondary | ICD-10-CM | POA: Diagnosis not present

## 2020-05-11 DIAGNOSIS — E1151 Type 2 diabetes mellitus with diabetic peripheral angiopathy without gangrene: Secondary | ICD-10-CM | POA: Diagnosis not present

## 2020-05-11 DIAGNOSIS — E782 Mixed hyperlipidemia: Secondary | ICD-10-CM | POA: Diagnosis not present

## 2020-05-14 DIAGNOSIS — J441 Chronic obstructive pulmonary disease with (acute) exacerbation: Secondary | ICD-10-CM | POA: Diagnosis not present

## 2020-05-19 ENCOUNTER — Ambulatory Visit: Payer: Medicare Other | Attending: Internal Medicine

## 2020-05-19 DIAGNOSIS — Z23 Encounter for immunization: Secondary | ICD-10-CM

## 2020-05-19 NOTE — Progress Notes (Signed)
   Covid-19 Vaccination Clinic  Name:  Angel [name]    MRN: [id number] DOB: 11/22/1935  05/19/2020  [name] was observed post Covid-19 immunization for 15 minutes without incident. He was provided with Vaccine Information Sheet and instruction to access the V-Safe system.   Mr. Twist was instructed to call 911 with any severe reactions post vaccine: Marland Kitchen Difficulty breathing  . Swelling of face and throat  . A fast heartbeat  . A bad rash all over body  . Dizziness and weakness

## 2020-06-14 DIAGNOSIS — J441 Chronic obstructive pulmonary disease with (acute) exacerbation: Secondary | ICD-10-CM | POA: Diagnosis not present

## 2020-06-15 DIAGNOSIS — N182 Chronic kidney disease, stage 2 (mild): Secondary | ICD-10-CM | POA: Diagnosis not present

## 2020-06-19 DIAGNOSIS — N182 Chronic kidney disease, stage 2 (mild): Secondary | ICD-10-CM | POA: Diagnosis not present

## 2020-06-19 DIAGNOSIS — E1122 Type 2 diabetes mellitus with diabetic chronic kidney disease: Secondary | ICD-10-CM | POA: Diagnosis not present

## 2020-06-19 DIAGNOSIS — R7989 Other specified abnormal findings of blood chemistry: Secondary | ICD-10-CM | POA: Diagnosis not present

## 2020-06-19 DIAGNOSIS — E875 Hyperkalemia: Secondary | ICD-10-CM | POA: Diagnosis not present

## 2020-06-19 DIAGNOSIS — I129 Hypertensive chronic kidney disease with stage 1 through stage 4 chronic kidney disease, or unspecified chronic kidney disease: Secondary | ICD-10-CM | POA: Diagnosis not present

## 2020-06-27 DIAGNOSIS — R310 Gross hematuria: Secondary | ICD-10-CM | POA: Diagnosis not present

## 2020-07-14 DIAGNOSIS — J441 Chronic obstructive pulmonary disease with (acute) exacerbation: Secondary | ICD-10-CM | POA: Diagnosis not present

## 2020-08-14 DIAGNOSIS — J441 Chronic obstructive pulmonary disease with (acute) exacerbation: Secondary | ICD-10-CM | POA: Diagnosis not present

## 2020-09-14 DIAGNOSIS — J441 Chronic obstructive pulmonary disease with (acute) exacerbation: Secondary | ICD-10-CM | POA: Diagnosis not present

## 2020-10-03 ENCOUNTER — Encounter: Payer: Self-pay | Admitting: Cardiovascular Disease

## 2020-10-03 NOTE — Progress Notes (Signed)
Angel [name] Date of Birth  Jul 30, 1935    [address]    [address]     [place], [place]  [address]       Problems: 1. Coronary artery disease-status post PTCA and stenting of the LAD 2. Paralyzed left hemidiaphragm 3. Diabetes mellitus 4. Hypertension 5. Hyperlipidemia   Angel [name] is a 85 yo with the above noted hx.  No chest pain.  He remains active .  He's not had any episodes of chest pain or shortness breath. He still exercises on an intermittent basis.  Nov 29, 2012:  Angel [name] is doing well. No CP,  Breathing is the same- slightly short of breath.  He is still working 4 days a week.  He has put in a garden this spring - has tomatoes, peppers, etc.   Nov. 10, 2014:  Angel [name] is doing well.  No angina.    Dec 05, 2013:  Angel [name] is doing well.  No angina.  Staying active.    January 09, 2015:  Doing well, no episodes of angina. He does all of his normal activities.  Still gardening .    February 04, 2016:  Still working,   Still has his garden . No CP , Breathing is stable   October 03, 2016:  Angel [name] is seen today for follow up visit .  Just getting over the flu and pneumonia. Spent 10 days in hospital.   He has a paralized left hemi - diaphragm and feels about like his normal state.   Saw pulmonary a month ago  - O2 sats were 96% at that time .   No angina .   Back doing his normal activities    January 05, 2017:  Angel [name] is seen today. No cardiac issues  BP is a little low, No dizziness.   July 07, 2017:  Angel [name] is seen today for follow up visit of his CAd Remaining active.  No Angina   February 15, 2018 :  Angel [name] is seen today for follow up visit,  Seen with daughter, Stasia Cavalier.  For his CAD and hyperlipidemia. Doing his normal activitiies. Breathing is ok  BP has been on the low side. His Primary md has tried holding his ramipril but his BP went too high   August 16, 2018:  Angel [name] is seen today for follow-up of his coronary artery disease.  Also has a history of  hyperlipidemia.  Denies any chest pain or shortness of breath.  Exercises regularly.  Still does all that he wants to do.  Sept. 14, 2020 Angel [name] is seen today for follow up of his CAD and hyperlipidemia.  He denies any angina.  He has a paralyzed hemidiaphragm which limits his breathing on occasion. No limitations    October 10, 2019: Angel [name] is seen today for his coronary artery disease and hyperlipidemia.  He has a paralyzed left hemidiaphragm.  Seen with daugher, Arline Asp.  No DOE  Has developed some deafness.   Arline Asp helped to interpret.   I previous years, he did not need an interpreter.    October 05, 2020 Angel [name] is seen for follow up of his CAD  Has a hx of chronic diastolic chf    Current Outpatient Medications on File Prior to Visit  Medication Sig Dispense Refill  . acetaminophen (TYLENOL) 500 MG tablet Take 2 tablets (1,000 mg total) by mouth every 6 (six) hours as needed for mild pain or headache. 30 tablet 0  . albuterol (PROVENTIL HFA;VENTOLIN  HFA) 108 (90 Base) MCG/ACT inhaler Inhale 1-2 puffs into the lungs every 4 (four) hours as needed for wheezing or shortness of breath.    . clopidogrel (PLAVIX) 75 MG tablet TAKE 1 TABLET BY MOUTH AT  BEDTIME 90 tablet 3  . finasteride (PROSCAR) 5 MG tablet Take 5 mg by mouth daily.    . furosemide (LASIX) 20 MG tablet Take 20 mg by mouth at bedtime.    Marland Kitchen glipiZIDE (GLUCOTROL XL) 2.5 MG 24 hr tablet Take by mouth.    . Methylcobalamin (B-12) 5000 MCG TBDP Take 5,000 mcg by mouth at bedtime.    . mirtazapine (REMERON) 15 MG tablet Take 15 mg by mouth at bedtime.    . nitroGLYCERIN (NITROSTAT) 0.4 MG SL tablet DISSOLVE 1 TABLET UNDER THE TONGUE EVERY 5 MINUTES AS  NEEDED FOR CHEST PAIN. MAX  OF 3 TABLETS IN 15 MINUTES. CALL 911 IF PAIN PERSISTS. 100 tablet 3  . Omega-3 Fatty Acids (FISH OIL) 1200 MG CAPS Take 1,200 mg by mouth at bedtime.    . ramipril (ALTACE) 2.5 MG capsule Take 2.5 mg by mouth at bedtime.    . rosuvastatin (CRESTOR) 20 MG  tablet Take 20 mg by mouth at bedtime.    Marland Kitchen VITAMIN D PO Take 1 capsule by mouth daily.     No current facility-administered medications on file prior to visit.    No Known Allergies  Past Medical History:  Diagnosis Date  . Acute bronchitis   . Acute encephalopathy   . Acute exacerbation of CHF (congestive heart failure) (HCC) 09/11/2018  . Acute on chronic respiratory failure with hypoxemia (HCC)   . Acute respiratory failure with hypoxia (HCC) 08/12/2016  . Acute urinary retention   . Arthritis   . Benign prostatic hypertrophy   . BPH (benign prostatic hyperplasia)   . BPH with obstruction/lower urinary tract symptoms 05/05/2017  . CAD (coronary artery disease) 04/07/2011  . Chest pain on breathing   . Chronic diastolic CHF (congestive heart failure) (HCC) 10/03/2016  . COPD (chronic obstructive pulmonary disease) (HCC)   . Coronary artery disease    status post PTCA and stenting of the left anterior descending artery  . Demand ischemia (HCC)   . Diabetes mellitus (HCC) 10/06/2011  . Diaphragm paralysis   . Dyspnea    with exertion   . Hematuria 09/05/2019  . HTN (hypertension) 10/06/2011  . Hyperlipidemia   . Hypertension   . Hypertensive heart disease without heart failure   . Hypokalemia 09/11/2018  . Influenza 08/12/2016  . Influenza with pneumonia   . Nonrheumatic aortic valve stenosis 10/03/2016  . Paralyzed hemidiaphragm    Left  . Pneumonia    50 years ago  . S/P coronary artery stent placement     Past Surgical History:  Procedure Laterality Date  . THULIUM LASER TURP (TRANSURETHRAL RESECTION OF PROSTATE) N/A 10/30/2016   Procedure: THULIUM LASER TURP (TRANSURETHRAL RESECTION OF PROSTATE);  Surgeon: Bjorn Pippin, MD;  Location: [facility];  Service: Urology;  Laterality: N/A;  . TRANSURETHRAL RESECTION OF PROSTATE N/A 05/05/2017   Procedure: TRANSURETHRAL RESECTION OF THE PROSTATE (TURP);  Surgeon: Bjorn Pippin, MD;  Location: [facility];  Service: Urology;  Laterality: N/A;   NEEDS 60 MIN FOR PROCEDURE    Social History   Tobacco Use  Smoking Status Never Smoker  Smokeless Tobacco Never Used    Social History   Substance and Sexual Activity  Alcohol Use No    Family History  Problem Relation  Age of Onset  . Cancer Father   . Cancer Brother     Reviw of Systems:  Noted in current history Physical Exam: Blood pressure 116/70, pulse 73, height 5\' 3"  (1.6 m), weight 143 lb 12.8 oz (65.2 kg), SpO2 93 %.  GEN:  Well nourished, well developed in no acute distress HEENT: Normal NECK: No JVD; No carotid bruits LYMPHATICS: No lymphadenopathy CARDIAC: RRR 2-3/6 systolic murmur  RESPIRATORY: Good breath sounds on the right side.  He has some breath sounds in the left upper lung field.  No breath sounds in the left lower lung field. ABDOMEN: Soft, non-tender, non-distended MUSCULOSKELETAL:  No edema; No deformity  SKIN: Warm and dry NEUROLOGIC:  Alert and oriented x 3    ECG:     October 05, 2020: Normal sinus rhythm at 73.  No ST or T wave changes.    Assessment / Plan:   1. Coronary artery disease-status post PTCA and stenting of the LAD -   he is not having any episodes of chest pain.  Continue current medications.  We will continue lipid lowering strategy.    2. Paralyzed left hemidiaphragm -   he has a known paralyzed left hemidiaphragm.  He still has some air movement in his left upper lungs.  3. Diabetes mellitus -  Further plans per his primary medical team.  4. Hypertension:   Blood pressure is well controlled.  Continue current medications.   5. Hyperlipidemia   .  We will check labs today.  7. Chronic diastolic CHF :   8.  Left carotid bruit:   . Stable.      October 07, 2020, MD  10/05/2020 8:48 AM    [facility] [address] [place],  [address] [place], [place]  [place] Pager [phone] Phone: [phone]; Fax: [phone]

## 2020-10-04 ENCOUNTER — Ambulatory Visit: Payer: Medicare Other | Admitting: Cardiovascular Disease

## 2020-10-05 ENCOUNTER — Other Ambulatory Visit: Payer: Self-pay

## 2020-10-05 ENCOUNTER — Ambulatory Visit: Payer: Medicare Other | Admitting: Cardiovascular Disease

## 2020-10-05 ENCOUNTER — Encounter: Payer: Self-pay | Admitting: Cardiovascular Disease

## 2020-10-05 VITALS — BP 116/70 | HR 73 | Ht 63.0 in | Wt 143.8 lb

## 2020-10-05 DIAGNOSIS — I2510 Atherosclerotic heart disease of native coronary artery without angina pectoris: Secondary | ICD-10-CM

## 2020-10-05 LAB — LIPID PANEL
Chol/HDL Ratio: 2.7 ratio (ref 0.0–5.0)
Cholesterol, Total: 134 mg/dL (ref 100–199)
HDL: 49 mg/dL (ref >39)
LDL Chol Calc (NIH): 62 mg/dL (ref 0–99)
Triglycerides: 128 mg/dL (ref 0–149)
VLDL Cholesterol Cal: 23 mg/dL (ref 5–40)

## 2020-10-05 LAB — BASIC METABOLIC PANEL
BUN/Creatinine Ratio: 29 — ABNORMAL HIGH (ref 10–24)
BUN: 32 mg/dL — ABNORMAL HIGH (ref 8–27)
CO2: 26 mmol/L (ref 20–29)
Calcium: 9.9 mg/dL (ref 8.6–10.2)
Chloride: 98 mmol/L (ref 96–106)
Creatinine, Ser: 1.11 mg/dL (ref 0.76–1.27)
Glucose: 107 mg/dL — ABNORMAL HIGH (ref 65–99)
Potassium: 4.7 mmol/L (ref 3.5–5.2)
Sodium: 139 mmol/L (ref 134–144)
eGFR: 66 mL/min/{1.73_m2} (ref >59)

## 2020-10-05 LAB — ALT: ALT: 11 IU/L (ref 0–44)

## 2020-10-05 NOTE — Patient Instructions (Signed)
Medication Instructions:  Your physician recommends that you continue on your current medications as directed. Please refer to the Current Medication list given to you today.  *If you need a refill on your cardiac medications before your next appointment, please call your pharmacy*   Lab Work: TODAY: Lipids, ALT, BMET If you have labs (blood work) drawn today and your tests are completely normal, you will receive your results only by: Marland Kitchen MyChart Message (if you have MyChart) OR . A paper copy in the mail If you have any lab test that is abnormal or we need to change your treatment, we will call you to review the results.   Testing/Procedures: Your physician has requested that you have an echocardiogram. Echocardiography is a painless test that uses sound waves to create images of your heart. It provides your doctor with information about the size and shape of your heart and how well your heart's chambers and valves are working. This procedure takes approximately one hour. There are no restrictions for this procedure.     Follow-Up: At [facility], you and your health needs are our priority.  As part of our continuing mission to provide you with exceptional heart care, we have created designated Provider Care Teams.  These Care Teams include your primary Cardiologist (physician) and Advanced Practice Providers (APPs -  Physician Assistants and Nurse Practitioners) who all work together to provide you with the care you need, when you need it.  We recommend signing up for the patient portal called "MyChart".  Sign up information is provided on this After Visit Summary.  MyChart is used to connect with patients for Virtual Visits (Telemedicine).  Patients are able to view lab/test results, encounter notes, upcoming appointments, etc.  Non-urgent messages can be sent to your provider as well.   To learn more about what you can do with MyChart, go to [email or web address].    Your next  appointment:   1 year(s)  The format for your next appointment:   In Person  Provider:   You may see Kristeen Miss, MD or one of the following Advanced Practice Providers on your designated Care Team:    Tereso Newcomer, PA-C  [place]in [place], [place]

## 2020-10-12 DIAGNOSIS — J441 Chronic obstructive pulmonary disease with (acute) exacerbation: Secondary | ICD-10-CM | POA: Diagnosis not present

## 2020-11-02 ENCOUNTER — Ambulatory Visit (HOSPITAL_COMMUNITY): Payer: Medicare Other | Attending: Cardiology

## 2020-11-02 ENCOUNTER — Other Ambulatory Visit: Payer: Self-pay

## 2020-11-02 DIAGNOSIS — I2510 Atherosclerotic heart disease of native coronary artery without angina pectoris: Secondary | ICD-10-CM | POA: Diagnosis not present

## 2020-11-02 LAB — ECHOCARDIOGRAM COMPLETE
AR max vel: 1.6 cm2
AV Area VTI: 1.47 cm2
AV Area mean vel: 1.53 cm2
AV Mean grad: 9 mmHg
AV Peak grad: 17 mmHg
Ao pk vel: 2.06 m/s
Area-P 1/2: 4.15 cm2
S' Lateral: 3.7 cm

## 2020-11-09 DIAGNOSIS — E1151 Type 2 diabetes mellitus with diabetic peripheral angiopathy without gangrene: Secondary | ICD-10-CM | POA: Diagnosis not present

## 2020-11-09 DIAGNOSIS — I5032 Chronic diastolic (congestive) heart failure: Secondary | ICD-10-CM | POA: Diagnosis not present

## 2020-11-09 DIAGNOSIS — E782 Mixed hyperlipidemia: Secondary | ICD-10-CM | POA: Diagnosis not present

## 2020-11-09 DIAGNOSIS — I10 Essential (primary) hypertension: Secondary | ICD-10-CM | POA: Diagnosis not present

## 2020-11-12 DIAGNOSIS — J441 Chronic obstructive pulmonary disease with (acute) exacerbation: Secondary | ICD-10-CM | POA: Diagnosis not present

## 2020-11-16 DIAGNOSIS — J449 Chronic obstructive pulmonary disease, unspecified: Secondary | ICD-10-CM | POA: Diagnosis not present

## 2020-11-16 DIAGNOSIS — E1151 Type 2 diabetes mellitus with diabetic peripheral angiopathy without gangrene: Secondary | ICD-10-CM | POA: Diagnosis not present

## 2020-11-16 DIAGNOSIS — I2510 Atherosclerotic heart disease of native coronary artery without angina pectoris: Secondary | ICD-10-CM | POA: Diagnosis not present

## 2020-11-16 DIAGNOSIS — D508 Other iron deficiency anemias: Secondary | ICD-10-CM | POA: Diagnosis not present

## 2020-11-16 DIAGNOSIS — E782 Mixed hyperlipidemia: Secondary | ICD-10-CM | POA: Diagnosis not present

## 2020-11-16 DIAGNOSIS — D631 Anemia in chronic kidney disease: Secondary | ICD-10-CM | POA: Diagnosis not present

## 2020-11-16 DIAGNOSIS — N182 Chronic kidney disease, stage 2 (mild): Secondary | ICD-10-CM | POA: Diagnosis not present

## 2020-11-16 DIAGNOSIS — I5032 Chronic diastolic (congestive) heart failure: Secondary | ICD-10-CM | POA: Diagnosis not present

## 2020-11-16 DIAGNOSIS — I10 Essential (primary) hypertension: Secondary | ICD-10-CM | POA: Diagnosis not present

## 2020-11-16 DIAGNOSIS — Z0001 Encounter for general adult medical examination with abnormal findings: Secondary | ICD-10-CM | POA: Diagnosis not present

## 2020-11-19 ENCOUNTER — Ambulatory Visit: Payer: Medicare Other

## 2020-11-22 ENCOUNTER — Other Ambulatory Visit (HOSPITAL_BASED_OUTPATIENT_CLINIC_OR_DEPARTMENT_OTHER): Payer: Self-pay

## 2020-11-22 ENCOUNTER — Ambulatory Visit: Payer: Medicare Other | Attending: Internal Medicine

## 2020-11-22 ENCOUNTER — Other Ambulatory Visit: Payer: Self-pay

## 2020-11-22 DIAGNOSIS — Z23 Encounter for immunization: Secondary | ICD-10-CM

## 2020-11-22 MED ORDER — PFIZER-BIONT COVID-19 VAC-TRIS 30 MCG/0.3ML IM SUSP
INTRAMUSCULAR | 0 refills | Status: AC
  Filled 2020-11-22: qty 0.3, 1d supply, fill #0

## 2020-11-22 NOTE — Progress Notes (Signed)
   Covid-19 Vaccination Clinic  Name:  HANNA AULTMAN    MRN: [id number] DOB: 08-29-1935  11/22/2020  Mr. Mcclaren was observed post Covid-19 immunization for 15 minutes without incident. He was provided with Vaccine Information Sheet and instruction to access the V-Safe system.   Mr. Mccardle was instructed to call 911 with any severe reactions post vaccine: Marland Kitchen Difficulty breathing  . Swelling of face and throat  . A fast heartbeat  . A bad rash all over body  . Dizziness and weakness   Immunizations Administered    Name Date Dose VIS Date Route   PFIZER Comrnaty(Gray TOP) Covid-19 Vaccine 11/22/2020  1:13 PM 0.3 mL 06/28/2020 Intramuscular   Manufacturer: [employer], [employer]   Lot: [id number]   NDC: [phone]

## 2020-12-12 DIAGNOSIS — J441 Chronic obstructive pulmonary disease with (acute) exacerbation: Secondary | ICD-10-CM | POA: Diagnosis not present

## 2020-12-23 ENCOUNTER — Other Ambulatory Visit: Payer: Self-pay | Admitting: Cardiovascular Disease

## 2021-01-12 DIAGNOSIS — J441 Chronic obstructive pulmonary disease with (acute) exacerbation: Secondary | ICD-10-CM | POA: Diagnosis not present

## 2021-02-11 DIAGNOSIS — J441 Chronic obstructive pulmonary disease with (acute) exacerbation: Secondary | ICD-10-CM | POA: Diagnosis not present

## 2021-03-08 DIAGNOSIS — I5032 Chronic diastolic (congestive) heart failure: Secondary | ICD-10-CM | POA: Diagnosis not present

## 2021-03-08 DIAGNOSIS — D631 Anemia in chronic kidney disease: Secondary | ICD-10-CM | POA: Diagnosis not present

## 2021-03-08 DIAGNOSIS — I10 Essential (primary) hypertension: Secondary | ICD-10-CM | POA: Diagnosis not present

## 2021-03-08 DIAGNOSIS — E782 Mixed hyperlipidemia: Secondary | ICD-10-CM | POA: Diagnosis not present

## 2021-03-08 DIAGNOSIS — I2510 Atherosclerotic heart disease of native coronary artery without angina pectoris: Secondary | ICD-10-CM | POA: Diagnosis not present

## 2021-03-08 DIAGNOSIS — J449 Chronic obstructive pulmonary disease, unspecified: Secondary | ICD-10-CM | POA: Diagnosis not present

## 2021-03-08 DIAGNOSIS — N182 Chronic kidney disease, stage 2 (mild): Secondary | ICD-10-CM | POA: Diagnosis not present

## 2021-03-08 DIAGNOSIS — E1151 Type 2 diabetes mellitus with diabetic peripheral angiopathy without gangrene: Secondary | ICD-10-CM | POA: Diagnosis not present

## 2021-03-08 DIAGNOSIS — M2550 Pain in unspecified joint: Secondary | ICD-10-CM | POA: Diagnosis not present

## 2021-03-08 DIAGNOSIS — D508 Other iron deficiency anemias: Secondary | ICD-10-CM | POA: Diagnosis not present

## 2021-03-14 DIAGNOSIS — J441 Chronic obstructive pulmonary disease with (acute) exacerbation: Secondary | ICD-10-CM | POA: Diagnosis not present

## 2021-04-14 DIAGNOSIS — J441 Chronic obstructive pulmonary disease with (acute) exacerbation: Secondary | ICD-10-CM | POA: Diagnosis not present

## 2021-05-14 DIAGNOSIS — J441 Chronic obstructive pulmonary disease with (acute) exacerbation: Secondary | ICD-10-CM | POA: Diagnosis not present

## 2021-05-29 DIAGNOSIS — D508 Other iron deficiency anemias: Secondary | ICD-10-CM | POA: Diagnosis not present

## 2021-05-29 DIAGNOSIS — Z0001 Encounter for general adult medical examination with abnormal findings: Secondary | ICD-10-CM | POA: Diagnosis not present

## 2021-05-29 DIAGNOSIS — E1151 Type 2 diabetes mellitus with diabetic peripheral angiopathy without gangrene: Secondary | ICD-10-CM | POA: Diagnosis not present

## 2021-05-29 DIAGNOSIS — N182 Chronic kidney disease, stage 2 (mild): Secondary | ICD-10-CM | POA: Diagnosis not present

## 2021-05-29 DIAGNOSIS — E782 Mixed hyperlipidemia: Secondary | ICD-10-CM | POA: Diagnosis not present

## 2021-05-29 DIAGNOSIS — I2510 Atherosclerotic heart disease of native coronary artery without angina pectoris: Secondary | ICD-10-CM | POA: Diagnosis not present

## 2021-05-29 DIAGNOSIS — I5032 Chronic diastolic (congestive) heart failure: Secondary | ICD-10-CM | POA: Diagnosis not present

## 2021-05-29 DIAGNOSIS — D631 Anemia in chronic kidney disease: Secondary | ICD-10-CM | POA: Diagnosis not present

## 2021-05-29 DIAGNOSIS — M2550 Pain in unspecified joint: Secondary | ICD-10-CM | POA: Diagnosis not present

## 2021-05-29 DIAGNOSIS — I10 Essential (primary) hypertension: Secondary | ICD-10-CM | POA: Diagnosis not present

## 2021-05-29 DIAGNOSIS — J449 Chronic obstructive pulmonary disease, unspecified: Secondary | ICD-10-CM | POA: Diagnosis not present

## 2021-05-29 DIAGNOSIS — Z2821 Immunization not carried out because of patient refusal: Secondary | ICD-10-CM | POA: Diagnosis not present

## 2021-06-14 DIAGNOSIS — J441 Chronic obstructive pulmonary disease with (acute) exacerbation: Secondary | ICD-10-CM | POA: Diagnosis not present

## 2021-06-14 DIAGNOSIS — N182 Chronic kidney disease, stage 2 (mild): Secondary | ICD-10-CM | POA: Diagnosis not present

## 2021-06-21 DIAGNOSIS — R7989 Other specified abnormal findings of blood chemistry: Secondary | ICD-10-CM | POA: Diagnosis not present

## 2021-06-21 DIAGNOSIS — N182 Chronic kidney disease, stage 2 (mild): Secondary | ICD-10-CM | POA: Diagnosis not present

## 2021-06-21 DIAGNOSIS — I129 Hypertensive chronic kidney disease with stage 1 through stage 4 chronic kidney disease, or unspecified chronic kidney disease: Secondary | ICD-10-CM | POA: Diagnosis not present

## 2021-06-21 DIAGNOSIS — E875 Hyperkalemia: Secondary | ICD-10-CM | POA: Diagnosis not present

## 2021-06-21 DIAGNOSIS — E1122 Type 2 diabetes mellitus with diabetic chronic kidney disease: Secondary | ICD-10-CM | POA: Diagnosis not present

## 2021-06-28 ENCOUNTER — Ambulatory Visit: Payer: Medicare Other

## 2021-06-28 DIAGNOSIS — J449 Chronic obstructive pulmonary disease, unspecified: Secondary | ICD-10-CM | POA: Diagnosis not present

## 2021-06-28 DIAGNOSIS — I10 Essential (primary) hypertension: Secondary | ICD-10-CM | POA: Diagnosis not present

## 2021-06-28 DIAGNOSIS — M2550 Pain in unspecified joint: Secondary | ICD-10-CM | POA: Diagnosis not present

## 2021-06-28 DIAGNOSIS — N182 Chronic kidney disease, stage 2 (mild): Secondary | ICD-10-CM | POA: Diagnosis not present

## 2021-06-28 DIAGNOSIS — I5032 Chronic diastolic (congestive) heart failure: Secondary | ICD-10-CM | POA: Diagnosis not present

## 2021-06-28 DIAGNOSIS — E1151 Type 2 diabetes mellitus with diabetic peripheral angiopathy without gangrene: Secondary | ICD-10-CM | POA: Diagnosis not present

## 2021-06-28 DIAGNOSIS — I2510 Atherosclerotic heart disease of native coronary artery without angina pectoris: Secondary | ICD-10-CM | POA: Diagnosis not present

## 2021-06-28 DIAGNOSIS — D631 Anemia in chronic kidney disease: Secondary | ICD-10-CM | POA: Diagnosis not present

## 2021-06-28 DIAGNOSIS — E782 Mixed hyperlipidemia: Secondary | ICD-10-CM | POA: Diagnosis not present

## 2021-07-01 ENCOUNTER — Other Ambulatory Visit: Payer: Self-pay | Admitting: Cardiovascular Disease

## 2021-07-14 DIAGNOSIS — J441 Chronic obstructive pulmonary disease with (acute) exacerbation: Secondary | ICD-10-CM | POA: Diagnosis not present

## 2021-07-17 DIAGNOSIS — Z743 Need for continuous supervision: Secondary | ICD-10-CM | POA: Diagnosis not present

## 2021-07-17 DIAGNOSIS — R404 Transient alteration of awareness: Secondary | ICD-10-CM | POA: Diagnosis not present

## 2021-07-17 DIAGNOSIS — R4020 Unspecified coma: Secondary | ICD-10-CM | POA: Diagnosis not present

## 2021-07-21 DIAGNOSIS — 419620001 Death: Secondary | SNOMED CT | POA: Diagnosis not present

## 2021-07-21 DEATH — deceased

## 2021-10-04 ENCOUNTER — Ambulatory Visit: Payer: Medicare Other | Admitting: Cardiovascular Disease
